# Patient Record
Sex: Female | Born: 1935 | Race: White | Hispanic: No | Marital: Married | Smoking: Never smoker
Health system: Southern US, Community
[De-identification: ages and names within clinical notes are randomized; demographics above are authoritative.]

## PROBLEM LIST (undated history)

## (undated) DIAGNOSIS — Z8719 Personal history of other diseases of the digestive system: Secondary | ICD-10-CM

## (undated) DIAGNOSIS — J45909 Unspecified asthma, uncomplicated: Secondary | ICD-10-CM

## (undated) DIAGNOSIS — J449 Chronic obstructive pulmonary disease, unspecified: Secondary | ICD-10-CM

## (undated) DIAGNOSIS — K219 Gastro-esophageal reflux disease without esophagitis: Secondary | ICD-10-CM

## (undated) DIAGNOSIS — C689 Malignant neoplasm of urinary organ, unspecified: Secondary | ICD-10-CM

## (undated) DIAGNOSIS — C801 Malignant (primary) neoplasm, unspecified: Secondary | ICD-10-CM

## (undated) HISTORY — PX: CATARACT EXTRACTION W/ INTRAOCULAR LENS  IMPLANT, BILATERAL: SHX1307

## (undated) HISTORY — PX: ABDOMINAL HYSTERECTOMY: SHX81

## (undated) HISTORY — PX: CHOLECYSTECTOMY: SHX55

## (undated) HISTORY — PX: THYROIDECTOMY: SHX17

## (undated) HISTORY — PX: OTHER SURGICAL HISTORY: SHX169

## (undated) HISTORY — PX: TONSILLECTOMY: SUR1361

## (undated) HISTORY — PX: APPENDECTOMY: SHX54

## (undated) HISTORY — PX: THYROID SURGERY: SHX805

## (undated) HISTORY — PX: COLON SURGERY: SHX602

---

## 1976-03-13 DIAGNOSIS — C801 Malignant (primary) neoplasm, unspecified: Secondary | ICD-10-CM

## 1976-03-13 HISTORY — DX: Malignant (primary) neoplasm, unspecified: C80.1

## 2012-06-13 ENCOUNTER — Ambulatory Visit: Payer: Self-pay | Admitting: Internal Medicine

## 2012-07-22 ENCOUNTER — Ambulatory Visit: Payer: Self-pay | Admitting: Unknown Physician Specialty

## 2013-04-30 ENCOUNTER — Emergency Department: Payer: Self-pay | Admitting: Emergency Medicine

## 2013-04-30 LAB — CBC
HCT: 39.1 % (ref 35.0–47.0)
HGB: 12.9 g/dL (ref 12.0–16.0)
MCH: 30.8 pg (ref 26.0–34.0)
MCHC: 33.1 g/dL (ref 32.0–36.0)
MCV: 93 fL (ref 80–100)
Platelet: 229 10*3/uL (ref 150–440)
RBC: 4.2 10*6/uL (ref 3.80–5.20)
RDW: 12.2 % (ref 11.5–14.5)
WBC: 7.6 10*3/uL (ref 3.6–11.0)

## 2013-04-30 LAB — BASIC METABOLIC PANEL
ANION GAP: 4 — AB (ref 7–16)
BUN: 25 mg/dL — AB (ref 7–18)
CREATININE: 1.01 mg/dL (ref 0.60–1.30)
Calcium, Total: 8.9 mg/dL (ref 8.5–10.1)
Chloride: 107 mmol/L (ref 98–107)
Co2: 29 mmol/L (ref 21–32)
EGFR (Non-African Amer.): 50 — ABNORMAL LOW
GFR CALC AF AMER: 58 — AB
Glucose: 126 mg/dL — ABNORMAL HIGH (ref 65–99)
OSMOLALITY: 285 (ref 275–301)
Potassium: 3.8 mmol/L (ref 3.5–5.1)
SODIUM: 140 mmol/L (ref 136–145)

## 2013-04-30 LAB — TROPONIN I: Troponin-I: <0.02 ng/mL

## 2014-03-18 ENCOUNTER — Ambulatory Visit: Payer: Self-pay | Admitting: Physician Assistant

## 2014-07-20 ENCOUNTER — Other Ambulatory Visit: Payer: Self-pay | Admitting: Internal Medicine

## 2014-07-20 DIAGNOSIS — R600 Localized edema: Secondary | ICD-10-CM

## 2014-07-24 ENCOUNTER — Ambulatory Visit
Admission: RE | Admit: 2014-07-24 | Discharge: 2014-07-24 | Disposition: A | Discharge status: Home / Self Care | Payer: Medicare Other | Source: Ambulatory Visit | Attending: Internal Medicine | Admitting: Internal Medicine

## 2014-07-24 DIAGNOSIS — R600 Localized edema: Secondary | ICD-10-CM | POA: Diagnosis present

## 2015-02-22 ENCOUNTER — Emergency Department: Payer: Medicare Other

## 2015-02-22 ENCOUNTER — Emergency Department
Admission: EM | Admit: 2015-02-22 | Discharge: 2015-02-22 | Disposition: A | Discharge status: Home / Self Care | Payer: Medicare Other | Attending: Emergency Medicine | Admitting: Emergency Medicine

## 2015-02-22 ENCOUNTER — Encounter: Payer: Self-pay | Admitting: Emergency Medicine

## 2015-02-22 DIAGNOSIS — J449 Chronic obstructive pulmonary disease, unspecified: Secondary | ICD-10-CM | POA: Insufficient documentation

## 2015-02-22 DIAGNOSIS — J329 Chronic sinusitis, unspecified: Secondary | ICD-10-CM | POA: Diagnosis not present

## 2015-02-22 DIAGNOSIS — R51 Headache: Secondary | ICD-10-CM | POA: Diagnosis present

## 2015-02-22 DIAGNOSIS — N3090 Cystitis, unspecified without hematuria: Secondary | ICD-10-CM

## 2015-02-22 DIAGNOSIS — J40 Bronchitis, not specified as acute or chronic: Secondary | ICD-10-CM

## 2015-02-22 HISTORY — DX: Unspecified asthma, uncomplicated: J45.909

## 2015-02-22 HISTORY — DX: Gastro-esophageal reflux disease without esophagitis: K21.9

## 2015-02-22 HISTORY — DX: Chronic obstructive pulmonary disease, unspecified: J44.9

## 2015-02-22 LAB — COMPREHENSIVE METABOLIC PANEL
ALT: 20 U/L (ref 14–54)
ANION GAP: 7 (ref 5–15)
AST: 24 U/L (ref 15–41)
Albumin: 3.5 g/dL (ref 3.5–5.0)
Alkaline Phosphatase: 102 U/L (ref 38–126)
BUN: 22 mg/dL — ABNORMAL HIGH (ref 6–20)
CHLORIDE: 102 mmol/L (ref 101–111)
CO2: 25 mmol/L (ref 22–32)
Calcium: 8.4 mg/dL — ABNORMAL LOW (ref 8.9–10.3)
Creatinine, Ser: 0.9 mg/dL (ref 0.44–1.00)
GFR, EST NON AFRICAN AMERICAN: 55 mL/min — AB (ref >60)
Glucose, Bld: 122 mg/dL — ABNORMAL HIGH (ref 65–99)
POTASSIUM: 3.8 mmol/L (ref 3.5–5.1)
SODIUM: 134 mmol/L — AB (ref 135–145)
Total Bilirubin: 0.6 mg/dL (ref 0.3–1.2)
Total Protein: 6.8 g/dL (ref 6.5–8.1)

## 2015-02-22 LAB — URINALYSIS COMPLETE WITH MICROSCOPIC (ARMC ONLY)
BILIRUBIN URINE: NEGATIVE
Glucose, UA: NEGATIVE mg/dL
Nitrite: NEGATIVE
PH: 5 (ref 5.0–8.0)
PROTEIN: 30 mg/dL — AB
Specific Gravity, Urine: 1.021 (ref 1.005–1.030)

## 2015-02-22 LAB — CBC
HEMATOCRIT: 37.9 % (ref 35.0–47.0)
HEMOGLOBIN: 12.7 g/dL (ref 12.0–16.0)
MCH: 30.5 pg (ref 26.0–34.0)
MCHC: 33.5 g/dL (ref 32.0–36.0)
MCV: 91.3 fL (ref 80.0–100.0)
PLATELETS: 231 10*3/uL (ref 150–440)
RBC: 4.15 MIL/uL (ref 3.80–5.20)
RDW: 12.3 % (ref 11.5–14.5)
WBC: 16.8 10*3/uL — AB (ref 3.6–11.0)

## 2015-02-22 MED ORDER — AMOXICILLIN-POT CLAVULANATE 875-125 MG PO TABS
1.0000 | ORAL_TABLET | Freq: Once | ORAL | Status: AC
  Administered 2015-02-22: 1 via ORAL
  Filled 2015-02-22: qty 1

## 2015-02-22 MED ORDER — AMOXICILLIN-POT CLAVULANATE 875-125 MG PO TABS: 1.0000 | ORAL_TABLET | Freq: Two times a day (BID) | ORAL | Status: AC

## 2015-02-22 NOTE — ED Notes (Signed)
Applied face mask.

## 2015-02-22 NOTE — ED Provider Notes (Addendum)
[facility] Emergency Department Provider Note     Time seen: ----------------------------------------- 6:57 PM on 02/22/2015 -----------------------------------------    I have reviewed the triage vital signs and the nursing notes.   HISTORY  Chief Complaint Headache; Nasal Congestion; Abdominal Pain; and Shortness of Breath    HPI Kristi Thompson is a 81 y.o. female who presents to ER with fever, chills, cough, mild congestion, epigastric pain and mainly headache. Her symptoms been going on for several days, nothing makes it better or worse. She's recently placed on Zyrtec, she denies a history of this in the past. She had an episode last night where she was a little disoriented she states. Currently the symptoms have resolved.   Past Medical History  Diagnosis Date  . Asthma   . GERD (gastroesophageal reflux disease)   . COPD (chronic obstructive pulmonary disease) (HCC)     There are no active problems to display for this patient.   Past Surgical History  Procedure Laterality Date  . Abdominal hysterectomy    . Bowel obstruction    . Colon surgery      colon cancer    Allergies Demerol and Sulfa antibiotics  Social History Social History  Substance Use Topics  . Smoking status: Never Smoker   . Smokeless tobacco: None  . Alcohol Use: Yes    Review of Systems Constitutional: Positive for fever and chills. Eyes: Negative for visual changes. ENT: Positive for congestion Cardiovascular: Negative for chest pain. Respiratory: Negative for shortness of breath. Positive for cough Gastrointestinal: Negative for abdominal pain, vomiting and diarrhea. Genitourinary: Negative for dysuria. Musculoskeletal: Negative for back pain. Skin: Negative for rash. Neurological: Positive for headache  10-point ROS otherwise negative.  ____________________________________________   PHYSICAL EXAM:  VITAL SIGNS: ED Triage Vitals  Enc Vitals  Group     BP 02/22/15 1717 135/57 mmHg     Pulse Rate 02/22/15 1717 85     Resp 02/22/15 1717 18     Temp 02/22/15 1717 98.8 F (37.1 C)     Temp Source 02/22/15 1717 Oral     SpO2 02/22/15 1717 94 %     Weight 02/22/15 1717 144 lb (65.318 kg)     Height 02/22/15 1717 5\' 1"  (1.549 m)     Head Cir --      Peak Flow --      Pain Score --      Pain Loc --      Pain Edu? --      Excl. in [place]? --     Constitutional: Alert and oriented. Well appearing and in no distress. Eyes: Conjunctivae are normal. PERRL. Normal extraocular movements. ENT   Head: Normocephalic and atraumatic.   Nose: No congestion/rhinnorhea. Mild tenderness over the maxillary sinuses   Mouth/Throat: Mucous membranes are moist.   Neck: No stridor. Cardiovascular: Normal rate, regular rhythm. Normal and symmetric distal pulses are present in all extremities. No murmurs, rubs, or gallops. Respiratory: Normal respiratory effort without tachypnea nor retractions. Breath sounds are clear and equal bilaterally. No wheezes/rales/rhonchi. Gastrointestinal: Soft and nontender. No distention. No abdominal bruits.  Musculoskeletal: Nontender with normal range of motion in all extremities. No joint effusions.  No lower extremity tenderness nor edema. Neurologic:  Normal speech and language. No gross focal neurologic deficits are appreciated. Speech is normal. No gait instability. Skin:  Skin is warm, dry and intact. No rash noted. Psychiatric: Mood and affect are normal. Speech and behavior are normal. Patient exhibits appropriate  insight and judgment. ____________________________________________  ED COURSE:  Pertinent labs & imaging results that were available during my care of the patient were reviewed by me and considered in my medical decision making (see chart for details). She is in no acute distress, likely URI or sinusitis. Patient may need CT imaging ____________________________________________    LABS  (pertinent positives/negatives)  Labs Reviewed  COMPREHENSIVE METABOLIC PANEL - Abnormal; Notable for the following:    Sodium 134 (*)    Glucose, Bld 122 (*)    BUN 22 (*)    Calcium 8.4 (*)    GFR calc non Af Amer 55 (*)    All other components within normal limits  CBC - Abnormal; Notable for the following:    WBC 16.8 (*)    All other components within normal limits  URINALYSIS COMPLETEWITH MICROSCOPIC ([facility] ONLY) - Abnormal; Notable for the following:    Color, Urine YELLOW (*)    APPearance CLOUDY (*)    Ketones, ur TRACE (*)    Hgb urine dipstick 2+ (*)    Protein, ur 30 (*)    Leukocytes, UA 1+ (*)    Bacteria, UA RARE (*)    Squamous Epithelial / LPF 6-30 (*)    All other components within normal limits   EKG: Interpreted by me, normal sinus rhythm, incomplete right bundle branch block, nonspecific ST and T wave changes, borderline long QT. Otherwise unremarkable.  RADIOLOGY Images were viewed by me  CT head IMPRESSION: No acute intracranial abnormality. IMPRESSION: Central interstitial changes and peribronchial changes suggesting bronchitis. Linear atelectasis in the left lung base with fluid or thickened pleura in the left costophrenic angle. No focal consolidation. ____________________________________________  FINAL ASSESSMENT AND PLAN  Sinusitis, bronchitis, cystitis  Plan: Patient with labs and imaging as dictated above. Patient be started on Augmentin which should cover the infections listed above. She is encouraged to have follow-up with Dr. Wilburt Finlay 2 days for recheck. She is remarkably healthy for an 81 year old and agrees to outpatient follow-up.   Earleen Newport, MD   Earleen Newport, MD 02/22/15 Lattie Corns  Earleen Newport, MD 02/22/15 [phone]

## 2015-02-22 NOTE — Discharge Instructions (Signed)
Sinusitis, Adult Sinusitis is redness, soreness, and puffiness (inflammation) of the air pockets in the bones of your face (sinuses). The redness, soreness, and puffiness can cause air and mucus to get trapped in your sinuses. This can allow germs to grow and cause an infection.  HOME CARE   Drink enough fluids to keep your pee (urine) clear or pale yellow.  Use a humidifier in your home.  Run a hot shower to create steam in the bathroom. Sit in the bathroom with the door closed. Breathe in the steam 3-4 times a day.  Put a warm, moist washcloth on your face 3-4 times a day, or as told by your doctor.  Use salt water sprays (saline sprays) to wet the thick fluid in your nose. This can help the sinuses drain.  Only take medicine as told by your doctor. GET HELP RIGHT AWAY IF:   Your pain gets worse.  You have very bad headaches.  You are sick to your stomach (nauseous).  You throw up (vomit).  You are very sleepy (drowsy) all the time.  Your face is puffy (swollen).  Your vision changes.  You have a stiff neck.  You have trouble breathing. MAKE SURE YOU:   Understand these instructions.  Will watch your condition.  Will get help right away if you are not doing well or get worse.   This information is not intended to replace advice given to you by your health care provider. Make sure you discuss any questions you have with your health care provider.   Document Released: 08/16/2007 Document Revised: 03/20/2014 Document Reviewed: 10/03/2011 Elsevier Interactive Patient Education 2016 Elsevier Inc. Urinary Tract Infection Urinary tract infections (UTIs) can develop anywhere along your urinary tract. Your urinary tract is your body's drainage system for removing wastes and extra water. Your urinary tract includes two kidneys, two ureters, a bladder, and a urethra. Your kidneys are a pair of bean-shaped organs. Each kidney is about the size of your fist. They are located  below your ribs, one on each side of your spine. CAUSES Infections are caused by microbes, which are microscopic organisms, including fungi, viruses, and bacteria. These organisms are so small that they can only be seen through a microscope. Bacteria are the microbes that most commonly cause UTIs. SYMPTOMS  Symptoms of UTIs may vary by age and gender of the patient and by the location of the infection. Symptoms in young women typically include a frequent and intense urge to urinate and a painful, burning feeling in the bladder or urethra during urination. Older women and men are more likely to be tired, shaky, and weak and have muscle aches and abdominal pain. A fever may mean the infection is in your kidneys. Other symptoms of a kidney infection include pain in your back or sides below the ribs, nausea, and vomiting. DIAGNOSIS To diagnose a UTI, your caregiver will ask you about your symptoms. Your caregiver will also ask you to provide a urine sample. The urine sample will be tested for bacteria and white blood cells. White blood cells are made by your body to help fight infection. TREATMENT  Typically, UTIs can be treated with medication. Because most UTIs are caused by a bacterial infection, they usually can be treated with the use of antibiotics. The choice of antibiotic and length of treatment depend on your symptoms and the type of bacteria causing your infection. HOME CARE INSTRUCTIONS  If you were prescribed antibiotics, take them exactly as your caregiver instructs  you. Elizebeth Koller the medication even if you feel better after you have only taken some of the medication.  Drink enough water and fluids to keep your urine clear or pale yellow.  Avoid caffeine, tea, and carbonated beverages. They tend to irritate your bladder.  Empty your bladder often. Avoid holding urine for long periods of time.  Empty your bladder before and after sexual intercourse.  After a bowel movement, women should  cleanse from front to back. Use each tissue only once. SEEK MEDICAL CARE IF:   You have back pain.  You develop a fever.  Your symptoms do not begin to resolve within 3 days. SEEK IMMEDIATE MEDICAL CARE IF:   You have severe back pain or lower abdominal pain.  You develop chills.  You have nausea or vomiting.  You have continued burning or discomfort with urination. MAKE SURE YOU:   Understand these instructions.  Will watch your condition.  Will get help right away if you are not doing well or get worse.   This information is not intended to replace advice given to you by your health care provider. Make sure you discuss any questions you have with your health care provider.   Document Released: 12/07/2004 Document Revised: 11/18/2014 Document Reviewed: 04/07/2011 Elsevier Interactive Patient Education 2016 Elsevier Inc. Acute Bronchitis Bronchitis is inflammation of the airways that extend from the windpipe into the lungs (bronchi). The inflammation often causes mucus to develop. This leads to a cough, which is the most common symptom of bronchitis.  In acute bronchitis, the condition usually develops suddenly and goes away over time, usually in a couple weeks. Smoking, allergies, and asthma can make bronchitis worse. Repeated episodes of bronchitis may cause further lung problems.  CAUSES Acute bronchitis is most often caused by the same virus that causes a cold. The virus can spread from person to person (contagious) through coughing, sneezing, and touching contaminated objects. SIGNS AND SYMPTOMS   Cough.   Fever.   Coughing up mucus.   Body aches.   Chest congestion.   Chills.   Shortness of breath.   Sore throat.  DIAGNOSIS  Acute bronchitis is usually diagnosed through a physical exam. Your health care provider will also ask you questions about your medical history. Tests, such as chest X-rays, are sometimes done to rule out other conditions.    TREATMENT  Acute bronchitis usually goes away in a couple weeks. Oftentimes, no medical treatment is necessary. Medicines are sometimes given for relief of fever or cough. Antibiotic medicines are usually not needed but may be prescribed in certain situations. In some cases, an inhaler may be recommended to help reduce shortness of breath and control the cough. A cool mist vaporizer may also be used to help thin bronchial secretions and make it easier to clear the chest.  HOME CARE INSTRUCTIONS  Get plenty of rest.   Drink enough fluids to keep your urine clear or pale yellow (unless you have a medical condition that requires fluid restriction). Increasing fluids may help thin your respiratory secretions (sputum) and reduce chest congestion, and it will prevent dehydration.   Take medicines only as directed by your health care provider.  If you were prescribed an antibiotic medicine, finish it all even if you start to feel better.  Avoid smoking and secondhand smoke. Exposure to cigarette smoke or irritating chemicals will make bronchitis worse. If you are a smoker, consider using nicotine gum or skin patches to help control withdrawal symptoms. Quitting smoking will help  your lungs heal faster.   Reduce the chances of another bout of acute bronchitis by washing your hands frequently, avoiding people with cold symptoms, and trying not to touch your hands to your mouth, nose, or eyes.   Keep all follow-up visits as directed by your health care provider.  SEEK MEDICAL CARE IF: Your symptoms do not improve after 1 week of treatment.  SEEK IMMEDIATE MEDICAL CARE IF:  You develop an increased fever or chills.   You have chest pain.   You have severe shortness of breath.  You have bloody sputum.   You develop dehydration.  You faint or repeatedly feel like you are going to pass out.  You develop repeated vomiting.  You develop a severe headache. MAKE SURE YOU:   Understand  these instructions.  Will watch your condition.  Will get help right away if you are not doing well or get worse.   This information is not intended to replace advice given to you by your health care provider. Make sure you discuss any questions you have with your health care provider.   Document Released: 04/06/2004 Document Revised: 03/20/2014 Document Reviewed: 08/20/2012 Elsevier Interactive Patient Education [employer].

## 2015-02-22 NOTE — ED Notes (Signed)
Pt presents to ER headache, pain near sternum where hernia is. Pt has chills, dry cough, intermittent fevers since yesterday afternoon. Facial tenderness.Pt alert and oriented.

## 2015-10-11 DIAGNOSIS — C44310 Basal cell carcinoma of skin of unspecified parts of face: Secondary | ICD-10-CM | POA: Insufficient documentation

## 2016-07-25 ENCOUNTER — Other Ambulatory Visit: Payer: Self-pay | Admitting: Physician Assistant

## 2016-07-25 DIAGNOSIS — J40 Bronchitis, not specified as acute or chronic: Secondary | ICD-10-CM

## 2016-07-25 DIAGNOSIS — J431 Panlobular emphysema: Secondary | ICD-10-CM

## 2017-03-13 DIAGNOSIS — C689 Malignant neoplasm of urinary organ, unspecified: Secondary | ICD-10-CM

## 2017-03-13 HISTORY — DX: Malignant neoplasm of urinary organ, unspecified: C68.9

## 2017-05-23 DIAGNOSIS — N183 Chronic kidney disease, stage 3 unspecified: Secondary | ICD-10-CM | POA: Insufficient documentation

## 2017-12-19 DIAGNOSIS — I10 Essential (primary) hypertension: Secondary | ICD-10-CM | POA: Insufficient documentation

## 2017-12-20 ENCOUNTER — Other Ambulatory Visit: Payer: Self-pay | Admitting: Internal Medicine

## 2017-12-20 DIAGNOSIS — R1032 Left lower quadrant pain: Secondary | ICD-10-CM

## 2017-12-27 ENCOUNTER — Other Ambulatory Visit: Payer: Self-pay | Admitting: Internal Medicine

## 2017-12-27 ENCOUNTER — Ambulatory Visit
Admission: RE | Admit: 2017-12-27 | Discharge: 2017-12-27 | Disposition: A | Discharge status: Home / Self Care | Payer: Medicare Other | Source: Ambulatory Visit | Attending: Internal Medicine | Admitting: Internal Medicine

## 2017-12-27 DIAGNOSIS — R1032 Left lower quadrant pain: Secondary | ICD-10-CM

## 2017-12-27 DIAGNOSIS — N131 Hydronephrosis with ureteral stricture, not elsewhere classified: Secondary | ICD-10-CM | POA: Diagnosis not present

## 2017-12-27 DIAGNOSIS — R590 Localized enlarged lymph nodes: Secondary | ICD-10-CM | POA: Diagnosis not present

## 2017-12-27 HISTORY — DX: Malignant (primary) neoplasm, unspecified: C80.1

## 2017-12-27 LAB — POCT I-STAT CREATININE: CREATININE: 1.7 mg/dL — AB (ref 0.44–1.00)

## 2017-12-31 ENCOUNTER — Other Ambulatory Visit: Payer: Self-pay | Admitting: Student

## 2017-12-31 DIAGNOSIS — R933 Abnormal findings on diagnostic imaging of other parts of digestive tract: Secondary | ICD-10-CM

## 2018-01-04 ENCOUNTER — Ambulatory Visit
Admission: RE | Admit: 2018-01-04 | Discharge: 2018-01-04 | Disposition: A | Discharge status: Home / Self Care | Payer: Medicare Other | Source: Ambulatory Visit | Attending: Student | Admitting: Student

## 2018-01-04 DIAGNOSIS — R933 Abnormal findings on diagnostic imaging of other parts of digestive tract: Secondary | ICD-10-CM | POA: Insufficient documentation

## 2018-01-04 DIAGNOSIS — J90 Pleural effusion, not elsewhere classified: Secondary | ICD-10-CM | POA: Diagnosis not present

## 2018-01-04 DIAGNOSIS — N131 Hydronephrosis with ureteral stricture, not elsewhere classified: Secondary | ICD-10-CM | POA: Diagnosis not present

## 2018-01-04 DIAGNOSIS — I2510 Atherosclerotic heart disease of native coronary artery without angina pectoris: Secondary | ICD-10-CM | POA: Diagnosis not present

## 2018-01-04 DIAGNOSIS — I517 Cardiomegaly: Secondary | ICD-10-CM | POA: Insufficient documentation

## 2018-01-04 DIAGNOSIS — D4959 Neoplasm of unspecified behavior of other genitourinary organ: Secondary | ICD-10-CM | POA: Insufficient documentation

## 2018-01-04 DIAGNOSIS — J320 Chronic maxillary sinusitis: Secondary | ICD-10-CM | POA: Diagnosis not present

## 2018-01-04 DIAGNOSIS — I700 Atherosclerosis of aorta: Secondary | ICD-10-CM | POA: Diagnosis not present

## 2018-01-04 DIAGNOSIS — R590 Localized enlarged lymph nodes: Secondary | ICD-10-CM | POA: Diagnosis not present

## 2018-01-04 DIAGNOSIS — E0789 Other specified disorders of thyroid: Secondary | ICD-10-CM | POA: Diagnosis not present

## 2018-01-04 DIAGNOSIS — R935 Abnormal findings on diagnostic imaging of other abdominal regions, including retroperitoneum: Secondary | ICD-10-CM | POA: Insufficient documentation

## 2018-01-04 DIAGNOSIS — K6389 Other specified diseases of intestine: Secondary | ICD-10-CM | POA: Diagnosis not present

## 2018-01-04 DIAGNOSIS — B9689 Other specified bacterial agents as the cause of diseases classified elsewhere: Secondary | ICD-10-CM | POA: Insufficient documentation

## 2018-01-04 MED ORDER — FLUDEOXYGLUCOSE F - 18 (FDG) INJECTION
7.5000 | Freq: Once | INTRAVENOUS | Status: AC | PRN
  Administered 2018-01-04: 7.4 via INTRAVENOUS

## 2018-01-07 LAB — GLUCOSE, CAPILLARY: Glucose-Capillary: 105 mg/dL — ABNORMAL HIGH (ref 70–99)

## 2018-01-10 ENCOUNTER — Inpatient Hospital Stay: Payer: Medicare Other

## 2018-01-10 ENCOUNTER — Telehealth: Payer: Self-pay

## 2018-01-10 ENCOUNTER — Inpatient Hospital Stay: Payer: Medicare Other | Attending: Oncology | Admitting: Oncology

## 2018-01-10 ENCOUNTER — Other Ambulatory Visit: Payer: Self-pay

## 2018-01-10 VITALS — BP 137/79 | HR 71 | Temp 98.1°F | Resp 20 | Wt 138.8 lb

## 2018-01-10 DIAGNOSIS — K219 Gastro-esophageal reflux disease without esophagitis: Secondary | ICD-10-CM | POA: Insufficient documentation

## 2018-01-10 DIAGNOSIS — R634 Abnormal weight loss: Secondary | ICD-10-CM | POA: Diagnosis not present

## 2018-01-10 DIAGNOSIS — R1900 Intra-abdominal and pelvic swelling, mass and lump, unspecified site: Secondary | ICD-10-CM | POA: Diagnosis not present

## 2018-01-10 DIAGNOSIS — R590 Localized enlarged lymph nodes: Secondary | ICD-10-CM | POA: Diagnosis not present

## 2018-01-10 DIAGNOSIS — Z800 Family history of malignant neoplasm of digestive organs: Secondary | ICD-10-CM

## 2018-01-10 DIAGNOSIS — J320 Chronic maxillary sinusitis: Secondary | ICD-10-CM

## 2018-01-10 DIAGNOSIS — K6389 Other specified diseases of intestine: Secondary | ICD-10-CM

## 2018-01-10 DIAGNOSIS — I2510 Atherosclerotic heart disease of native coronary artery without angina pectoris: Secondary | ICD-10-CM | POA: Diagnosis not present

## 2018-01-10 DIAGNOSIS — R109 Unspecified abdominal pain: Secondary | ICD-10-CM | POA: Diagnosis not present

## 2018-01-10 DIAGNOSIS — Z79899 Other long term (current) drug therapy: Secondary | ICD-10-CM | POA: Diagnosis not present

## 2018-01-10 DIAGNOSIS — Z7982 Long term (current) use of aspirin: Secondary | ICD-10-CM | POA: Insufficient documentation

## 2018-01-10 DIAGNOSIS — Z85038 Personal history of other malignant neoplasm of large intestine: Secondary | ICD-10-CM | POA: Diagnosis not present

## 2018-01-10 DIAGNOSIS — J449 Chronic obstructive pulmonary disease, unspecified: Secondary | ICD-10-CM | POA: Diagnosis not present

## 2018-01-10 DIAGNOSIS — N131 Hydronephrosis with ureteral stricture, not elsewhere classified: Secondary | ICD-10-CM | POA: Insufficient documentation

## 2018-01-10 NOTE — Telephone Encounter (Addendum)
Received call from [facility]. Imaging and labs reviewed/discussed. We can certainly see Kristi Thompson in consult. [name] does not have any availability for Colonoscopy until January. They have given permission to have colonoscopy performed at another facility quicker if this is what she needs for further workup to guide treatment. They will send referral and I will follow from navigation.  10/21 CEA 8.3 10/21 CA 125 24.6 Oncology Nurse Navigator Documentation  Navigator Location: [facility] (01/10/18 0800)   )Navigator Encounter Type: Telephone (01/10/18 0800) Telephone: Incoming Call (01/10/18 0800)                                                   Time Spent with Patient: 15 (01/10/18 0800)

## 2018-01-10 NOTE — Progress Notes (Signed)
Met with Ms. Vandiver and her daughter and grandaughter before and during consult with Dr. Grayland Ormond. Introduced Therapist, nutritional and provided contact information for future needs. Oncology Nurse Navigator Documentation  Navigator Location: [facility] (01/10/18 1400)   )Navigator Encounter Type: Initial [facility] (01/10/18 1400)                                                    Time Spent with Patient: 30 (01/10/18 1400)

## 2018-01-10 NOTE — Progress Notes (Signed)
Patient here today for new evaluation regarding colon cancer.

## 2018-01-11 ENCOUNTER — Telehealth: Payer: Self-pay

## 2018-01-11 LAB — CEA: CEA: 8.4 ng/mL — ABNORMAL HIGH (ref 0.0–4.7)

## 2018-01-11 NOTE — Telephone Encounter (Signed)
Called and spoke with [facility] to see if they needed to see her in the office first or if they could proceed with colonoscopy. They stated they needed her to have an office visit. Called and updated daughter, Shayana, that they do need to see her first. We will be back in touch when biopsy is arranged. Oncology Nurse Navigator Documentation  Navigator Location: [facility] (01/11/18 1400)   )Navigator Encounter Type: Telephone (01/11/18 1400) Telephone: [place] Call (01/11/18 1400)                                                  Time Spent with Patient: 15 (01/11/18 1400)

## 2018-01-11 NOTE — Progress Notes (Signed)
[facility]  [phone] [phone]) [phone]  ID: Kristi Thompson OB: [birth year]  MR#: [id number]  DJM#:[id number]  Patient Care Team: [name], MD as PCP - General (Internal Medicine) Clent Jacks, RN as Registered Nurse  CHIEF COMPLAINT: Abdominal mass  INTERVAL HISTORY: Patient is a 84 year old female who was in her normal state of health 3 weeks ago until she started developing abdominal pain.  Subsequent work-up included CT scan and PET scan revealing transverse colon abdominal mass as well as lymphadenopathy.  She has had some mild weight loss over the same timeframe, but attributes this to a decrease in appetite.  She otherwise feels well.  She has no neurologic complaints.  She denies any fevers or night sweats.  She has no chest pain, shortness of breath, cough, or hemoptysis.  She denies any nausea, vomiting, constipation, or diarrhea.  She has noted no changes in her bowel movements.  She denies any melena or hematochezia.  She has no urinary complaints.  Patient otherwise feels well and offers no further specific complaints.  REVIEW OF SYSTEMS:   Review of Systems  Constitutional: Negative.  Negative for fever, malaise/fatigue and weight loss.  Respiratory: Negative.  Negative for cough, hemoptysis and shortness of breath.   Cardiovascular: Negative.  Negative for chest pain and leg swelling.  Gastrointestinal: Positive for abdominal pain. Negative for blood in stool, constipation, diarrhea, melena, nausea and vomiting.  Genitourinary: Negative.  Negative for dysuria and hematuria.  Musculoskeletal: Negative.  Negative for back pain.  Skin: Negative.  Negative for rash.  Neurological: Negative.  Negative for dizziness, focal weakness, weakness and headaches.  Psychiatric/Behavioral: Negative.  The patient is not nervous/anxious.     As per HPI. Otherwise, a complete review of systems is negative.  PAST MEDICAL HISTORY: Past Medical  History:  Diagnosis Date  . Asthma   . Cancer ([place])    colon ca  . COPD (chronic obstructive pulmonary disease) ([place])   . GERD (gastroesophageal reflux disease)     PAST SURGICAL HISTORY: Past Surgical History:  Procedure Laterality Date  . ABDOMINAL HYSTERECTOMY    . bowel obstruction    . COLON SURGERY     colon cancer    FAMILY HISTORY: Negative and noncontributory. No family history on file.  ADVANCED DIRECTIVES (Y/N):  N  HEALTH MAINTENANCE: Social History   Tobacco Use  . Smoking status: Never Smoker  Substance Use Topics  . Alcohol use: Yes  . Drug use: No     Colonoscopy:  PAP:  Bone density:  Lipid panel:  Allergies  Allergen Reactions  . Demerol [Meperidine] Swelling  . Sulfa Antibiotics Swelling    Current Outpatient Medications  Medication Sig Dispense Refill  . albuterol (PROVENTIL HFA;VENTOLIN HFA) 108 (90 Base) MCG/ACT inhaler Inhale into the lungs.    . budesonide (PULMICORT FLEXHALER) 180 MCG/ACT inhaler Inhale into the lungs.    . Cyanocobalamin 2500 MCG SUBL Place under the tongue.    . famotidine (PEPCID) 40 MG tablet Take by mouth.    . fluticasone (FLONASE) 50 MCG/ACT nasal spray USE 2 SPRAYS INTO BOTH     NOSTRILS NIGHTLY    . HYDROcodone-acetaminophen (NORCO/VICODIN) 5-325 MG tablet TK 1 T PO Q 6 H PRN  0  . montelukast (SINGULAIR) 10 MG tablet TAKE 1 TABLET DAILY    . Multiple Vitamin (MULTI-VITAMINS) TABS Take by mouth.    . tamsulosin (FLOMAX) 0.4 MG CAPS capsule Take by mouth.    Marland Kitchen aspirin  EC 81 MG tablet Take by mouth.     No current facility-administered medications for this visit.     OBJECTIVE: Vitals:   01/10/18 1353  BP: 137/79  Pulse: 71  Resp: 20  Temp: 98.1 F (36.7 C)     Body mass index is 26.23 kg/m.    ECOG FS:0 - Asymptomatic  General: Well-developed, well-nourished, no acute distress. Eyes: Pink conjunctiva, anicteric sclera. HEENT: Normocephalic, moist mucous membranes, clear oropharnyx. Lungs: Clear  to auscultation bilaterally. Heart: Regular rate and rhythm. No rubs, murmurs, or gallops. Abdomen: Soft, nontender, nondistended. No organomegaly noted, normoactive bowel sounds. Musculoskeletal: No edema, cyanosis, or clubbing. Neuro: Alert, answering all questions appropriately. Cranial nerves grossly intact. Skin: No rashes or petechiae noted. Psych: Normal affect. Lymphatics: No cervical, calvicular, axillary or inguinal LAD.   LAB RESULTS:  Lab Results  Component Value Date   NA 134 (L) 02/22/2015   K 3.8 02/22/2015   CL 102 02/22/2015   CO2 25 02/22/2015   GLUCOSE 122 (H) 02/22/2015   BUN 22 (H) 02/22/2015   CREATININE 1.70 (H) 12/27/2017   CALCIUM 8.4 (L) 02/22/2015   PROT 6.8 02/22/2015   ALBUMIN 3.5 02/22/2015   AST 24 02/22/2015   ALT 20 02/22/2015   ALKPHOS 102 02/22/2015   BILITOT 0.6 02/22/2015   GFRNONAA 55 (L) 02/22/2015   GFRAA >60 02/22/2015    Lab Results  Component Value Date   WBC 16.8 (H) 02/22/2015   HGB 12.7 02/22/2015   HCT 37.9 02/22/2015   MCV 91.3 02/22/2015   PLT 231 02/22/2015     STUDIES: Ct Abdomen Pelvis Wo Contrast  Result Date: 12/27/2017 CLINICAL DATA:  Lower abdominal and BILATERAL flank pain for 3 weeks, history of hysterectomy, cholecystectomy, colon resection, appendectomy, colon cancer, COPD, GERD EXAM: CT ABDOMEN AND PELVIS WITHOUT CONTRAST TECHNIQUE: Multidetector CT imaging of the abdomen and pelvis was performed following the standard protocol without IV contrast. Sagittal and coronal MPR images reconstructed from axial data set. Patient drank dilute oral contrast for exam COMPARISON:  None FINDINGS: Lower chest: Minimal atelectasis at LEFT base. Lung bases appear hyperinflated. Hepatobiliary: Liver and gallbladder normal appearance Pancreas: Atrophic pancreas without mass Spleen: Normal appearance Adrenals/Urinary Tract: Adrenal glands and LEFT kidney normal appearance. RIGHT hydronephrosis and proximal to mid ureteral  dilatation appendix surgically absent by history. RIGHT ureter is dilated into the RIGHT pelvis where it terminates at a soft tissue mass, see below. Unremarkable LEFT ureter and bladder. Stomach/Bowel: Stomach unremarkable. Area of soft tissue attenuation at the mid transverse colon suspicious for colonic neoplasm, 3.0 x 4.5 x 5.1 cm. Remaining bowel loops unremarkable Vascular/Lymphatic: Atherosclerotic calcifications aorta and iliac arteries, minimally in coronary arteries. Aorta normal caliber. 18 mm aortocaval lymph node image 40. Additional abnormal soft tissue density in the RIGHT retroperitoneum adjacent to and along the course of the RIGHT ureter suspicious for additional retroperitoneal adenopathy, up to 16 mm short axis image 47. Numerous pelvic phleboliths. Reproductive: Uterus surgically absent. Atrophic LEFT ovary. Soft tissue mass with central calcifications in the RIGHT pelvis could represent an abnormal enlarged RIGHT ovary 3.9 x 3.6 x 2.9 cm versus metastatic disease to the RIGHT ovary versus non lymph node tumor. This is likely causing the observed distal RIGHT ureteral obstruction. Other: No free air or free fluid.  No definite hernia. Musculoskeletal: Bones demineralized with bulging L5-S1 disc. IMPRESSION: Abnormal soft tissue density at the mid transverse colon suspicious for colon cancer, 3.0 x 4.5 x 5.1 cm, consider colonoscopy. Retroperitoneal  adenopathy concerning for metastatic disease/tumor spread, including soft tissue mass in the RIGHT pelvis 3.9 cm greatest size which could represent tumor or metastatic disease to the RIGHT ovary. RIGHT hydronephrosis and RIGHT hydroureter secondary to distal RIGHT ureteral obstruction at the level of the above soft tissue mass in the RIGHT pelvis. Extent of disease could be assessed by PET-CT imaging. Electronically Signed   By: Lavonia Dana M.D.   On: 12/27/2017 14:29   Nm Pet Image Initial (pi) Skull Base To Thigh  Result Date:  01/04/2018 CLINICAL DATA:  Initial treatment strategy for colon cancer. EXAM: NUCLEAR MEDICINE PET SKULL BASE TO THIGH TECHNIQUE: 7.4 mCi F-18 FDG was injected intravenously. Full-ring PET imaging was performed from the skull base to thigh after the radiotracer. CT data was obtained and used for attenuation correction and anatomic localization. Fasting blood glucose: 105 mg/dl COMPARISON:  CT abdomen 12/27/2017 FINDINGS: Mediastinal blood pool activity: SUV max 2.8 NECK: Left facial subcutaneous nodule measuring 1.7 by 1.3 cm sebaceous cyst or similar benign lesion. There is generalized accentuated metabolic activity in the thyroid gland although greater on the right side; the right thyroid lobe is also larger. Right thyroid activity 9.2 in a rather generalized fashion, left thyroid activity 4.3. The thyroid gland had a roughly similar size and contour back earliest available comparison of on 06/13/2012. Incidental CT findings: Mild chronic right maxillary sinusitis. CHEST: Right paraspinal 2.9 by 0.5 cm soft tissue density at the T11 level on the right noted with maximum SUV 5.4. This has a somewhat tubular configuration. Incidental CT findings: Mild biapical pleuroparenchymal scarring. Coronary, aortic arch, and branch vessel atherosclerotic vascular disease. Mild cardiomegaly. Trace right pleural effusion, new compared to 12/27/2017. ABDOMEN/PELVIS: The area of wall thickening in the transverse colon has notably accentuated metabolic activity with maximum SUV 24.8. Retrocaval lymph node 2.1 cm in short axis on image 149/3, maximum SUV 6.4. Aortocaval node measuring 2.0 cm in short axis on image 170/3 has maximum standard uptake value of 5.3. There is soft tissue density in the distended right proximal and mid ureter with accentuated metabolic activity, maximum SUV approximately 9.5, favoring intraureteral tumor. There is right hydronephrosis and poor excretion of radiopharmaceutical into the right collecting  system. A right common iliac node measuring about 1.4 cm in diameter on image 187/3 has a maximum SUV of 10.1. External iliac adenopathy there is accentuated activity within or along the right external iliac vein for example in the vicinity of image 202/3, with maximum SUV 7.1. I cannot exclude tumor thrombus in the iliac vein although this could represent immediately adjacent adenopathy. There is speckled calcifications along a mass in the vicinity of the right distal ureter/vaginal cuff measuring about 3.5 by 3.8 cm on image 210/3. This mass has a maximum SUV of 11.7. Faintly hypermetabolic nodules along the right paracolic gutter on image 329/5, measuring up to 7 mm in individual diameter with maximum SUV 3.2, favoring early peritoneal spread of malignancy. Incidental CT findings: Aortoiliac atherosclerotic vascular disease. Right hydronephrosis due to obstructive tumor. SKELETON: No significant abnormal hypermetabolic activity in this region. Incidental CT findings: none IMPRESSION: 1. Hypermetabolic mass of the transverse colon, with hypermetabolic and pathologic right eccentric retroperitoneal adenopathy, hypermetabolic tumor distending the right proximal and mid ureter, a hypermetabolic mass in the vicinity of the distal ureter and vaginal cuff, and hypermetabolic suspected tumor thrombus in the right external iliac vein in the pelvis. There are also mildly hypermetabolic nodules along the right paracolic gutter compatible with early peritoneal  spread of malignancy, and a small hypermetabolic right anterior paraspinal mass at the T11 level. Appearance compatible with active malignancy. 2. Trace right pleural effusion, new compared to 12/27/2017. 3. Generalized accentuated metabolic activity in the thyroid gland, with the right thyroid lobe larger than the left and significantly more hypermetabolic. Given the similarity in size back through 2014 I suspect that this is probably from thyroiditis rather than  thyroid malignancy, but thyroid ultrasound may be warranted. 4. 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 5. Other imaging findings of potential clinical significance: Chronic right maxillary sinusitis. Aortic Atherosclerosis (ICD10-I70.0). Coronary atherosclerosis with mild cardiomegaly. Electronically Signed   By: Van Clines M.D.   On: 01/04/2018 16:06    ASSESSMENT: Abdominal mass.  PLAN:    1.  Abdominal mass: Unclear etiology, but highly suspicious for malignancy.  Patient has a history of colon cancer greater than 40 years ago, but her CEA is only 8.4 she has no symptoms that suggest colon cancer, but will send a referral to GI for colonoscopy.  CA-125 is within normal limits.  Case also discussed with interventional radiology who feels a CT-guided biopsy should be pursued simultaneously with colonoscopy to obtain diagnosis.  Peripheral blood flow cytometry is pending at time of dictation.  No follow-up is scheduled at this time.  Patient will return to clinic after her colonoscopy and CT-guided biopsy to discuss the results and treatment planning if necessary.  I spent a total of 60 minutes face-to-face with the patient of which greater than 50% of the visit was spent in counseling and coordination of care as detailed above.  Patient expressed understanding and was in agreement with this plan. She also understands that She can call clinic at any time with any questions, concerns, or complaints.   Cancer Staging No matching staging information was found for the patient.  Lloyd Huger, MD   01/11/2018 10:49 AM

## 2018-01-14 ENCOUNTER — Ambulatory Visit: Payer: Medicare Other

## 2018-01-14 ENCOUNTER — Telehealth: Payer: Self-pay | Admitting: *Deleted

## 2018-01-14 ENCOUNTER — Other Ambulatory Visit: Payer: Self-pay | Admitting: *Deleted

## 2018-01-14 DIAGNOSIS — R1900 Intra-abdominal and pelvic swelling, mass and lump, unspecified site: Secondary | ICD-10-CM

## 2018-01-14 NOTE — Telephone Encounter (Signed)
Pts daughter notified of pts appt for ct guided biopsy. Pts daughter verbalized understanding of appointment. Scheduling message sent to have pt scheduled for f/u with Dr. [place] Ormond week of 11/18 for results.

## 2018-01-16 ENCOUNTER — Other Ambulatory Visit: Payer: Self-pay

## 2018-01-16 ENCOUNTER — Encounter: Payer: Self-pay | Admitting: Gastroenterology

## 2018-01-16 ENCOUNTER — Ambulatory Visit (INDEPENDENT_AMBULATORY_CARE_PROVIDER_SITE_OTHER): Payer: Medicare Other | Admitting: Gastroenterology

## 2018-01-16 VITALS — BP 136/78 | HR 76 | Ht 61.0 in | Wt 140.8 lb

## 2018-01-16 DIAGNOSIS — R933 Abnormal findings on diagnostic imaging of other parts of digestive tract: Secondary | ICD-10-CM

## 2018-01-16 NOTE — Progress Notes (Signed)
Jonathon Bellows MD, MRCP(U.K) [address]  [place]  [place],  [address]  Main: [phone]  Fax: [phone]   Gastroenterology Consultation  Referring Provider:     [name], MD Primary Care Physician:  [name], MD Primary Gastroenterologist:  Dr. Jonathon Bellows  Reason for Consultation:     Colon mass         HPI:   Kristi Thompson is a 84 y.o. y/o female referred for consultation & management  by Dr. Doy Hutching, Leonie Douglas, MD.    The patient has been referred to today to be seen for evaluation of a colonoscopy as [facility] do not have any openings until January 2020.Marland Kitchen  She was initially seen by [facility] GI on 12/31/2017 when it was noted on a CT scan of an abdomen on 12/27/2017 which was performed for evaluation of abdominal pain showed a colonic mass in the transverse colon, retroperitoneal adenopathy concerning for metastatic disease and tumor spread.  Soft tissue mass in the right pelvis 3.9 cm which could represent tumor of metastatic disease to the right ovary, right hydronephrosis and right hydroureter secondary to distal right ureteral obstruction at the level of the above soft tissue mass in the right pelvis..  This was followed by PET scan on 01/04/2018 which demonstrated a hypermetabolic mass of the transverse colon with hypermetabolic and pathologic right eccentric retroperitoneal adenopathy, hypermetabolic tumor extending to the right proximal and mid ureter, hypermetabolic mass in the vicinity of the distal ureter and vagina cuff and suspected tumor thrombus in the right external iliac vein in the pelvis.  There are also mildly hypermetabolic nodules along the right paracolic gutters compatible with early peritoneal spread of malignancy.  She has a history of colon cancer status post partial colectomy 1978, history of multiple small bowel obstructions secondary to adhesions.  No weight loss, no rectal bleeding, no change in bowel habits. Hb  12.1 .       Past Medical History:  Diagnosis Date  . Asthma   . Cancer ([place])    colon ca  . COPD (chronic obstructive pulmonary disease) ([place])   . GERD (gastroesophageal reflux disease)     Past Surgical History:  Procedure Laterality Date  . ABDOMINAL HYSTERECTOMY    . bowel obstruction    . COLON SURGERY     colon cancer    Prior to Admission medications   Medication Sig Start Date End Date Taking? Authorizing Provider  albuterol (PROVENTIL HFA;VENTOLIN HFA) 108 (90 Base) MCG/ACT inhaler Inhale into the lungs.   Yes [provider]  aspirin EC 81 MG tablet Take by mouth.   Yes [provider]  budesonide (PULMICORT FLEXHALER) 180 MCG/ACT inhaler Inhale into the lungs. 05/23/17 05/23/18 Yes [provider]  famotidine (PEPCID) 40 MG tablet Take by mouth. 12/19/17 12/19/18 Yes [provider]  fluticasone (FLONASE) 50 MCG/ACT nasal spray USE 2 SPRAYS INTO BOTH     NOSTRILS NIGHTLY 06/13/17  Yes [provider]  montelukast (SINGULAIR) 10 MG tablet TAKE 1 TABLET DAILY 07/16/16  Yes [provider]  Multiple Vitamin (MULTI-VITAMINS) TABS Take by mouth.   Yes [provider]  tamsulosin (FLOMAX) 0.4 MG CAPS capsule Take by mouth.   Yes [provider]  Cyanocobalamin 2500 MCG SUBL Place under the tongue.    [provider]  HYDROcodone-acetaminophen (NORCO/VICODIN) 5-325 MG tablet TK 1 T PO Q 6 H PRN 12/27/17   [provider]  No family history on file.   Social History   Tobacco Use  . Smoking status: Never Smoker  . Smokeless tobacco: Never Used  Substance Use Topics  . Alcohol use: Yes  . Drug use: No    Allergies as of 01/16/2018 - Review Complete 01/16/2018  Allergen Reaction Noted  . Demerol [meperidine] Swelling 02/22/2015  . Sulfa antibiotics Swelling 02/22/2015    Review of Systems:    All systems reviewed and negative except where noted in HPI.   Physical Exam:  BP  136/78   Pulse 76   Ht 5\' 1"  (1.549 m)   Wt 140 lb 12.8 oz (63.9 kg)   BMI 26.60 kg/m  No LMP recorded. Psych:  Alert and cooperative. Normal mood and affect. General:   Alert,  Well-developed, well-nourished, pleasant and cooperative in NAD Head:  Normocephalic and atraumatic. Eyes:  Sclera clear, no icterus.   Conjunctiva pink. Ears:  Normal auditory acuity. Nose:  No deformity, discharge, or lesions. Mouth:  No deformity or lesions,oropharynx pink & moist. Neck:  Supple; no masses or thyromegaly. Lungs:  Respirations even and unlabored.  Clear throughout to auscultation.   No wheezes, crackles, or rhonchi. No acute distress. Heart:  Regular rate and rhythm; no murmurs, clicks, rubs, or gallops. Abdomen:  Normal bowel sounds.  No bruits.  Soft, non-tender and non-distended without masses, hepatosplenomegaly or hernias noted.  No guarding or rebound tenderness.    Msk:  Symmetrical without gross deformities. Good, equal movement & strength bilaterally. Pulses:  Normal pulses noted. Extremities:  No clubbing or edema.  No cyanosis. Neurologic:  Alert and oriented x3;  grossly normal neurologically. Skin:  Intact without significant lesions or rashes. No jaundice. Lymph Nodes:  No significant cervical adenopathy. Psych:  Alert and cooperative. Normal mood and affect.  Imaging Studies: Ct Abdomen Pelvis Wo Contrast  Result Date: 12/27/2017 CLINICAL DATA:  Lower abdominal and BILATERAL flank pain for 3 weeks, history of hysterectomy, cholecystectomy, colon resection, appendectomy, colon cancer, COPD, GERD EXAM: CT ABDOMEN AND PELVIS WITHOUT CONTRAST TECHNIQUE: Multidetector CT imaging of the abdomen and pelvis was performed following the standard protocol without IV contrast. Sagittal and coronal MPR images reconstructed from axial data set. Patient drank dilute oral contrast for exam COMPARISON:  None FINDINGS: Lower chest: Minimal atelectasis at LEFT base. Lung bases appear  hyperinflated. Hepatobiliary: Liver and gallbladder normal appearance Pancreas: Atrophic pancreas without mass Spleen: Normal appearance Adrenals/Urinary Tract: Adrenal glands and LEFT kidney normal appearance. RIGHT hydronephrosis and proximal to mid ureteral dilatation appendix surgically absent by history. RIGHT ureter is dilated into the RIGHT pelvis where it terminates at a soft tissue mass, see below. Unremarkable LEFT ureter and bladder. Stomach/Bowel: Stomach unremarkable. Area of soft tissue attenuation at the mid transverse colon suspicious for colonic neoplasm, 3.0 x 4.5 x 5.1 cm. Remaining bowel loops unremarkable Vascular/Lymphatic: Atherosclerotic calcifications aorta and iliac arteries, minimally in coronary arteries. Aorta normal caliber. 18 mm aortocaval lymph node image 40. Additional abnormal soft tissue density in the RIGHT retroperitoneum adjacent to and along the course of the RIGHT ureter suspicious for additional retroperitoneal adenopathy, up to 16 mm short axis image 47. Numerous pelvic phleboliths. Reproductive: Uterus surgically absent. Atrophic LEFT ovary. Soft tissue mass with central calcifications in the RIGHT pelvis could represent an abnormal enlarged RIGHT ovary 3.9 x 3.6 x 2.9 cm versus metastatic disease to the RIGHT ovary versus non lymph node tumor. This is likely causing the observed distal RIGHT ureteral obstruction. Other: No free air  or free fluid.  No definite hernia. Musculoskeletal: Bones demineralized with bulging L5-S1 disc. IMPRESSION: Abnormal soft tissue density at the mid transverse colon suspicious for colon cancer, 3.0 x 4.5 x 5.1 cm, consider colonoscopy. Retroperitoneal adenopathy concerning for metastatic disease/tumor spread, including soft tissue mass in the RIGHT pelvis 3.9 cm greatest size which could represent tumor or metastatic disease to the RIGHT ovary. RIGHT hydronephrosis and RIGHT hydroureter secondary to distal RIGHT ureteral obstruction at the  level of the above soft tissue mass in the RIGHT pelvis. Extent of disease could be assessed by PET-CT imaging. Electronically Signed   By: Lavonia Dana M.D.   On: 12/27/2017 14:29   Nm Pet Image Initial (pi) Skull Base To Thigh  Result Date: 01/04/2018 CLINICAL DATA:  Initial treatment strategy for colon cancer. EXAM: NUCLEAR MEDICINE PET SKULL BASE TO THIGH TECHNIQUE: 7.4 mCi F-18 FDG was injected intravenously. Full-ring PET imaging was performed from the skull base to thigh after the radiotracer. CT data was obtained and used for attenuation correction and anatomic localization. Fasting blood glucose: 105 mg/dl COMPARISON:  CT abdomen 12/27/2017 FINDINGS: Mediastinal blood pool activity: SUV max 2.8 NECK: Left facial subcutaneous nodule measuring 1.7 by 1.3 cm sebaceous cyst or similar benign lesion. There is generalized accentuated metabolic activity in the thyroid gland although greater on the right side; the right thyroid lobe is also larger. Right thyroid activity 9.2 in a rather generalized fashion, left thyroid activity 4.3. The thyroid gland had a roughly similar size and contour back earliest available comparison of on 06/13/2012. Incidental CT findings: Mild chronic right maxillary sinusitis. CHEST: Right paraspinal 2.9 by 0.5 cm soft tissue density at the T11 level on the right noted with maximum SUV 5.4. This has a somewhat tubular configuration. Incidental CT findings: Mild biapical pleuroparenchymal scarring. Coronary, aortic arch, and branch vessel atherosclerotic vascular disease. Mild cardiomegaly. Trace right pleural effusion, new compared to 12/27/2017. ABDOMEN/PELVIS: The area of wall thickening in the transverse colon has notably accentuated metabolic activity with maximum SUV 24.8. Retrocaval lymph node 2.1 cm in short axis on image 149/3, maximum SUV 6.4. Aortocaval node measuring 2.0 cm in short axis on image 170/3 has maximum standard uptake value of 5.3. There is soft tissue density  in the distended right proximal and mid ureter with accentuated metabolic activity, maximum SUV approximately 9.5, favoring intraureteral tumor. There is right hydronephrosis and poor excretion of radiopharmaceutical into the right collecting system. A right common iliac node measuring about 1.4 cm in diameter on image 187/3 has a maximum SUV of 10.1. External iliac adenopathy there is accentuated activity within or along the right external iliac vein for example in the vicinity of image 202/3, with maximum SUV 7.1. I cannot exclude tumor thrombus in the iliac vein although this could represent immediately adjacent adenopathy. There is speckled calcifications along a mass in the vicinity of the right distal ureter/vaginal cuff measuring about 3.5 by 3.8 cm on image 210/3. This mass has a maximum SUV of 11.7. Faintly hypermetabolic nodules along the right paracolic gutter on image 443/1, measuring up to 7 mm in individual diameter with maximum SUV 3.2, favoring early peritoneal spread of malignancy. Incidental CT findings: Aortoiliac atherosclerotic vascular disease. Right hydronephrosis due to obstructive tumor. SKELETON: No significant abnormal hypermetabolic activity in this region. Incidental CT findings: none IMPRESSION: 1. Hypermetabolic mass of the transverse colon, with hypermetabolic and pathologic right eccentric retroperitoneal adenopathy, hypermetabolic tumor distending the right proximal and mid ureter, a hypermetabolic mass in the  vicinity of the distal ureter and vaginal cuff, and hypermetabolic suspected tumor thrombus in the right external iliac vein in the pelvis. There are also mildly hypermetabolic nodules along the right paracolic gutter compatible with early peritoneal spread of malignancy, and a small hypermetabolic right anterior paraspinal mass at the T11 level. Appearance compatible with active malignancy. 2. Trace right pleural effusion, new compared to 12/27/2017. 3. Generalized  accentuated metabolic activity in the thyroid gland, with the right thyroid lobe larger than the left and significantly more hypermetabolic. Given the similarity in size back through 2014 I suspect that this is probably from thyroiditis rather than thyroid malignancy, but thyroid ultrasound may be warranted. 4. 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 5. Other imaging findings of potential clinical significance: Chronic right maxillary sinusitis. Aortic Atherosclerosis (ICD10-I70.0). Coronary atherosclerosis with mild cardiomegaly. Electronically Signed   By: Van Clines M.D.   On: 01/04/2018 16:06    Assessment and Plan:   EUNIQUE BALIK is a 84 y.o. y/o female has been referred  to [facility] gastroenterology from the cancer center.  She is a patient of [facility] and was initially evaluated for a colonic mass that requires a colonoscopy to obtain tissue diagnosis.  It appears that [facility] do not have any openings in January 2020 and hence has been referred to see [place].  Plan 1.  Urgent colonoscopy to obtain tissue diagnosis. Will add to my schedule day after tomorrow   I have discussed alternative options, risks & benefits,  which include, but are not limited to, bleeding, infection, perforation,respiratory complication & drug reaction.  The patient agrees with this plan & written consent will be obtained.    Follow up PRN  Dr Jonathon Bellows MD,MRCP(U.K)

## 2018-01-17 ENCOUNTER — Telehealth: Payer: Self-pay | Admitting: Gastroenterology

## 2018-01-17 ENCOUNTER — Other Ambulatory Visit: Payer: Self-pay | Admitting: *Deleted

## 2018-01-17 NOTE — Telephone Encounter (Signed)
Pt left vm she is having a procedure tomorrow and is having discomfort she would like to know if she can take tylenol please call pt

## 2018-01-17 NOTE — Telephone Encounter (Signed)
Spoke with pt regarding her question about taking Tylenol. I explained to pt that she is okay to take Tylenol today but not the morning of her procedure.

## 2018-01-18 ENCOUNTER — Other Ambulatory Visit: Payer: Self-pay

## 2018-01-18 ENCOUNTER — Emergency Department
Admission: EM | Admit: 2018-01-18 | Discharge: 2018-01-18 | Disposition: A | Discharge status: Home / Self Care | Payer: Medicare Other | Source: Home / Self Care | Attending: Emergency Medicine | Admitting: Emergency Medicine

## 2018-01-18 ENCOUNTER — Ambulatory Visit: Payer: Medicare Other | Admitting: Anesthesiology

## 2018-01-18 ENCOUNTER — Emergency Department: Payer: Medicare Other

## 2018-01-18 ENCOUNTER — Telehealth: Payer: Self-pay | Admitting: Gastroenterology

## 2018-01-18 ENCOUNTER — Encounter: Payer: Self-pay | Admitting: Emergency Medicine

## 2018-01-18 ENCOUNTER — Encounter: Payer: Self-pay | Admitting: Anesthesiology

## 2018-01-18 ENCOUNTER — Ambulatory Visit
Admission: RE | Admit: 2018-01-18 | Discharge: 2018-01-18 | Disposition: A | Discharge status: Home / Self Care | Payer: Medicare Other | Source: Ambulatory Visit | Attending: Gastroenterology | Admitting: Gastroenterology

## 2018-01-18 ENCOUNTER — Encounter
Admission: RE | Disposition: A | Discharge status: Home / Self Care | Payer: Self-pay | Source: Ambulatory Visit | Attending: Gastroenterology

## 2018-01-18 DIAGNOSIS — K621 Rectal polyp: Secondary | ICD-10-CM | POA: Diagnosis not present

## 2018-01-18 DIAGNOSIS — Z85038 Personal history of other malignant neoplasm of large intestine: Secondary | ICD-10-CM

## 2018-01-18 DIAGNOSIS — J449 Chronic obstructive pulmonary disease, unspecified: Secondary | ICD-10-CM | POA: Diagnosis not present

## 2018-01-18 DIAGNOSIS — D122 Benign neoplasm of ascending colon: Secondary | ICD-10-CM | POA: Diagnosis not present

## 2018-01-18 DIAGNOSIS — D128 Benign neoplasm of rectum: Secondary | ICD-10-CM | POA: Insufficient documentation

## 2018-01-18 DIAGNOSIS — R1084 Generalized abdominal pain: Secondary | ICD-10-CM | POA: Insufficient documentation

## 2018-01-18 DIAGNOSIS — J45909 Unspecified asthma, uncomplicated: Secondary | ICD-10-CM

## 2018-01-18 DIAGNOSIS — Z7951 Long term (current) use of inhaled steroids: Secondary | ICD-10-CM | POA: Diagnosis not present

## 2018-01-18 DIAGNOSIS — Z79891 Long term (current) use of opiate analgesic: Secondary | ICD-10-CM | POA: Insufficient documentation

## 2018-01-18 DIAGNOSIS — D123 Benign neoplasm of transverse colon: Secondary | ICD-10-CM | POA: Insufficient documentation

## 2018-01-18 DIAGNOSIS — Z79899 Other long term (current) drug therapy: Secondary | ICD-10-CM | POA: Insufficient documentation

## 2018-01-18 DIAGNOSIS — Z7982 Long term (current) use of aspirin: Secondary | ICD-10-CM | POA: Diagnosis not present

## 2018-01-18 DIAGNOSIS — K219 Gastro-esophageal reflux disease without esophagitis: Secondary | ICD-10-CM | POA: Insufficient documentation

## 2018-01-18 DIAGNOSIS — K6389 Other specified diseases of intestine: Secondary | ICD-10-CM | POA: Diagnosis not present

## 2018-01-18 DIAGNOSIS — R109 Unspecified abdominal pain: Secondary | ICD-10-CM

## 2018-01-18 DIAGNOSIS — R933 Abnormal findings on diagnostic imaging of other parts of digestive tract: Secondary | ICD-10-CM | POA: Diagnosis not present

## 2018-01-18 HISTORY — PX: COLONOSCOPY WITH PROPOFOL: SHX5780

## 2018-01-18 LAB — URINALYSIS, COMPLETE (UACMP) WITH MICROSCOPIC
BILIRUBIN URINE: NEGATIVE
Bacteria, UA: NONE SEEN
Glucose, UA: NEGATIVE mg/dL
Hgb urine dipstick: NEGATIVE
KETONES UR: NEGATIVE mg/dL
Nitrite: NEGATIVE
PH: 5 (ref 5.0–8.0)
PROTEIN: NEGATIVE mg/dL
Specific Gravity, Urine: 1.04 — ABNORMAL HIGH (ref 1.005–1.030)

## 2018-01-18 LAB — COMPREHENSIVE METABOLIC PANEL
ALBUMIN: 3.7 g/dL (ref 3.5–5.0)
ALT: 23 U/L (ref 0–44)
AST: 30 U/L (ref 15–41)
Alkaline Phosphatase: 132 U/L — ABNORMAL HIGH (ref 38–126)
Anion gap: 8 (ref 5–15)
BUN: 23 mg/dL (ref 8–23)
CALCIUM: 9.1 mg/dL (ref 8.9–10.3)
CHLORIDE: 108 mmol/L (ref 98–111)
CO2: 24 mmol/L (ref 22–32)
CREATININE: 1.35 mg/dL — AB (ref 0.44–1.00)
GFR calc non Af Amer: 33 mL/min — ABNORMAL LOW (ref >60)
GFR, EST AFRICAN AMERICAN: 38 mL/min — AB (ref >60)
GLUCOSE: 142 mg/dL — AB (ref 70–99)
Potassium: 3.9 mmol/L (ref 3.5–5.1)
SODIUM: 140 mmol/L (ref 135–145)
Total Bilirubin: 0.4 mg/dL (ref 0.3–1.2)
Total Protein: 7 g/dL (ref 6.5–8.1)

## 2018-01-18 LAB — COMP PANEL: LEUKEMIA/LYMPHOMA

## 2018-01-18 LAB — CBC WITH DIFFERENTIAL/PLATELET
ABS IMMATURE GRANULOCYTES: 0.08 10*3/uL — AB (ref 0.00–0.07)
BASOS ABS: 0.1 10*3/uL (ref 0.0–0.1)
Basophils Relative: 1 %
Eosinophils Absolute: 0.5 10*3/uL (ref 0.0–0.5)
Eosinophils Relative: 4 %
HCT: 35.2 % — ABNORMAL LOW (ref 36.0–46.0)
HEMOGLOBIN: 11.3 g/dL — AB (ref 12.0–15.0)
IMMATURE GRANULOCYTES: 1 %
LYMPHS PCT: 6 %
Lymphs Abs: 0.9 10*3/uL (ref 0.7–4.0)
MCH: 29.7 pg (ref 26.0–34.0)
MCHC: 32.1 g/dL (ref 30.0–36.0)
MCV: 92.4 fL (ref 80.0–100.0)
MONO ABS: 0.9 10*3/uL (ref 0.1–1.0)
Monocytes Relative: 6 %
NEUTROS ABS: 11.5 10*3/uL — AB (ref 1.7–7.7)
NEUTROS PCT: 82 %
Platelets: 301 10*3/uL (ref 150–400)
RBC: 3.81 MIL/uL — AB (ref 3.87–5.11)
RDW: 12.6 % (ref 11.5–15.5)
WBC: 13.8 10*3/uL — AB (ref 4.0–10.5)
nRBC: 0 % (ref 0.0–0.2)

## 2018-01-18 LAB — LIPASE, BLOOD: Lipase: 35 U/L (ref 11–51)

## 2018-01-18 LAB — TROPONIN I

## 2018-01-18 SURGERY — COLONOSCOPY WITH PROPOFOL
Anesthesia: General

## 2018-01-18 MED ORDER — SODIUM CHLORIDE 0.9 % IV SOLN
INTRAVENOUS | Status: DC
  Administered 2018-01-18: 1000 mL via INTRAVENOUS

## 2018-01-18 MED ORDER — SPOT INK MARKER SYRINGE KIT
PACK | SUBMUCOSAL | Status: DC | PRN
  Administered 2018-01-18: 10 mL via SUBMUCOSAL

## 2018-01-18 MED ORDER — IOHEXOL 300 MG/ML  SOLN
75.0000 mL | Freq: Once | INTRAMUSCULAR | Status: AC | PRN
  Administered 2018-01-18: 75 mL via INTRAVENOUS

## 2018-01-18 MED ORDER — SODIUM CHLORIDE 0.9 % IV BOLUS
500.0000 mL | Freq: Once | INTRAVENOUS | Status: AC
  Administered 2018-01-18: 500 mL via INTRAVENOUS

## 2018-01-18 MED ORDER — PROPOFOL 500 MG/50ML IV EMUL
INTRAVENOUS | Status: DC | PRN
  Administered 2018-01-18: 75 ug/kg/min via INTRAVENOUS

## 2018-01-18 MED ORDER — PROPOFOL 10 MG/ML IV BOLUS
INTRAVENOUS | Status: DC | PRN
  Administered 2018-01-18: 40 mg via INTRAVENOUS

## 2018-01-18 MED ORDER — FENTANYL CITRATE (PF) 100 MCG/2ML IJ SOLN
25.0000 ug | Freq: Once | INTRAMUSCULAR | Status: AC
  Administered 2018-01-18: 25 ug via INTRAVENOUS
  Filled 2018-01-18: qty 2

## 2018-01-18 MED ORDER — OXYCODONE-ACETAMINOPHEN 5-325 MG PO TABS
1.0000 | ORAL_TABLET | Freq: Once | ORAL | Status: AC
  Administered 2018-01-18: 1 via ORAL
  Filled 2018-01-18: qty 1

## 2018-01-18 NOTE — Anesthesia Postprocedure Evaluation (Signed)
Anesthesia Post Note  Patient: Kristi Thompson  Procedure(s) Performed: COLONOSCOPY WITH PROPOFOL (N/A )  Patient location during evaluation: Endoscopy Anesthesia Type: General Level of consciousness: awake and alert Pain management: pain level controlled Vital Signs Assessment: post-procedure vital signs reviewed and stable Respiratory status: spontaneous breathing, nonlabored ventilation, respiratory function stable and patient connected to nasal cannula oxygen Cardiovascular status: blood pressure returned to baseline and stable Postop Assessment: no apparent nausea or vomiting Anesthetic complications: no     Last Vitals:  Vitals:   01/18/18 1007 01/18/18 1017  BP: 139/70 140/66  Pulse: 64 (!) 56  Resp: 11 20  Temp:    SpO2: 99% 97%    Last Pain:  Vitals:   01/18/18 1017  TempSrc:   PainSc: 0-No pain                 Rosmery Duggin S

## 2018-01-18 NOTE — ED Notes (Signed)
First Nurse Note: pt had colonoscopy today and is now having abd pain

## 2018-01-18 NOTE — H&P (Signed)
Jonathon Bellows, MD [address] [place], [place], [place], [address] [address], [place], [place], [place], [address] Phone: [phone]  Fax: [phone]  Primary Care Physician:  [name], MD   Pre-Procedure History & Physical: HPI:  Kristi Thompson is a 84 y.o. female is here for an colonoscopy.   Past Medical History:  Diagnosis Date  . Asthma   . Cancer ([place])    colon ca  . COPD (chronic obstructive pulmonary disease) ([place])   . GERD (gastroesophageal reflux disease)     Past Surgical History:  Procedure Laterality Date  . ABDOMINAL HYSTERECTOMY    . bowel obstruction    . CHOLECYSTECTOMY    . COLON SURGERY     colon cancer  . THYROIDECTOMY      Prior to Admission medications   Medication Sig Start Date End Date Taking? Authorizing Provider  aspirin EC 81 MG tablet Take by mouth.   Yes [provider]  budesonide (PULMICORT FLEXHALER) 180 MCG/ACT inhaler Inhale into the lungs. 05/23/17 05/23/18 Yes [provider]  Cyanocobalamin 2500 MCG SUBL Place under the tongue.   Yes [provider]  famotidine (PEPCID) 40 MG tablet Take by mouth. 12/19/17 12/19/18 Yes [provider]  fluticasone (FLONASE) 50 MCG/ACT nasal spray USE 2 SPRAYS INTO BOTH     NOSTRILS NIGHTLY 06/13/17  Yes [provider]  HYDROcodone-acetaminophen (NORCO/VICODIN) 5-325 MG tablet TK 1 T PO Q 6 H PRN 12/27/17  Yes [provider]  montelukast (SINGULAIR) 10 MG tablet TAKE 1 TABLET DAILY 07/16/16  Yes [provider]  Multiple Vitamin (MULTI-VITAMINS) TABS Take by mouth.   Yes [provider]  tamsulosin (FLOMAX) 0.4 MG CAPS capsule Take by mouth.   Yes [provider]  albuterol (PROVENTIL HFA;VENTOLIN HFA) 108 (90 Base) MCG/ACT inhaler Inhale into the lungs.    [provider]    Allergies as of 01/16/2018 - Review Complete 01/16/2018  Allergen Reaction Noted  . Demerol [meperidine] Swelling  02/22/2015  . Sulfa antibiotics Swelling 02/22/2015    History reviewed. No pertinent family history.  Social History   Socioeconomic History  . Marital status: Married    Spouse name: Not on file  . Number of children: Not on file  . Years of education: Not on file  . Highest education level: Not on file  Occupational History  . Not on file  Social Needs  . Financial resource strain: Not on file  . Food insecurity:    Worry: Not on file    Inability: Not on file  . Transportation needs:    Medical: Not on file    Non-medical: Not on file  Tobacco Use  . Smoking status: Never Smoker  . Smokeless tobacco: Never Used  Substance and Sexual Activity  . Alcohol use: Yes  . Drug use: No  . Sexual activity: Never  Lifestyle  . Physical activity:    Days per week: Not on file    Minutes per session: Not on file  . Stress: Not on file  Relationships  . Social connections:    Talks on phone: Not on file    Gets together: Not on file    Attends religious service: Not on file    Active member of club or organization: Not on file    Attends meetings of clubs or organizations: Not on file    Relationship status: Not on file  . Intimate partner violence:  Fear of current or ex partner: Not on file    Emotionally abused: Not on file    Physically abused: Not on file    Forced sexual activity: Not on file  Other Topics Concern  . Not on file  Social History Narrative  . Not on file    Review of Systems: See HPI, otherwise negative ROS  Physical Exam: BP (!) 146/77   Pulse 82   Temp (!) 96.9 F (36.1 C) (Tympanic)   Resp 18   LMP  (LMP Unknown)   SpO2 98%  General:   Alert,  pleasant and cooperative in NAD Head:  Normocephalic and atraumatic. Neck:  Supple; no masses or thyromegaly. Lungs:  Clear throughout to auscultation, normal respiratory effort.    Heart:  +S1, +S2, Regular rate and rhythm, No edema. Abdomen:  Soft, nontender and nondistended. Normal bowel  sounds, without guarding, and without rebound.   Neurologic:  Alert and  oriented x4;  grossly normal neurologically.  Impression/Plan: Kristi Thompson is here for an colonoscopy to be performed for abnormal CT scan  Risks, benefits, limitations, and alternatives regarding  colonoscopy have been reviewed with the patient.  Questions have been answered.  All parties agreeable.   Jonathon Bellows, MD  01/18/2018, 9:27 AM

## 2018-01-18 NOTE — Telephone Encounter (Signed)
Pt daughter left vm pt has procedure   Today and is now having abdominal pain please call pt

## 2018-01-18 NOTE — ED Provider Notes (Signed)
Signout from Dr. Burlene Arnt in this 84 year old female with a history of metastatic cancer was presented to the emergency department for pain after colonoscopy.  Plan is to reassess after Percocet.  Physical Exam  BP (!) 156/64 (BP Location: Right Arm)   Pulse 84   Temp 99.8 F (37.7 C) (Oral) Comment: pt in the middleof eating  Resp 16   Ht 5\' 1"  (1.549 m)   Wt 61.7 kg   LMP  (LMP Unknown)   SpO2 96%   BMI 25.70 kg/m   Physical Exam Patient sitting in wheelchair at this time.  Calm and without any outward signs of distress. ED Course/Procedures     Procedures  MDM  Patient at this time says that her pain is improved after Percocet.  Says that her pain is tolerable and she feels comfortable being discharged home.  Patient to be discharged at this time.       Orbie Pyo, MD 01/18/18 2141

## 2018-01-18 NOTE — Op Note (Signed)
[facility] Gastroenterology Patient Name: Kristi Thompson Procedure Date: 01/18/2018 9:03 AM MRN: [id number] Account #: [id number] Date of Birth: October 28, 1923 Admit Type: Outpatient Age: 84 Room: [facility] ENDO ROOM 4 Gender: Female Note Status: Finalized Procedure:            Colonoscopy Indications:          Abnormal CT of the GI tract Providers:            Jonathon Bellows MD, MD Referring MD:         Leonie Douglas. Doy Hutching, MD (Referring MD) Medicines:            Monitored Anesthesia Care Complications:        No immediate complications. Procedure:            Pre-Anesthesia Assessment:                       - Prior to the procedure, a History and Physical was                        performed, and patient medications, allergies and                        sensitivities were reviewed. The patient's tolerance of                        previous anesthesia was reviewed.                       - The risks and benefits of the procedure and the                        sedation options and risks were discussed with the                        patient. All questions were answered and informed                        consent was obtained.                       - ASA Grade Assessment: III - A patient with severe                        systemic disease.                       After obtaining informed consent, the colonoscope was                        passed under direct vision. Throughout the procedure,                        the patient's blood pressure, pulse, and oxygen                        saturations were monitored continuously. The                        Colonoscope was introduced through the anus and  advanced to the the cecum, identified by the                        appendiceal orifice, IC valve and transillumination.                        The colonoscopy was performed with ease. The patient                        tolerated the procedure well. The quality of the  bowel                        preparation was good. Findings:      The perianal and digital rectal examinations were normal.      Six sessile polyps were found in the rectum and ascending colon. The       polyps were 4 to 5 mm in size. These polyps were removed with a cold       snare. Resection and retrieval were complete.      A fungating and sessile non-obstructing large mass was found in the mid       transverse colon. The mass was circumferential. The mass measured two cm       in length. No bleeding was present. This was biopsied with a cold       forceps for histology. Area was tattooed with an injection of 5 mL of       Spot (carbon black). Tatoo placed at the proximal and distal aspects of       the mass Impression:           - Six 4 to 5 mm polyps in the rectum and in the                        ascending colon, removed with a cold snare. Resected                        and retrieved.                       - Rule out malignancy, tumor in the mid transverse                        colon. Biopsied. Tattooed. Recommendation:       - Discharge patient to home (with escort).                       - Resume previous diet.                       - Continue present medications.                       - Await pathology results. Procedure Code(s):    --- Professional ---                       [phone], Colonoscopy, flexible; with removal of tumor(s),                        polyp(s), or other lesion(s) by snare technique  45381, Colonoscopy, flexible; with directed submucosal                        injection(s), any substance                       22297, 51, Colonoscopy, flexible; with biopsy, single                        or multiple Diagnosis Code(s):    --- Professional ---                       K62.1, Rectal polyp                       D12.2, Benign neoplasm of ascending colon                       D49.0, Neoplasm of unspecified behavior of digestive                         system                       R93.3, Abnormal findings on diagnostic imaging of other                        parts of digestive tract CPT copyright 2018 American [employer] Association. All rights reserved. The codes documented in this report are preliminary and upon coder review may  be revised to meet current compliance requirements. Jonathon Bellows, MD Jonathon Bellows MD, MD 01/18/2018 9:54:20 AM This report has been signed electronically. Number of Addenda: 0 Note Initiated On: 01/18/2018 9:03 AM Scope Withdrawal Time: 0 hours 15 minutes 18 seconds  Total Procedure Duration: 0 hours 18 minutes 10 seconds       [facility]

## 2018-01-18 NOTE — Anesthesia Preprocedure Evaluation (Signed)
Anesthesia Evaluation  Patient identified by MRN, date of birth, ID band Patient awake    Reviewed: Allergy & Precautions, NPO status , Patient's Chart, lab work & pertinent test results, reviewed documented beta blocker date and time   Airway Mallampati: II  TM Distance: >3 FB     Dental  (+) Chipped   Pulmonary asthma , COPD,           Cardiovascular      Neuro/Psych    GI/Hepatic GERD  ,  Endo/Other    Renal/GU      Musculoskeletal   Abdominal   Peds  Hematology   Anesthesia Other Findings iRbbb.  Reproductive/Obstetrics                             Anesthesia Physical Anesthesia Plan  ASA: III  Anesthesia Plan: General   Post-op Pain Management:    Induction: Intravenous  PONV Risk Score and Plan:   Airway Management Planned:   Additional Equipment:   Intra-op Plan:   Post-operative Plan:   Informed Consent: I have reviewed the patients History and Physical, chart, labs and discussed the procedure including the risks, benefits and alternatives for the proposed anesthesia with the patient or authorized representative who has indicated his/her understanding and acceptance.     Plan Discussed with: CRNA  Anesthesia Plan Comments:         Anesthesia Quick Evaluation

## 2018-01-18 NOTE — Anesthesia Post-op Follow-up Note (Signed)
Anesthesia QCDR form completed.        

## 2018-01-18 NOTE — ED Notes (Signed)
Spoke with MD.Schaevitz about pt presentation, see orders

## 2018-01-18 NOTE — Transfer of Care (Signed)
Immediate Anesthesia Transfer of Care Note  Patient: Kristi Thompson  Procedure(s) Performed: COLONOSCOPY WITH PROPOFOL (N/A )  Patient Location: PACU  Anesthesia Type:General  Level of Consciousness: awake and sedated  Airway & Oxygen Therapy: Patient Spontanous Breathing and Patient connected to nasal cannula oxygen  Post-op Assessment: Report given to RN and Post -op Vital signs reviewed and stable  Post vital signs: Reviewed and stable  Last Vitals:  Vitals Value Taken Time  BP    Temp    Pulse    Resp    SpO2      Last Pain:  Vitals:   01/18/18 0848  TempSrc: Tympanic  PainSc: 0-No pain         Complications: No apparent anesthesia complications

## 2018-01-18 NOTE — Discharge Instructions (Addendum)
Obviously, there are many abnormalities on your CT scan which have been there for some time.  We are very sorry to see your cancer diagnosis and we wish you the best of luck with it.  At this time there does not appear to be any acute problems caused by your colonoscopy today, your blood work and your vital signs and your CT scan do not show anything significant that needs attention at this moment.  We do feel the most likely your cramping was because you were somewhat dehydrated after your procedure.  We do ask you to drink plenty of fluid when you get home, you do have hydrocodone at home, he may take a pill if you have pain but if you have severe pain vomiting or you feel worse in any significant way including fever would like you to return to the emergency department.  Please continue to follow closely with your primary care doctor and your cancer doctor and your GI doctor for further attention to the already existing problems that we know about in your colon and the other parts of your abdomen.  The ER is always open to you if you feel worse.

## 2018-01-18 NOTE — ED Provider Notes (Addendum)
[facility] Emergency Department Provider Note  ____________________________________________   I have reviewed the triage vital signs and the nursing notes. Where available I have reviewed prior notes and, if possible and indicated, outside hospital notes.    HISTORY  Chief Complaint Abdominal Pain    HPI Kristi Thompson is a 84 y.o. female diagnosed with colon cancer, with hydronephrosis and what appears to be intra-abdominal mets, had a colonoscopy today, after the colonoscopy she began to have a cramping discomfort in her abdomen which comes and goes.  Is diffuse.  She did have some lunch.  She has not yet had a bowel movement after the procedure.  She has had no fever or vomiting.  She just has crampy pain after her colonoscopy.  No other complaints.   Past Medical History:  Diagnosis Date  . Asthma   . Cancer ([place])    colon ca  . COPD (chronic obstructive pulmonary disease) ([place])   . GERD (gastroesophageal reflux disease)     There are no active problems to display for this patient.   Past Surgical History:  Procedure Laterality Date  . ABDOMINAL HYSTERECTOMY    . bowel obstruction    . CHOLECYSTECTOMY    . COLON SURGERY     colon cancer  . THYROIDECTOMY      Prior to Admission medications   Medication Sig Start Date End Date Taking? Authorizing Provider  albuterol (PROVENTIL HFA;VENTOLIN HFA) 108 (90 Base) MCG/ACT inhaler Inhale into the lungs.    [provider]  aspirin EC 81 MG tablet Take by mouth.    [provider]  budesonide (PULMICORT FLEXHALER) 180 MCG/ACT inhaler Inhale into the lungs. 05/23/17 05/23/18  [provider]  Cyanocobalamin 2500 MCG SUBL Place under the tongue.    [provider]  famotidine (PEPCID) 40 MG tablet Take by mouth. 12/19/17 12/19/18  [provider]  fluticasone (FLONASE) 50 MCG/ACT nasal spray USE 2 SPRAYS INTO BOTH     NOSTRILS NIGHTLY 06/13/17   [provider]  HYDROcodone-acetaminophen (NORCO/VICODIN) 5-325 MG tablet TK 1 T PO Q 6 H PRN 12/27/17   [provider]  montelukast (SINGULAIR) 10 MG tablet TAKE 1 TABLET DAILY 07/16/16   [provider]  Multiple Vitamin (MULTI-VITAMINS) TABS Take by mouth.    [provider]  tamsulosin (FLOMAX) 0.4 MG CAPS capsule Take by mouth.    [provider]    Allergies Demerol [meperidine] and Sulfa antibiotics  No family history on file.  Social History Social History   Tobacco Use  . Smoking status: Never Smoker  . Smokeless tobacco: Never Used  Substance Use Topics  . Alcohol use: Yes  . Drug use: No    Review of Systems Constitutional: No fever/chills Eyes: No visual changes. ENT: No sore throat. No stiff neck no neck pain Cardiovascular: Denies chest pain. Respiratory: Denies shortness of breath. Gastrointestinal:   no vomiting.  No diarrhea.  No constipation. Genitourinary: Negative for dysuria. Musculoskeletal: Negative lower extremity swelling Skin: Negative for rash. Neurological: Negative for severe headaches, focal weakness or numbness.   ____________________________________________   PHYSICAL EXAM:  VITAL SIGNS: ED Triage Vitals  Enc Vitals Group     BP 01/18/18 1711 126/79     Pulse Rate 01/18/18 1711 80     Resp 01/18/18 1711 18     Temp 01/18/18 1711 98.4 F (36.9 C)     Temp Source 01/18/18 1711 Oral  SpO2 01/18/18 1711 98 %     Weight 01/18/18 1714 136 lb (61.7 kg)     Height 01/18/18 1714 5\' 1"  (1.549 m)     Head Circumference --      Peak Flow --      Pain Score 01/18/18 1714 10     Pain Loc --      Pain Edu? --      Excl. in [place]? --     Constitutional: Alert and oriented. Well appearing and in no acute distress. Eyes: Conjunctivae are normal Head: Atraumatic HEENT: No congestion/rhinnorhea. Mucous membranes are moist.  Oropharynx non-erythematous Neck:   Nontender with no meningismus, no masses, no  stridor Cardiovascular: Normal rate, regular rhythm. Grossly normal heart sounds.  Good peripheral circulation. Respiratory: Normal respiratory effort.  No retractions. Lungs CTAB. Abdominal: Soft and mild diffuse tenderness. No distention. No guarding no rebound Back:  There is no focal tenderness or step off.  there is no midline tenderness there are no lesions noted. there is no CVA tenderness Musculoskeletal: No lower extremity tenderness, no upper extremity tenderness. No joint effusions, no DVT signs strong distal pulses no edema Neurologic:  Normal speech and language. No gross focal neurologic deficits are appreciated.  Skin:  Skin is warm, dry and intact. No rash noted. Psychiatric: Mood and affect are normal. Speech and behavior are normal.  ____________________________________________   LABS (all labs ordered are listed, but only abnormal results are displayed)  Labs Reviewed  CBC WITH DIFFERENTIAL/PLATELET - Abnormal; Notable for the following components:      Result Value   WBC 13.8 (*)    RBC 3.81 (*)    Hemoglobin 11.3 (*)    HCT 35.2 (*)    Neutro Abs 11.5 (*)    Abs Immature Granulocytes 0.08 (*)    All other components within normal limits  COMPREHENSIVE METABOLIC PANEL - Abnormal; Notable for the following components:   Glucose, Bld 142 (*)    Creatinine, Ser 1.35 (*)    Alkaline Phosphatase 132 (*)    GFR calc non Af Amer 33 (*)    GFR calc Af Amer 38 (*)    All other components within normal limits  URINALYSIS, COMPLETE (UACMP) WITH MICROSCOPIC - Abnormal; Notable for the following components:   Color, Urine YELLOW (*)    APPearance CLEAR (*)    Specific Gravity, Urine 1.040 (*)    Leukocytes, UA TRACE (*)    All other components within normal limits  LIPASE, BLOOD  TROPONIN I    Pertinent labs  results that were available during my care of the patient were reviewed by me and considered in my medical decision making (see chart for  details). ____________________________________________  EKG  I personally interpreted any EKGs ordered by me or triage  ____________________________________________  RADIOLOGY  Pertinent labs & imaging results that were available during my care of the patient were reviewed by me and considered in my medical decision making (see chart for details). If possible, patient and/or family made aware of any abnormal findings.  Ct Abdomen Pelvis W Contrast  Result Date: 01/18/2018 CLINICAL DATA:  Generalized abdominal pain status post colonoscopy. EXAM: CT ABDOMEN AND PELVIS WITH CONTRAST TECHNIQUE: Multidetector CT imaging of the abdomen and pelvis was performed using the standard protocol following bolus administration of intravenous contrast. CONTRAST:  77mL OMNIPAQUE IOHEXOL 300 MG/ML  SOLN COMPARISON:  CT scan of December 27, 2017. FINDINGS: Lower chest: No acute abnormality. Hepatobiliary: No focal liver abnormality is  seen. No gallstones, gallbladder wall thickening, or biliary dilatation. Pancreas: Unremarkable. No pancreatic ductal dilatation or surrounding inflammatory changes. Spleen: Normal in size without focal abnormality. Adrenals/Urinary Tract: 2.1 cm right adrenal mass is noted concerning for metastatic disease. Left adrenal gland appears normal. Small left renal cysts are noted. Severe right hydronephrosis is noted, with ureteral obstruction secondary to malignant neoplasm or metastatic disease in the right retroperitoneal region. Urinary bladder is unremarkable. Stomach/Bowel: The stomach appears normal. There is no evidence of bowel obstruction or inflammation. Irregular soft tissue mass is seen involving the transverse colon consistent with malignancy. It measures 6.9 x 3.9 cm. The appendix is not visualized. Vascular/Lymphatic: Atherosclerosis of abdominal aorta is noted. 2.3 cm right periaortic lymph node is noted. 12 mm right periaortic lymph node is noted more inferiorly. Reproductive:  Status post hysterectomy. No adnexal masses. Other: No hernia or fluid collection is noted. 4.3 x 6.4 cm irregularly enhancing mass is noted in the right retroperitoneal region along the course of the right ureter consistent with malignancy or metastatic disease. No pneumoperitoneum is noted. Musculoskeletal: No acute or significant osseous findings. IMPRESSION: 6.9 x 3.9 cm irregular mass seen in transverse colon consistent with colonic malignancy. Right adrenal mass is noted as well as right periaortic adenopathy consistent with metastatic disease. Severe right hydronephrosis is noted secondary to obstruction from large irregular mass seen in the right retroperitoneal region in the expected location of the right ureter. This is concerning for metastatic disease or malignancy. Aortic Atherosclerosis (ICD10-I70.0). Electronically Signed   By: Marijo Conception, M.D.   On: 01/18/2018 18:28   ____________________________________________    PROCEDURES  Procedure(s) performed: None  Procedures  Critical Care performed: None  ____________________________________________   INITIAL IMPRESSION / ASSESSMENT AND PLAN / ED COURSE  Pertinent labs & imaging results that were available during my care of the patient were reviewed by me and considered in my medical decision making (see chart for details).  She with crampy post procedure discomfort from a colonoscopy.  Adamant is nonsurgical, we did do blood work and a CT scan to rule out perforation out of the waiting room, blood work is reassuring the skin does not appear to show any acute change from prior certainly no perforation, we will give her some pain medication to see if we can use the cramping.  It is a pretty significant event to have a full bowel prep and a colonoscopy at age 19, we I hope can get her feeling better.  ----------------------------------------- 8:18 PM on 01/18/2018 -----------------------------------------  Her IV fluids and a  essentially minimal amount of pain medications, patient's does have completely resolved she has had no more cramping abdominal discomfort I suspect this is more secondary to dehydration than anything else.  Clearly she has an abnormal CT scan but looking back at prior notes this does not appear to be acute, and there does not appear to be any decompensation from her procedure today.  It is certainly a big insult to the body at age 48 to have a full colonic washout and a scope but there is no evidence of perforation, considering the patient's symptoms, medical history, and physical examination today, I have low suspicion for cholecystitis or biliary pathology, pancreatitis, perforation or bowel obstruction, hernia, intra-abdominal abscess, AAA or dissection, volvulus or intussusception, mesenteric ischemia, ischemic gut, pyelonephritis or appendicitis.  This is appear to be a referred intrathoracic pathology.  Patient feels 100% better if she can tolerate p.o. we will discharge her.  -----------------------------------------  8:54 PM on 01/18/2018 -----------------------------------------  States she has a slight cramping she is worried is coming back she would like to take a Percocet prior to discharge, we will give her one, we will continue to observe her signed out to Dr. Clearnce Hasten at the end of my shift    ____________________________________________   FINAL CLINICAL IMPRESSION(S) / ED DIAGNOSES  Final diagnoses:  None      This chart was dictated using voice recognition software.  Despite best efforts to proofread,  errors can occur which can change meaning.      Schuyler Amor, MD 01/18/18 1943    Schuyler Amor, MD 01/18/18 2019    Schuyler Amor, MD 01/18/18 [phone]

## 2018-01-18 NOTE — ED Triage Notes (Signed)
Pt had colonoscopy this am and states has had sudden onset of severe LLQ abd pain x1hr after procedure. PT appears uncomfortable. VSS

## 2018-01-18 NOTE — Anesthesia Procedure Notes (Signed)
Date/Time: 01/18/2018 9:37 AM Performed by: Nelda Marseille, CRNA Pre-anesthesia Checklist: Patient identified, Emergency Drugs available, Suction available, Patient being monitored and Timeout performed Oxygen Delivery Method: Nasal cannula

## 2018-01-21 ENCOUNTER — Other Ambulatory Visit: Payer: Self-pay | Admitting: Radiology

## 2018-01-21 ENCOUNTER — Encounter: Payer: Self-pay | Admitting: Gastroenterology

## 2018-01-21 LAB — SURGICAL PATHOLOGY

## 2018-01-21 NOTE — Telephone Encounter (Signed)
Pt left vm for Dejah

## 2018-01-21 NOTE — Telephone Encounter (Signed)
Called pt to follow up on her condition since the colonoscopy and ED visit 01-18-18.  LVM to return call

## 2018-01-23 ENCOUNTER — Ambulatory Visit
Admission: RE | Admit: 2018-01-23 | Discharge: 2018-01-23 | Disposition: A | Discharge status: Home / Self Care | Payer: Medicare Other | Source: Ambulatory Visit | Attending: Oncology | Admitting: Oncology

## 2018-01-23 ENCOUNTER — Other Ambulatory Visit: Payer: Self-pay

## 2018-01-23 DIAGNOSIS — I2510 Atherosclerotic heart disease of native coronary artery without angina pectoris: Secondary | ICD-10-CM | POA: Insufficient documentation

## 2018-01-23 DIAGNOSIS — J449 Chronic obstructive pulmonary disease, unspecified: Secondary | ICD-10-CM | POA: Diagnosis not present

## 2018-01-23 DIAGNOSIS — Z9049 Acquired absence of other specified parts of digestive tract: Secondary | ICD-10-CM | POA: Insufficient documentation

## 2018-01-23 DIAGNOSIS — Z882 Allergy status to sulfonamides status: Secondary | ICD-10-CM | POA: Diagnosis not present

## 2018-01-23 DIAGNOSIS — J320 Chronic maxillary sinusitis: Secondary | ICD-10-CM | POA: Diagnosis not present

## 2018-01-23 DIAGNOSIS — R590 Localized enlarged lymph nodes: Secondary | ICD-10-CM | POA: Insufficient documentation

## 2018-01-23 DIAGNOSIS — E279 Disorder of adrenal gland, unspecified: Secondary | ICD-10-CM | POA: Insufficient documentation

## 2018-01-23 DIAGNOSIS — Z90710 Acquired absence of both cervix and uterus: Secondary | ICD-10-CM | POA: Diagnosis not present

## 2018-01-23 DIAGNOSIS — Z85038 Personal history of other malignant neoplasm of large intestine: Secondary | ICD-10-CM | POA: Insufficient documentation

## 2018-01-23 DIAGNOSIS — R1900 Intra-abdominal and pelvic swelling, mass and lump, unspecified site: Secondary | ICD-10-CM

## 2018-01-23 DIAGNOSIS — Z7982 Long term (current) use of aspirin: Secondary | ICD-10-CM | POA: Insufficient documentation

## 2018-01-23 DIAGNOSIS — Z9889 Other specified postprocedural states: Secondary | ICD-10-CM | POA: Diagnosis not present

## 2018-01-23 DIAGNOSIS — Z79899 Other long term (current) drug therapy: Secondary | ICD-10-CM | POA: Diagnosis not present

## 2018-01-23 DIAGNOSIS — I700 Atherosclerosis of aorta: Secondary | ICD-10-CM | POA: Diagnosis not present

## 2018-01-23 DIAGNOSIS — Z885 Allergy status to narcotic agent status: Secondary | ICD-10-CM | POA: Insufficient documentation

## 2018-01-23 DIAGNOSIS — Z79891 Long term (current) use of opiate analgesic: Secondary | ICD-10-CM | POA: Insufficient documentation

## 2018-01-23 LAB — CBC
HEMATOCRIT: 33.3 % — AB (ref 36.0–46.0)
Hemoglobin: 10.7 g/dL — ABNORMAL LOW (ref 12.0–15.0)
MCH: 29 pg (ref 26.0–34.0)
MCHC: 32.1 g/dL (ref 30.0–36.0)
MCV: 90.2 fL (ref 80.0–100.0)
Platelets: 301 10*3/uL (ref 150–400)
RBC: 3.69 MIL/uL — AB (ref 3.87–5.11)
RDW: 12.6 % (ref 11.5–15.5)
WBC: 8.9 10*3/uL (ref 4.0–10.5)
nRBC: 0 % (ref 0.0–0.2)

## 2018-01-23 LAB — PROTIME-INR
INR: 1
Prothrombin Time: 13.1 seconds (ref 11.4–15.2)

## 2018-01-23 MED ORDER — MIDAZOLAM HCL 2 MG/2ML IJ SOLN
INTRAMUSCULAR | Status: AC | PRN
  Administered 2018-01-23: 1 mg via INTRAVENOUS

## 2018-01-23 MED ORDER — FENTANYL CITRATE (PF) 100 MCG/2ML IJ SOLN
INTRAMUSCULAR | Status: AC | PRN
  Administered 2018-01-23: 50 ug via INTRAVENOUS

## 2018-01-23 MED ORDER — MIDAZOLAM HCL 5 MG/5ML IJ SOLN
INTRAMUSCULAR | Status: AC
  Filled 2018-01-23: qty 5

## 2018-01-23 MED ORDER — FENTANYL CITRATE (PF) 100 MCG/2ML IJ SOLN
INTRAMUSCULAR | Status: AC
  Filled 2018-01-23: qty 2

## 2018-01-23 MED ORDER — SODIUM CHLORIDE 0.9 % IV SOLN
INTRAVENOUS | Status: DC
  Administered 2018-01-23: 1000 mL via INTRAVENOUS

## 2018-01-23 NOTE — H&P (Signed)
Chief Complaint: Patient was seen in consultation today for No chief complaint on file.  at the request of Finnegan,Timothy J  Referring Physician(s): Finnegan,Timothy J  Supervising Physician: Marybelle Killings  Patient Status: [facility] - Out-pt  History of Present Illness: Kristi Thompson is a 84 y.o. female who presented with RLQ pain. She was found to have a transverse colon mass, a right ureteral location mass and a right para-adrenal mass. She has a history of colon cancer. She is referred for biopsy of the peri-adrenal mass.  Past Medical History:  Diagnosis Date  . Asthma   . Cancer ([place])    colon ca  . COPD (chronic obstructive pulmonary disease) ([place])   . GERD (gastroesophageal reflux disease)     Past Surgical History:  Procedure Laterality Date  . ABDOMINAL HYSTERECTOMY    . bowel obstruction    . CHOLECYSTECTOMY    . COLON SURGERY     colon cancer  . COLONOSCOPY WITH PROPOFOL N/A 01/18/2018   Procedure: COLONOSCOPY WITH PROPOFOL;  Surgeon: Jonathon Bellows, MD;  Location: [facility] ENDOSCOPY;  Service: Gastroenterology;  Laterality: N/A;  . THYROIDECTOMY      Allergies: Demerol [meperidine] and Sulfa antibiotics  Medications: Prior to Admission medications   Medication Sig Start Date End Date Taking? Authorizing Provider  aspirin EC 81 MG tablet Take by mouth.   Yes [provider]  budesonide (PULMICORT FLEXHALER) 180 MCG/ACT inhaler Inhale into the lungs. 05/23/17 05/23/18 Yes [provider]  Cyanocobalamin 2500 MCG SUBL Place under the tongue.   Yes [provider]  famotidine (PEPCID) 40 MG tablet Take by mouth. 12/19/17 12/19/18 Yes [provider]  fluticasone (FLONASE) 50 MCG/ACT nasal spray USE 2 SPRAYS INTO BOTH     NOSTRILS NIGHTLY 06/13/17  Yes [provider]  HYDROcodone-acetaminophen (NORCO/VICODIN) 5-325 MG tablet TK 1 T PO Q 6 H PRN 12/27/17  Yes [provider]  montelukast (SINGULAIR) 10 MG tablet TAKE 1  TABLET DAILY 07/16/16  Yes [provider]  Multiple Vitamin (MULTI-VITAMINS) TABS Take by mouth.   Yes [provider]  tamsulosin (FLOMAX) 0.4 MG CAPS capsule Take by mouth.   Yes [provider]  albuterol (PROVENTIL HFA;VENTOLIN HFA) 108 (90 Base) MCG/ACT inhaler Inhale into the lungs.    [provider]     History reviewed. No pertinent family history.  Social History   Socioeconomic History  . Marital status: Married    Spouse name: Not on file  . Number of children: Not on file  . Years of education: Not on file  . Highest education level: Not on file  Occupational History  . Not on file  Social Needs  . Financial resource strain: Not on file  . Food insecurity:    Worry: Not on file    Inability: Not on file  . Transportation needs:    Medical: Not on file    Non-medical: Not on file  Tobacco Use  . Smoking status: Never Smoker  . Smokeless tobacco: Never Used  Substance and Sexual Activity  . Alcohol use: Yes  . Drug use: No  . Sexual activity: Never  Lifestyle  . Physical activity:    Days per week: Not on file    Minutes per session: Not on file  . Stress: Not on file  Relationships  . Social connections:    Talks on phone: Not on file    Gets together: Not on file    Attends religious service: Not  on file    Active member of club or organization: Not on file    Attends meetings of clubs or organizations: Not on file    Relationship status: Not on file  Other Topics Concern  . Not on file  Social History Narrative  . Not on file     Review of Systems: A 12 point ROS discussed and pertinent positives are indicated in the HPI above.  All other systems are negative.  Review of Systems  Vital Signs: LMP  (LMP Unknown)   Physical Exam  Constitutional: She is oriented to person, place, and time. She appears well-developed and well-nourished.  HENT:  Head: Normocephalic and atraumatic.  Cardiovascular: Normal rate  and regular rhythm.  Pulmonary/Chest: Effort normal and breath sounds normal.  Neurological: She is alert and oriented to person, place, and time.    Imaging: Ct Abdomen Pelvis Wo Contrast  Result Date: 12/27/2017 CLINICAL DATA:  Lower abdominal and BILATERAL flank pain for 3 weeks, history of hysterectomy, cholecystectomy, colon resection, appendectomy, colon cancer, COPD, GERD EXAM: CT ABDOMEN AND PELVIS WITHOUT CONTRAST TECHNIQUE: Multidetector CT imaging of the abdomen and pelvis was performed following the standard protocol without IV contrast. Sagittal and coronal MPR images reconstructed from axial data set. Patient drank dilute oral contrast for exam COMPARISON:  None FINDINGS: Lower chest: Minimal atelectasis at LEFT base. Lung bases appear hyperinflated. Hepatobiliary: Liver and gallbladder normal appearance Pancreas: Atrophic pancreas without mass Spleen: Normal appearance Adrenals/Urinary Tract: Adrenal glands and LEFT kidney normal appearance. RIGHT hydronephrosis and proximal to mid ureteral dilatation appendix surgically absent by history. RIGHT ureter is dilated into the RIGHT pelvis where it terminates at a soft tissue mass, see below. Unremarkable LEFT ureter and bladder. Stomach/Bowel: Stomach unremarkable. Area of soft tissue attenuation at the mid transverse colon suspicious for colonic neoplasm, 3.0 x 4.5 x 5.1 cm. Remaining bowel loops unremarkable Vascular/Lymphatic: Atherosclerotic calcifications aorta and iliac arteries, minimally in coronary arteries. Aorta normal caliber. 18 mm aortocaval lymph node image 40. Additional abnormal soft tissue density in the RIGHT retroperitoneum adjacent to and along the course of the RIGHT ureter suspicious for additional retroperitoneal adenopathy, up to 16 mm short axis image 47. Numerous pelvic phleboliths. Reproductive: Uterus surgically absent. Atrophic LEFT ovary. Soft tissue mass with central calcifications in the RIGHT pelvis could  represent an abnormal enlarged RIGHT ovary 3.9 x 3.6 x 2.9 cm versus metastatic disease to the RIGHT ovary versus non lymph node tumor. This is likely causing the observed distal RIGHT ureteral obstruction. Other: No free air or free fluid.  No definite hernia. Musculoskeletal: Bones demineralized with bulging L5-S1 disc. IMPRESSION: Abnormal soft tissue density at the mid transverse colon suspicious for colon cancer, 3.0 x 4.5 x 5.1 cm, consider colonoscopy. Retroperitoneal adenopathy concerning for metastatic disease/tumor spread, including soft tissue mass in the RIGHT pelvis 3.9 cm greatest size which could represent tumor or metastatic disease to the RIGHT ovary. RIGHT hydronephrosis and RIGHT hydroureter secondary to distal RIGHT ureteral obstruction at the level of the above soft tissue mass in the RIGHT pelvis. Extent of disease could be assessed by PET-CT imaging. Electronically Signed   By: Lavonia Dana M.D.   On: 12/27/2017 14:29   Ct Abdomen Pelvis W Contrast  Result Date: 01/18/2018 CLINICAL DATA:  Generalized abdominal pain status post colonoscopy. EXAM: CT ABDOMEN AND PELVIS WITH CONTRAST TECHNIQUE: Multidetector CT imaging of the abdomen and pelvis was performed using the standard protocol following bolus administration of intravenous contrast. CONTRAST:  42mL  OMNIPAQUE IOHEXOL 300 MG/ML  SOLN COMPARISON:  CT scan of December 27, 2017. FINDINGS: Lower chest: No acute abnormality. Hepatobiliary: No focal liver abnormality is seen. No gallstones, gallbladder wall thickening, or biliary dilatation. Pancreas: Unremarkable. No pancreatic ductal dilatation or surrounding inflammatory changes. Spleen: Normal in size without focal abnormality. Adrenals/Urinary Tract: 2.1 cm right adrenal mass is noted concerning for metastatic disease. Left adrenal gland appears normal. Small left renal cysts are noted. Severe right hydronephrosis is noted, with ureteral obstruction secondary to malignant neoplasm or  metastatic disease in the right retroperitoneal region. Urinary bladder is unremarkable. Stomach/Bowel: The stomach appears normal. There is no evidence of bowel obstruction or inflammation. Irregular soft tissue mass is seen involving the transverse colon consistent with malignancy. It measures 6.9 x 3.9 cm. The appendix is not visualized. Vascular/Lymphatic: Atherosclerosis of abdominal aorta is noted. 2.3 cm right periaortic lymph node is noted. 12 mm right periaortic lymph node is noted more inferiorly. Reproductive: Status post hysterectomy. No adnexal masses. Other: No hernia or fluid collection is noted. 4.3 x 6.4 cm irregularly enhancing mass is noted in the right retroperitoneal region along the course of the right ureter consistent with malignancy or metastatic disease. No pneumoperitoneum is noted. Musculoskeletal: No acute or significant osseous findings. IMPRESSION: 6.9 x 3.9 cm irregular mass seen in transverse colon consistent with colonic malignancy. Right adrenal mass is noted as well as right periaortic adenopathy consistent with metastatic disease. Severe right hydronephrosis is noted secondary to obstruction from large irregular mass seen in the right retroperitoneal region in the expected location of the right ureter. This is concerning for metastatic disease or malignancy. Aortic Atherosclerosis (ICD10-I70.0). Electronically Signed   By: Marijo Conception, M.D.   On: 01/18/2018 18:28   Nm Pet Image Initial (pi) Skull Base To Thigh  Result Date: 01/04/2018 CLINICAL DATA:  Initial treatment strategy for colon cancer. EXAM: NUCLEAR MEDICINE PET SKULL BASE TO THIGH TECHNIQUE: 7.4 mCi F-18 FDG was injected intravenously. Full-ring PET imaging was performed from the skull base to thigh after the radiotracer. CT data was obtained and used for attenuation correction and anatomic localization. Fasting blood glucose: 105 mg/dl COMPARISON:  CT abdomen 12/27/2017 FINDINGS: Mediastinal blood pool  activity: SUV max 2.8 NECK: Left facial subcutaneous nodule measuring 1.7 by 1.3 cm sebaceous cyst or similar benign lesion. There is generalized accentuated metabolic activity in the thyroid gland although greater on the right side; the right thyroid lobe is also larger. Right thyroid activity 9.2 in a rather generalized fashion, left thyroid activity 4.3. The thyroid gland had a roughly similar size and contour back earliest available comparison of on 06/13/2012. Incidental CT findings: Mild chronic right maxillary sinusitis. CHEST: Right paraspinal 2.9 by 0.5 cm soft tissue density at the T11 level on the right noted with maximum SUV 5.4. This has a somewhat tubular configuration. Incidental CT findings: Mild biapical pleuroparenchymal scarring. Coronary, aortic arch, and branch vessel atherosclerotic vascular disease. Mild cardiomegaly. Trace right pleural effusion, new compared to 12/27/2017. ABDOMEN/PELVIS: The area of wall thickening in the transverse colon has notably accentuated metabolic activity with maximum SUV 24.8. Retrocaval lymph node 2.1 cm in short axis on image 149/3, maximum SUV 6.4. Aortocaval node measuring 2.0 cm in short axis on image 170/3 has maximum standard uptake value of 5.3. There is soft tissue density in the distended right proximal and mid ureter with accentuated metabolic activity, maximum SUV approximately 9.5, favoring intraureteral tumor. There is right hydronephrosis and poor excretion of radiopharmaceutical into  the right collecting system. A right common iliac node measuring about 1.4 cm in diameter on image 187/3 has a maximum SUV of 10.1. External iliac adenopathy there is accentuated activity within or along the right external iliac vein for example in the vicinity of image 202/3, with maximum SUV 7.1. I cannot exclude tumor thrombus in the iliac vein although this could represent immediately adjacent adenopathy. There is speckled calcifications along a mass in the  vicinity of the right distal ureter/vaginal cuff measuring about 3.5 by 3.8 cm on image 210/3. This mass has a maximum SUV of 11.7. Faintly hypermetabolic nodules along the right paracolic gutter on image 284/1, measuring up to 7 mm in individual diameter with maximum SUV 3.2, favoring early peritoneal spread of malignancy. Incidental CT findings: Aortoiliac atherosclerotic vascular disease. Right hydronephrosis due to obstructive tumor. SKELETON: No significant abnormal hypermetabolic activity in this region. Incidental CT findings: none IMPRESSION: 1. Hypermetabolic mass of the transverse colon, with hypermetabolic and pathologic right eccentric retroperitoneal adenopathy, hypermetabolic tumor distending the right proximal and mid ureter, a hypermetabolic mass in the vicinity of the distal ureter and vaginal cuff, and hypermetabolic suspected tumor thrombus in the right external iliac vein in the pelvis. There are also mildly hypermetabolic nodules along the right paracolic gutter compatible with early peritoneal spread of malignancy, and a small hypermetabolic right anterior paraspinal mass at the T11 level. Appearance compatible with active malignancy. 2. Trace right pleural effusion, new compared to 12/27/2017. 3. Generalized accentuated metabolic activity in the thyroid gland, with the right thyroid lobe larger than the left and significantly more hypermetabolic. Given the similarity in size back through 2014 I suspect that this is probably from thyroiditis rather than thyroid malignancy, but thyroid ultrasound may be warranted. 4. 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 5. Other imaging findings of potential clinical significance: Chronic right maxillary sinusitis. Aortic Atherosclerosis (ICD10-I70.0). Coronary atherosclerosis with mild cardiomegaly. Electronically Signed   By: Van Clines M.D.   On: 01/04/2018 16:06    Labs:  CBC: Recent Labs    01/18/18 1717  01/23/18 1038  WBC 13.8* 8.9  HGB 11.3* 10.7*  HCT 35.2* 33.3*  PLT 301 301    COAGS: Recent Labs    01/23/18 1038  INR 1.00    BMP: Recent Labs    12/27/17 0917 01/18/18 1717  NA  --  140  K  --  3.9  CL  --  108  CO2  --  24  GLUCOSE  --  142*  BUN  --  23  CALCIUM  --  9.1  CREATININE 1.70* 1.35*  GFRNONAA  --  33*  GFRAA  --  38*    LIVER FUNCTION TESTS: Recent Labs    01/18/18 1717  BILITOT 0.4  AST 30  ALT 23  ALKPHOS 132*  PROT 7.0  ALBUMIN 3.7    TUMOR MARKERS: No results for input(s): AFPTM, CEA, CA199, CHROMGRNA in the last 8760 hours.  Assessment and Plan:  Right peri-adrenal mass. Biopsy to follow.  Thank you for this interesting consult.  I greatly enjoyed meeting Kristi Thompson and look forward to participating in their care.  A copy of this report was sent to the requesting provider on this date.  Electronically Signed: Art A Bobbiejo Ishikawa, MD 01/23/2018, 11:42 AM   I spent a total of  40 Minutes   in face to face in clinical consultation, greater than 50% of which was counseling/coordinating care for CT guided retroperitoneal  biopsy.

## 2018-01-23 NOTE — Procedures (Signed)
R peri-adrenal mass biopsy 18 g times three EBL 0 Comp 0

## 2018-01-26 DIAGNOSIS — C801 Malignant (primary) neoplasm, unspecified: Secondary | ICD-10-CM | POA: Insufficient documentation

## 2018-01-26 NOTE — Progress Notes (Addendum)
[place]  [phone] [phone]  ID: [name] OB: 1923/12/18  MR#: [id number]  PXT#:[id number]  Patient Care Team: [name], MD as PCP - General (Internal Medicine) Clent Jacks, RN as Registered Nurse  CHIEF COMPLAINT: High-grade carcinoma.  INTERVAL HISTORY: Patient returns to clinic today for further evaluation and discuss her biopsy results.  She is anxious, but otherwise feels well.  She does not complain of pain today.  She has no neurologic complaints.  She denies any fevers or night sweats.  She has no chest pain, shortness of breath, cough, or hemoptysis.  She denies any nausea, vomiting, constipation, or diarrhea.  She has noted no changes in her bowel movements.  She denies any melena or hematochezia.  She has no urinary complaints.  Patient offers no further specific complaints today.  REVIEW OF SYSTEMS:   Review of Systems  Constitutional: Negative.  Negative for fever, malaise/fatigue and weight loss.  Respiratory: Negative.  Negative for cough, hemoptysis and shortness of breath.   Cardiovascular: Negative.  Negative for chest pain and leg swelling.  Gastrointestinal: Negative for abdominal pain, blood in stool, constipation, diarrhea, melena, nausea and vomiting.  Genitourinary: Negative.  Negative for dysuria and hematuria.  Musculoskeletal: Negative.  Negative for back pain.  Skin: Negative.  Negative for rash.  Neurological: Negative.  Negative for dizziness, focal weakness, weakness and headaches.  Psychiatric/Behavioral: The patient is nervous/anxious.     As per HPI. Otherwise, a complete review of systems is negative.  PAST MEDICAL HISTORY: Past Medical History:  Diagnosis Date  . Asthma   . Cancer ([place])    colon ca  . COPD (chronic obstructive pulmonary disease) ([place])   . GERD (gastroesophageal reflux disease)     PAST SURGICAL HISTORY: Past Surgical History:  Procedure Laterality Date  .  ABDOMINAL HYSTERECTOMY    . bowel obstruction    . CHOLECYSTECTOMY    . COLON SURGERY     colon cancer  . COLONOSCOPY WITH PROPOFOL N/A 01/18/2018   Procedure: COLONOSCOPY WITH PROPOFOL;  Surgeon: Jonathon Bellows, MD;  Location: [facility] ENDOSCOPY;  Service: Gastroenterology;  Laterality: N/A;  . THYROIDECTOMY      FAMILY HISTORY: Negative and noncontributory. No family history on file.  ADVANCED DIRECTIVES (Y/N):  N  HEALTH MAINTENANCE: Social History   Tobacco Use  . Smoking status: Never Smoker  . Smokeless tobacco: Never Used  Substance Use Topics  . Alcohol use: Yes  . Drug use: No     Colonoscopy:  PAP:  Bone density:  Lipid panel:  Allergies  Allergen Reactions  . Demerol [Meperidine] Swelling  . Sulfa Antibiotics Swelling    Current Outpatient Medications  Medication Sig Dispense Refill  . acetaminophen (TYLENOL) 325 MG tablet Take 325 mg by mouth every 6 (six) hours as needed.    Marland Kitchen aspirin EC 81 MG tablet Take by mouth.    . budesonide (PULMICORT FLEXHALER) 180 MCG/ACT inhaler Inhale into the lungs.    . Cyanocobalamin 2500 MCG SUBL Place under the tongue.    . famotidine (PEPCID) 40 MG tablet Take by mouth.    . fluticasone (FLONASE) 50 MCG/ACT nasal spray USE 2 SPRAYS INTO BOTH     NOSTRILS NIGHTLY    . montelukast (SINGULAIR) 10 MG tablet TAKE 1 TABLET DAILY    . Multiple Vitamin (MULTI-VITAMINS) TABS Take by mouth.    . tamsulosin (FLOMAX) 0.4 MG CAPS capsule Take by mouth.    Marland Kitchen albuterol (PROVENTIL HFA;VENTOLIN  HFA) 108 (90 Base) MCG/ACT inhaler Inhale into the lungs.    Marland Kitchen HYDROcodone-acetaminophen (NORCO/VICODIN) 5-325 MG tablet TK 1 T PO Q 6 H PRN  0   No current facility-administered medications for this visit.     OBJECTIVE: Vitals:   01/28/18 1007  BP: (!) 153/83  Pulse: 98  Resp: 18  Temp: (!) 97.4 F (36.3 C)     Body mass index is 26.09 kg/m.    ECOG FS:0 - Asymptomatic  General: Well-developed, well-nourished, no acute distress. Eyes:  Pink conjunctiva, anicteric sclera. HEENT: Normocephalic, moist mucous membranes, clear oropharnyx. Lungs: Clear to auscultation bilaterally. Heart: Regular rate and rhythm. No rubs, murmurs, or gallops. Abdomen: Soft, nontender, nondistended. No organomegaly noted, normoactive bowel sounds. Musculoskeletal: No edema, cyanosis, or clubbing. Neuro: Alert, answering all questions appropriately. Cranial nerves grossly intact. Skin: No rashes or petechiae noted. Psych: Normal affect. Lymphatics: No cervical, calvicular, axillary or inguinal LAD.   LAB RESULTS:  Lab Results  Component Value Date   NA 140 01/18/2018   K 3.9 01/18/2018   CL 108 01/18/2018   CO2 24 01/18/2018   GLUCOSE 142 (H) 01/18/2018   BUN 23 01/18/2018   CREATININE 1.35 (H) 01/18/2018   CALCIUM 9.1 01/18/2018   PROT 7.0 01/18/2018   ALBUMIN 3.7 01/18/2018   AST 30 01/18/2018   ALT 23 01/18/2018   ALKPHOS 132 (H) 01/18/2018   BILITOT 0.4 01/18/2018   GFRNONAA 33 (L) 01/18/2018   GFRAA 38 (L) 01/18/2018    Lab Results  Component Value Date   WBC 8.9 01/23/2018   NEUTROABS 11.5 (H) 01/18/2018   HGB 10.7 (L) 01/23/2018   HCT 33.3 (L) 01/23/2018   MCV 90.2 01/23/2018   PLT 301 01/23/2018     STUDIES: Ct Abdomen Pelvis W Contrast  Result Date: 01/18/2018 CLINICAL DATA:  Generalized abdominal pain status post colonoscopy. EXAM: CT ABDOMEN AND PELVIS WITH CONTRAST TECHNIQUE: Multidetector CT imaging of the abdomen and pelvis was performed using the standard protocol following bolus administration of intravenous contrast. CONTRAST:  65mL OMNIPAQUE IOHEXOL 300 MG/ML  SOLN COMPARISON:  CT scan of December 27, 2017. FINDINGS: Lower chest: No acute abnormality. Hepatobiliary: No focal liver abnormality is seen. No gallstones, gallbladder wall thickening, or biliary dilatation. Pancreas: Unremarkable. No pancreatic ductal dilatation or surrounding inflammatory changes. Spleen: Normal in size without focal abnormality.  Adrenals/Urinary Tract: 2.1 cm right adrenal mass is noted concerning for metastatic disease. Left adrenal gland appears normal. Small left renal cysts are noted. Severe right hydronephrosis is noted, with ureteral obstruction secondary to malignant neoplasm or metastatic disease in the right retroperitoneal region. Urinary bladder is unremarkable. Stomach/Bowel: The stomach appears normal. There is no evidence of bowel obstruction or inflammation. Irregular soft tissue mass is seen involving the transverse colon consistent with malignancy. It measures 6.9 x 3.9 cm. The appendix is not visualized. Vascular/Lymphatic: Atherosclerosis of abdominal aorta is noted. 2.3 cm right periaortic lymph node is noted. 12 mm right periaortic lymph node is noted more inferiorly. Reproductive: Status post hysterectomy. No adnexal masses. Other: No hernia or fluid collection is noted. 4.3 x 6.4 cm irregularly enhancing mass is noted in the right retroperitoneal region along the course of the right ureter consistent with malignancy or metastatic disease. No pneumoperitoneum is noted. Musculoskeletal: No acute or significant osseous findings. IMPRESSION: 6.9 x 3.9 cm irregular mass seen in transverse colon consistent with colonic malignancy. Right adrenal mass is noted as well as right periaortic adenopathy consistent with metastatic disease. Severe  right hydronephrosis is noted secondary to obstruction from large irregular mass seen in the right retroperitoneal region in the expected location of the right ureter. This is concerning for metastatic disease or malignancy. Aortic Atherosclerosis (ICD10-I70.0). Electronically Signed   By: Marijo Conception, M.D.   On: 01/18/2018 18:28   Nm Pet Image Initial (pi) Skull Base To Thigh  Result Date: 01/04/2018 CLINICAL DATA:  Initial treatment strategy for colon cancer. EXAM: NUCLEAR MEDICINE PET SKULL BASE TO THIGH TECHNIQUE: 7.4 mCi F-18 FDG was injected intravenously. Full-ring PET  imaging was performed from the skull base to thigh after the radiotracer. CT data was obtained and used for attenuation correction and anatomic localization. Fasting blood glucose: 105 mg/dl COMPARISON:  CT abdomen 12/27/2017 FINDINGS: Mediastinal blood pool activity: SUV max 2.8 NECK: Left facial subcutaneous nodule measuring 1.7 by 1.3 cm sebaceous cyst or similar benign lesion. There is generalized accentuated metabolic activity in the thyroid gland although greater on the right side; the right thyroid lobe is also larger. Right thyroid activity 9.2 in a rather generalized fashion, left thyroid activity 4.3. The thyroid gland had a roughly similar size and contour back earliest available comparison of on 06/13/2012. Incidental CT findings: Mild chronic right maxillary sinusitis. CHEST: Right paraspinal 2.9 by 0.5 cm soft tissue density at the T11 level on the right noted with maximum SUV 5.4. This has a somewhat tubular configuration. Incidental CT findings: Mild biapical pleuroparenchymal scarring. Coronary, aortic arch, and branch vessel atherosclerotic vascular disease. Mild cardiomegaly. Trace right pleural effusion, new compared to 12/27/2017. ABDOMEN/PELVIS: The area of wall thickening in the transverse colon has notably accentuated metabolic activity with maximum SUV 24.8. Retrocaval lymph node 2.1 cm in short axis on image 149/3, maximum SUV 6.4. Aortocaval node measuring 2.0 cm in short axis on image 170/3 has maximum standard uptake value of 5.3. There is soft tissue density in the distended right proximal and mid ureter with accentuated metabolic activity, maximum SUV approximately 9.5, favoring intraureteral tumor. There is right hydronephrosis and poor excretion of radiopharmaceutical into the right collecting system. A right common iliac node measuring about 1.4 cm in diameter on image 187/3 has a maximum SUV of 10.1. External iliac adenopathy there is accentuated activity within or along the right  external iliac vein for example in the vicinity of image 202/3, with maximum SUV 7.1. I cannot exclude tumor thrombus in the iliac vein although this could represent immediately adjacent adenopathy. There is speckled calcifications along a mass in the vicinity of the right distal ureter/vaginal cuff measuring about 3.5 by 3.8 cm on image 210/3. This mass has a maximum SUV of 11.7. Faintly hypermetabolic nodules along the right paracolic gutter on image 009/3, measuring up to 7 mm in individual diameter with maximum SUV 3.2, favoring early peritoneal spread of malignancy. Incidental CT findings: Aortoiliac atherosclerotic vascular disease. Right hydronephrosis due to obstructive tumor. SKELETON: No significant abnormal hypermetabolic activity in this region. Incidental CT findings: none IMPRESSION: 1. Hypermetabolic mass of the transverse colon, with hypermetabolic and pathologic right eccentric retroperitoneal adenopathy, hypermetabolic tumor distending the right proximal and mid ureter, a hypermetabolic mass in the vicinity of the distal ureter and vaginal cuff, and hypermetabolic suspected tumor thrombus in the right external iliac vein in the pelvis. There are also mildly hypermetabolic nodules along the right paracolic gutter compatible with early peritoneal spread of malignancy, and a small hypermetabolic right anterior paraspinal mass at the T11 level. Appearance compatible with active malignancy. 2. Trace right pleural effusion,  new compared to 12/27/2017. 3. Generalized accentuated metabolic activity in the thyroid gland, with the right thyroid lobe larger than the left and significantly more hypermetabolic. Given the similarity in size back through 2014 I suspect that this is probably from thyroiditis rather than thyroid malignancy, but thyroid ultrasound may be warranted. 4. 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 5. Other imaging findings of potential clinical significance:  Chronic right maxillary sinusitis. Aortic Atherosclerosis (ICD10-I70.0). Coronary atherosclerosis with mild cardiomegaly. Electronically Signed   By: Van Clines M.D.   On: 01/04/2018 16:06   Ct Biopsy  Result Date: 01/23/2018 INDICATION: Right Peri adrenal adenopathy EXAM: CT BIOPSY MEDICATIONS: None. ANESTHESIA/SEDATION: Fentanyl 50 mcg IV; Versed 1 mg IV Moderate Sedation Time:  8 minutes The patient was continuously monitored during the procedure by the interventional radiology nurse under my direct supervision. FLUOROSCOPY TIME:  Fluoroscopy Time:  minutes  seconds ( mGy). COMPLICATIONS: None immediate. PROCEDURE: Informed written consent was obtained from the patient after a thorough discussion of the procedural risks, benefits and alternatives. All questions were addressed. Maximal Sterile Barrier Technique was utilized including caps, mask, sterile gowns, sterile gloves, sterile drape, hand hygiene and skin antiseptic. A timeout was performed prior to the initiation of the procedure. Under CT guidance, a(n) 17 gauge guide needle was advanced into the right Peri adrenal lymph node. Subsequently, 3 18 gauge core biopsies were obtained. The guide needle was removed. Post biopsy images demonstrate no hemorrhage. Patient tolerated the procedure well without complication. Vital sign monitoring by nursing staff during the procedure will continue as patient is in the special procedures unit for post procedure observation. FINDINGS: The images document guide needle placement within the right Peri adrenal lymph node. Post biopsy images demonstrate no hemorrhage. IMPRESSION: CT-guided core biopsy of a right Peri adrenal lymph node. Electronically Signed   By: Marybelle Killings M.D.   On: 01/23/2018 12:52    ASSESSMENT: High-grade carcinoma.  PLAN:    1.  High-grade carcinoma: CT-guided biopsy confirmed malignancy, but additional testing is pending to determine a tissue of origin.  Colonoscopy completed  last week did not reveal any distinct malignancy making colon cancer less likely.  Pathology did not feel this was a lymphoma.  Tumor markers are all within normal limits other than a mildly elevated CEA at 8.4.  Given the extent of disease, surgery and XRT likely not possible, patient wishes to pursue chemotherapy once a final diagnosis is obtained.  A referral has been sent for port placement.  Patient will also be set for chemo class as well as to initiate treatment.  Return to clinic on February 13, 2018 to initiate cycle 1 of chemotherapy.    I spent a total of 30 minutes face-to-face with the patient of which greater than 50% of the visit was spent in counseling and coordination of care as detailed above.  Patient expressed understanding and was in agreement with this plan. She also understands that She can call clinic at any time with any questions, concerns, or complaints.   Cancer Staging No matching staging information was found for the patient.  Lloyd Huger, MD   01/28/2018 12:58 PM   Addendum: Patient underwent cystoscopy that noted a large urothelial mass biopsy consistent with urothelial carcinoma.  This is likely stage IVa.  Given patient's advanced age and decreased kidney function, hesitant to use cisplatin and gemcitibine, therefore will proceed with Tecentriq 1200 mg every 3 weeks.  Return to clinic on February 13, 2018  to initiate cycle 1.   Lloyd Huger, MD 02/12/18 12:24 PM

## 2018-01-28 ENCOUNTER — Other Ambulatory Visit: Payer: Self-pay

## 2018-01-28 ENCOUNTER — Inpatient Hospital Stay: Payer: Medicare Other | Attending: Oncology | Admitting: Oncology

## 2018-01-28 DIAGNOSIS — K219 Gastro-esophageal reflux disease without esophagitis: Secondary | ICD-10-CM | POA: Diagnosis not present

## 2018-01-28 DIAGNOSIS — N2889 Other specified disorders of kidney and ureter: Secondary | ICD-10-CM

## 2018-01-28 DIAGNOSIS — I2510 Atherosclerotic heart disease of native coronary artery without angina pectoris: Secondary | ICD-10-CM | POA: Diagnosis not present

## 2018-01-28 DIAGNOSIS — Z7982 Long term (current) use of aspirin: Secondary | ICD-10-CM

## 2018-01-28 DIAGNOSIS — R590 Localized enlarged lymph nodes: Secondary | ICD-10-CM

## 2018-01-28 DIAGNOSIS — J449 Chronic obstructive pulmonary disease, unspecified: Secondary | ICD-10-CM

## 2018-01-28 DIAGNOSIS — F419 Anxiety disorder, unspecified: Secondary | ICD-10-CM

## 2018-01-28 DIAGNOSIS — N131 Hydronephrosis with ureteral stricture, not elsewhere classified: Secondary | ICD-10-CM | POA: Diagnosis not present

## 2018-01-28 DIAGNOSIS — C189 Malignant neoplasm of colon, unspecified: Secondary | ICD-10-CM

## 2018-01-28 DIAGNOSIS — J320 Chronic maxillary sinusitis: Secondary | ICD-10-CM | POA: Diagnosis not present

## 2018-01-28 DIAGNOSIS — L723 Sebaceous cyst: Secondary | ICD-10-CM

## 2018-01-28 DIAGNOSIS — Z79899 Other long term (current) drug therapy: Secondary | ICD-10-CM | POA: Diagnosis not present

## 2018-01-28 DIAGNOSIS — R1900 Intra-abdominal and pelvic swelling, mass and lump, unspecified site: Secondary | ICD-10-CM

## 2018-01-28 DIAGNOSIS — E279 Disorder of adrenal gland, unspecified: Secondary | ICD-10-CM | POA: Diagnosis not present

## 2018-01-28 DIAGNOSIS — R419 Unspecified symptoms and signs involving cognitive functions and awareness: Secondary | ICD-10-CM | POA: Insufficient documentation

## 2018-01-28 NOTE — Progress Notes (Signed)
Here for follow up   Having loose stools watery stools -1 per day. abd R sided burning pain -relieves her pain . Here w family for support.

## 2018-01-28 NOTE — Telephone Encounter (Signed)
Spoke with pt daughter, Kristi Thompson, regarding her visit to the ED for abdominal pain after her colonoscopy procedure. Pt daughter states that the discomfort from the procedure has completely resolved.

## 2018-01-29 ENCOUNTER — Encounter (INDEPENDENT_AMBULATORY_CARE_PROVIDER_SITE_OTHER): Payer: Self-pay

## 2018-01-29 LAB — SURGICAL PATHOLOGY

## 2018-01-30 ENCOUNTER — Encounter: Payer: Self-pay | Admitting: Oncology

## 2018-01-31 ENCOUNTER — Other Ambulatory Visit: Payer: Self-pay

## 2018-01-31 ENCOUNTER — Telehealth: Payer: Self-pay

## 2018-01-31 ENCOUNTER — Other Ambulatory Visit: Payer: Medicare Other

## 2018-01-31 DIAGNOSIS — C679 Malignant neoplasm of bladder, unspecified: Secondary | ICD-10-CM

## 2018-01-31 DIAGNOSIS — N1330 Unspecified hydronephrosis: Principal | ICD-10-CM

## 2018-01-31 DIAGNOSIS — C689 Malignant neoplasm of urinary organ, unspecified: Secondary | ICD-10-CM

## 2018-01-31 NOTE — Telephone Encounter (Signed)
Can you see if you can work her into my clinic in [place] tomorrow?  Hollice Espy, MD

## 2018-01-31 NOTE — Progress Notes (Signed)
Tumor Board Documentation  Kristi Thompson was presented by Dr Grayland Ormond at our Tumor Board on 01/31/2018, which included representatives from medical oncology, radiation oncology, surgical oncology, surgical, radiology, pathology, navigation, pharmacy, genetics, research, palliative care, pulmonology.  Kristi Thompson currently presents as a new patient with history of the following treatments: none.  Additionally, we reviewed previous medical and familial history, history of present illness, and recent lab results along with all available histopathologic and imaging studies. The tumor board considered available treatment options and made the following recommendations: Neoadjuvant chemotherapy Refer to Urology, Check Omniscent and PDL 1  The following procedures/referrals were also placed: No orders of the defined types were placed in this encounter.   Clinical Trial Status: not discussed   Staging used:    National site-specific guidelines   were discussed with respect to the case.  Tumor board is a meeting of clinicians from various specialty areas who evaluate and discuss patients for whom a multidisciplinary approach is being considered. Final determinations in the plan of care are those of the provider(s). The responsibility for follow up of recommendations given during tumor board is that of the provider.   Today's extended care, comprehensive team conference, Kristi Thompson was not present for the discussion and was not examined.   Multidisciplinary Tumor Board is a multidisciplinary case peer review process.  Decisions discussed in the Multidisciplinary Tumor Board reflect the opinions of the specialists present at the conference without having examined the patient.  Ultimately, treatment and diagnostic decisions rest with the primary provider(s) and the patient.

## 2018-01-31 NOTE — Telephone Encounter (Signed)
Called and spoke with [name] and her daughter, [name]. Went over pathology results with her. Case was discussed in tumor board today and Dr. Erlene Quan can see her in the office for cystoscopy to evaluate for bladder mass, obtain further tissue and place a stent for hydronephrosis. I went over this procedure briefly with her and she is open to see Dr. Erlene Quan. Referral sent. We will leave her scheduled chemo class and follow up with Dr. Grayland Ormond to start treatment as is for now, in hopes that she can see Dr. Erlene Quan before these appointments.  Surgical Pathology  CASE: [id number]  PATIENT: Kristi Thompson  Surgical Pathology Report    SPECIMEN SUBMITTED:  A. Lymph node   CLINICAL HISTORY:  None provided   PRE-OPERATIVE DIAGNOSIS:  None provided   POST-OPERATIVE DIAGNOSIS:  None provided.    DIAGNOSIS:  A. LYMPH NODE, RIGHT PERI ADRENAL; NEEDLE BIOPSY:  - POORLY DIFFERENTIATED CARCINOMA.  - FAVOR UROTHELIAL ORIGIN.  Oncology Nurse Navigator Documentation  Navigator Location: [facility] (01/31/18 1400)   )Navigator Encounter Type: Telephone (01/31/18 1400) Telephone: [facility] Crocker Call;Diagnostic Results;Patient Update;Education (01/31/18 1400)                                                  Time Spent with Patient: 30 (01/31/18 1400)

## 2018-02-01 ENCOUNTER — Other Ambulatory Visit
Admission: RE | Admit: 2018-02-01 | Discharge: 2018-02-01 | Disposition: A | Discharge status: Home / Self Care | Payer: Medicare Other | Source: Ambulatory Visit | Attending: Urology | Admitting: Urology

## 2018-02-01 ENCOUNTER — Encounter: Payer: Self-pay | Admitting: Urology

## 2018-02-01 ENCOUNTER — Other Ambulatory Visit: Payer: Self-pay

## 2018-02-01 ENCOUNTER — Ambulatory Visit (INDEPENDENT_AMBULATORY_CARE_PROVIDER_SITE_OTHER): Payer: Medicare Other | Admitting: Urology

## 2018-02-01 VITALS — BP 124/65 | HR 90 | Wt 140.0 lb

## 2018-02-01 DIAGNOSIS — J45909 Unspecified asthma, uncomplicated: Secondary | ICD-10-CM | POA: Insufficient documentation

## 2018-02-01 DIAGNOSIS — Z8619 Personal history of other infectious and parasitic diseases: Secondary | ICD-10-CM | POA: Insufficient documentation

## 2018-02-01 DIAGNOSIS — N1330 Unspecified hydronephrosis: Secondary | ICD-10-CM

## 2018-02-01 DIAGNOSIS — K56609 Unspecified intestinal obstruction, unspecified as to partial versus complete obstruction: Secondary | ICD-10-CM | POA: Insufficient documentation

## 2018-02-01 DIAGNOSIS — C689 Malignant neoplasm of urinary organ, unspecified: Secondary | ICD-10-CM

## 2018-02-01 DIAGNOSIS — D510 Vitamin B12 deficiency anemia due to intrinsic factor deficiency: Secondary | ICD-10-CM | POA: Insufficient documentation

## 2018-02-01 DIAGNOSIS — N179 Acute kidney failure, unspecified: Secondary | ICD-10-CM | POA: Diagnosis not present

## 2018-02-01 DIAGNOSIS — R109 Unspecified abdominal pain: Secondary | ICD-10-CM | POA: Diagnosis present

## 2018-02-01 DIAGNOSIS — N200 Calculus of kidney: Secondary | ICD-10-CM | POA: Insufficient documentation

## 2018-02-01 DIAGNOSIS — E041 Nontoxic single thyroid nodule: Secondary | ICD-10-CM | POA: Insufficient documentation

## 2018-02-01 DIAGNOSIS — J449 Chronic obstructive pulmonary disease, unspecified: Secondary | ICD-10-CM | POA: Insufficient documentation

## 2018-02-01 DIAGNOSIS — E785 Hyperlipidemia, unspecified: Secondary | ICD-10-CM | POA: Insufficient documentation

## 2018-02-01 LAB — URINALYSIS, COMPLETE (UACMP) WITH MICROSCOPIC
Bilirubin Urine: NEGATIVE
Glucose, UA: NEGATIVE mg/dL
HGB URINE DIPSTICK: NEGATIVE
Ketones, ur: NEGATIVE mg/dL
Nitrite: NEGATIVE
SPECIFIC GRAVITY, URINE: 1.025 (ref 1.005–1.030)
pH: 5 (ref 5.0–8.0)

## 2018-02-01 NOTE — Progress Notes (Signed)
02/01/2018 1:09 PM   Kristi Thompson 1923/08/19 [id number]  Referring provider: [name], MD [facility] [place], [place] [address]  Chief Complaint  Patient presents with  . Hydronephrosis    HPI: 84 year old female with metastatic carcinoma of unknown primary presenting today for for consideration of cystoscopy, right ureteral stent, further diagnostic evaluation.  She was initially seen and evaluated for right lower quadrant pain.  She underwent a CT scan demonstrating a possible colonic mass, right hydronephrosis, and retroperitoneal lymphadenopathy including a soft tissue mass in the right pelvis measuring 3.9 cm.  There is concern for right distal ureteral obstruction above the level of the right soft tissue mass possibly even involving the ureter.  He was subsequently followed up with a PET scan on 01/04/2018 showing hypermetabolic transverse colon and hypermetabolic lymph nodes, diffuse metastatic disease.  She underwent CT-guided biopsy of a right periadrenal lymph node which is consistent with carcinoma of unknown primary.  Reviewed yesterday tumor board and this poorly differentiated carcinoma was most consistent with favoring urothelial origin but nondiagnostic.  The patient denies gross hematuria.  She has some persistent right lower quadrant pain but no overt right flank pain.  Cr 1.35 which is down from October at a peak of 1.7 but baseline appears to be 0.9 dating back to 2016.  She is a non-smoker.  She was exposed to secondhand smoke.  For age, she is quite good functional status.    She is now followed by Dr. Grayland Ormond, medical oncology, and scheduled to begin chemo in 2 weeks.   PMH: Past Medical History:  Diagnosis Date  . Asthma   . Cancer ([place])    colon ca  . COPD (chronic obstructive pulmonary disease) ([place])   . GERD (gastroesophageal reflux disease)     Surgical History: Past Surgical History:  Procedure  Laterality Date  . ABDOMINAL HYSTERECTOMY    . bowel obstruction    . CHOLECYSTECTOMY    . COLON SURGERY     colon cancer  . COLONOSCOPY WITH PROPOFOL N/A 01/18/2018   Procedure: COLONOSCOPY WITH PROPOFOL;  Surgeon: Jonathon Bellows, MD;  Location: [facility] ENDOSCOPY;  Service: Gastroenterology;  Laterality: N/A;  . THYROIDECTOMY      Home Medications:  Allergies as of 02/01/2018      Reactions   Demerol [meperidine] Swelling   Sulfa Antibiotics Swelling      Medication List        Accurate as of 02/01/18  1:09 PM. Always use your most recent med list.          acetaminophen 325 MG tablet Commonly known as:  TYLENOL Take 325 mg by mouth every 6 (six) hours as needed.   albuterol 108 (90 Base) MCG/ACT inhaler Commonly known as:  PROVENTIL HFA;VENTOLIN HFA Inhale into the lungs.   aspirin EC 81 MG tablet Take by mouth.   Cyanocobalamin 2500 MCG Subl Place under the tongue.   famotidine 40 MG tablet Commonly known as:  PEPCID Take by mouth.   fluticasone 50 MCG/ACT nasal spray Commonly known as:  FLONASE USE 2 SPRAYS INTO BOTH     NOSTRILS NIGHTLY   HYDROcodone-acetaminophen 5-325 MG tablet Commonly known as:  NORCO/VICODIN TK 1 T PO Q 6 H PRN   montelukast 10 MG tablet Commonly known as:  SINGULAIR TAKE 1 TABLET DAILY   MULTI-VITAMINS Tabs Take by mouth.   PULMICORT FLEXHALER 180 MCG/ACT inhaler Generic drug:  budesonide Inhale into the lungs.  tamsulosin 0.4 MG Caps capsule Commonly known as:  FLOMAX Take by mouth.       Allergies:  Allergies  Allergen Reactions  . Demerol [Meperidine] Swelling  . Sulfa Antibiotics Swelling    Family History: No family history on file.  Social History:  reports that she has never smoked. She has never used smokeless tobacco. She reports that she drinks alcohol. She reports that she does not use drugs.  ROS: UROLOGY Frequent Urination?: No Hard to postpone urination?: No Burning/pain with urination?: No Get  up at night to urinate?: No Leakage of urine?: No Urine stream starts and stops?: No Trouble starting stream?: No Do you have to strain to urinate?: No Blood in urine?: No Urinary tract infection?: No Sexually transmitted disease?: No Injury to kidneys or bladder?: No Painful intercourse?: No Weak stream?: No Currently pregnant?: No Vaginal bleeding?: No Last menstrual period?: n  Gastrointestinal Nausea?: No Vomiting?: No Indigestion/heartburn?: No Diarrhea?: No Constipation?: No  Constitutional Fever: No Night sweats?: No Weight loss?: No Fatigue?: No  Skin Skin rash/lesions?: No  Eyes Blurred vision?: No Double vision?: No  Ears/Nose/Throat Sore throat?: No Sinus problems?: No  Hematologic/Lymphatic Swollen glands?: No Easy bruising?: No  Cardiovascular Leg swelling?: No Chest pain?: No  Respiratory Cough?: No Shortness of breath?: No  Endocrine Excessive thirst?: No  Musculoskeletal Back pain?: No Joint pain?: No  Neurological Headaches?: No Dizziness?: No  Psychologic Depression?: No Anxiety?: No  Physical Exam: BP 124/65   Pulse 90   Wt 140 lb (63.5 kg)   LMP  (LMP Unknown)   BMI 26.45 kg/m   Constitutional:  Alert and oriented, No acute distress.  Accompanied by daughter today. HEENT: [place] AT, moist mucus membranes.  Trachea midline, no masses. Cardiovascular: No clubbing, cyanosis, or edema. Respiratory: Normal respiratory effort, no increased work of breathing. GI: Abdomen is soft, nontender, nondistended, tenderness with voluntary guarding in the right lower quadrant GU: No CVA tenderness Skin: No rashes, bruises or suspicious lesions. Neurologic: Grossly intact, no focal deficits, moving all 4 extremities. Psychiatric: Normal mood and affect.  Laboratory Data: Lab Results  Component Value Date   WBC 8.9 01/23/2018   HGB 10.7 (L) 01/23/2018   HCT 33.3 (L) 01/23/2018   MCV 90.2 01/23/2018   PLT 301 01/23/2018    Lab  Results  Component Value Date   CREATININE 1.35 (H) 01/18/2018    Urinalysis    Component Value Date/Time   COLORURINE YELLOW (A) 01/18/2018 1854   APPEARANCEUR CLEAR (A) 01/18/2018 1854   LABSPEC 1.040 (H) 01/18/2018 1854   PHURINE 5.0 01/18/2018 1854   GLUCOSEU NEGATIVE 01/18/2018 1854   HGBUR NEGATIVE 01/18/2018 [place] 01/18/2018 [place] 01/18/2018 [place] 01/18/2018 1854   NITRITE NEGATIVE 01/18/2018 1854   LEUKOCYTESUR TRACE (A) 01/18/2018 1854    Lab Results  Component Value Date   BACTERIA NONE SEEN 01/18/2018    Pertinent Imaging: I reviewed CT abdomen pelvis with contrast from 01/18/2018 as well as PET scan from 01/04/2018 and with the radiologist.  Assessment & Plan:    1. Hydronephrosis, unspecified hydronephrosis type Secondary to malignancy, either within the bladder, within the ureter or compressive  We discussed that chronic obstruction can lead to decompensation of the kidney over time  In addition, she was noted to have an elevation in her creatinine which is likely secondary to obstruction which may or may not be reversible at this point pending on the  chronicity.  I have offered her to pursue cystoscopy, ureteral stent placement versus percutaneous nephrostomy tube in order to decompress her kidney, optimize function and possibly be able to better tolerate chemotherapy.  We discussed ureteral stent placement today at length including the risk of infection, stent irritation, need for stent exchanges amongst others.  We also discussed the possibility of stent failure and advanced malignancy.  She is agreeable this plan and would like to pursue stent placement. - Urine culture; Future  2. Urothelial carcinoma ([place]) Urothelial carcinoma as most likely source based on immunohistochemical pathology  We did discuss in tumor board that there is very little residual tissue for additional staining and  testing thus being able to provide an additional specimen would be beneficial.  We could pursue this bladder biopsy, diagnostic ureteroscopy, possible ureteral biopsy at the time of aforementioned stent placement to help facilitate this.  She is agreeable to plan and understands the risk and benefits as well.  - Urine culture; Future  3. Flank pain Right lower quadrant pain, presumably related to malignancy - Urine culture; Future  4. AKI (acute kidney injury) ([place]) Discussion as above, most recent creatinine up to 1.35 from baseline of 0.9   Hollice Espy, MD  [facility] [address] [place], [place] [address] [phone]  I spent 45 min with this patient of which greater than 50% was spent in counseling and coordination of care with the patient.

## 2018-02-01 NOTE — H&P (View-Only) (Signed)
02/01/2018 1:09 PM   Kristi Thompson 11-Feb-1924 [id number]  Referring provider: [name], MD [facility] [place], [place] [address]  Chief Complaint  Patient presents with  . Hydronephrosis    HPI: 84 year old female with metastatic carcinoma of unknown primary presenting today for for consideration of cystoscopy, right ureteral stent, further diagnostic evaluation.  She was initially seen and evaluated for right lower quadrant pain.  She underwent a CT scan demonstrating a possible colonic mass, right hydronephrosis, and retroperitoneal lymphadenopathy including a soft tissue mass in the right pelvis measuring 3.9 cm.  There is concern for right distal ureteral obstruction above the level of the right soft tissue mass possibly even involving the ureter.  He was subsequently followed up with a PET scan on 01/04/2018 showing hypermetabolic transverse colon and hypermetabolic lymph nodes, diffuse metastatic disease.  She underwent CT-guided biopsy of a right periadrenal lymph node which is consistent with carcinoma of unknown primary.  Reviewed yesterday tumor board and this poorly differentiated carcinoma was most consistent with favoring urothelial origin but nondiagnostic.  The patient denies gross hematuria.  She has some persistent right lower quadrant pain but no overt right flank pain.  Cr 1.35 which is down from October at a peak of 1.7 but baseline appears to be 0.9 dating back to 2016.  She is a non-smoker.  She was exposed to secondhand smoke.  For age, she is quite good functional status.    She is now followed by Dr. Grayland Ormond, medical oncology, and scheduled to begin chemo in 2 weeks.   PMH: Past Medical History:  Diagnosis Date  . Asthma   . Cancer ([place])    colon ca  . COPD (chronic obstructive pulmonary disease) ([place])   . GERD (gastroesophageal reflux disease)     Surgical History: Past Surgical History:  Procedure  Laterality Date  . ABDOMINAL HYSTERECTOMY    . bowel obstruction    . CHOLECYSTECTOMY    . COLON SURGERY     colon cancer  . COLONOSCOPY WITH PROPOFOL N/A 01/18/2018   Procedure: COLONOSCOPY WITH PROPOFOL;  Surgeon: Jonathon Bellows, MD;  Location: [facility] ENDOSCOPY;  Service: Gastroenterology;  Laterality: N/A;  . THYROIDECTOMY      Home Medications:  Allergies as of 02/01/2018      Reactions   Demerol [meperidine] Swelling   Sulfa Antibiotics Swelling      Medication List        Accurate as of 02/01/18  1:09 PM. Always use your most recent med list.          acetaminophen 325 MG tablet Commonly known as:  TYLENOL Take 325 mg by mouth every 6 (six) hours as needed.   albuterol 108 (90 Base) MCG/ACT inhaler Commonly known as:  PROVENTIL HFA;VENTOLIN HFA Inhale into the lungs.   aspirin EC 81 MG tablet Take by mouth.   Cyanocobalamin 2500 MCG Subl Place under the tongue.   famotidine 40 MG tablet Commonly known as:  PEPCID Take by mouth.   fluticasone 50 MCG/ACT nasal spray Commonly known as:  FLONASE USE 2 SPRAYS INTO BOTH     NOSTRILS NIGHTLY   HYDROcodone-acetaminophen 5-325 MG tablet Commonly known as:  NORCO/VICODIN TK 1 T PO Q 6 H PRN   montelukast 10 MG tablet Commonly known as:  SINGULAIR TAKE 1 TABLET DAILY   MULTI-VITAMINS Tabs Take by mouth.   PULMICORT FLEXHALER 180 MCG/ACT inhaler Generic drug:  budesonide Inhale into the lungs.  tamsulosin 0.4 MG Caps capsule Commonly known as:  FLOMAX Take by mouth.       Allergies:  Allergies  Allergen Reactions  . Demerol [Meperidine] Swelling  . Sulfa Antibiotics Swelling    Family History: No family history on file.  Social History:  reports that she has never smoked. She has never used smokeless tobacco. She reports that she drinks alcohol. She reports that she does not use drugs.  ROS: UROLOGY Frequent Urination?: No Hard to postpone urination?: No Burning/pain with urination?: No Get  up at night to urinate?: No Leakage of urine?: No Urine stream starts and stops?: No Trouble starting stream?: No Do you have to strain to urinate?: No Blood in urine?: No Urinary tract infection?: No Sexually transmitted disease?: No Injury to kidneys or bladder?: No Painful intercourse?: No Weak stream?: No Currently pregnant?: No Vaginal bleeding?: No Last menstrual period?: n  Gastrointestinal Nausea?: No Vomiting?: No Indigestion/heartburn?: No Diarrhea?: No Constipation?: No  Constitutional Fever: No Night sweats?: No Weight loss?: No Fatigue?: No  Skin Skin rash/lesions?: No  Eyes Blurred vision?: No Double vision?: No  Ears/Nose/Throat Sore throat?: No Sinus problems?: No  Hematologic/Lymphatic Swollen glands?: No Easy bruising?: No  Cardiovascular Leg swelling?: No Chest pain?: No  Respiratory Cough?: No Shortness of breath?: No  Endocrine Excessive thirst?: No  Musculoskeletal Back pain?: No Joint pain?: No  Neurological Headaches?: No Dizziness?: No  Psychologic Depression?: No Anxiety?: No  Physical Exam: BP 124/65   Pulse 90   Wt 140 lb (63.5 kg)   LMP  (LMP Unknown)   BMI 26.45 kg/m   Constitutional:  Alert and oriented, No acute distress.  Accompanied by daughter today. HEENT: [place] AT, moist mucus membranes.  Trachea midline, no masses. Cardiovascular: No clubbing, cyanosis, or edema. Respiratory: Normal respiratory effort, no increased work of breathing. GI: Abdomen is soft, nontender, nondistended, tenderness with voluntary guarding in the right lower quadrant GU: No CVA tenderness Skin: No rashes, bruises or suspicious lesions. Neurologic: Grossly intact, no focal deficits, moving all 4 extremities. Psychiatric: Normal mood and affect.  Laboratory Data: Lab Results  Component Value Date   WBC 8.9 01/23/2018   HGB 10.7 (L) 01/23/2018   HCT 33.3 (L) 01/23/2018   MCV 90.2 01/23/2018   PLT 301 01/23/2018    Lab  Results  Component Value Date   CREATININE 1.35 (H) 01/18/2018    Urinalysis    Component Value Date/Time   COLORURINE YELLOW (A) 01/18/2018 1854   APPEARANCEUR CLEAR (A) 01/18/2018 1854   LABSPEC 1.040 (H) 01/18/2018 1854   PHURINE 5.0 01/18/2018 1854   GLUCOSEU NEGATIVE 01/18/2018 1854   HGBUR NEGATIVE 01/18/2018 [place] 01/18/2018 [place] 01/18/2018 [place] 01/18/2018 1854   NITRITE NEGATIVE 01/18/2018 1854   LEUKOCYTESUR TRACE (A) 01/18/2018 1854    Lab Results  Component Value Date   BACTERIA NONE SEEN 01/18/2018    Pertinent Imaging: I reviewed CT abdomen pelvis with contrast from 01/18/2018 as well as PET scan from 01/04/2018 and with the radiologist.  Assessment & Plan:    1. Hydronephrosis, unspecified hydronephrosis type Secondary to malignancy, either within the bladder, within the ureter or compressive  We discussed that chronic obstruction can lead to decompensation of the kidney over time  In addition, she was noted to have an elevation in her creatinine which is likely secondary to obstruction which may or may not be reversible at this point pending on the  chronicity.  I have offered her to pursue cystoscopy, ureteral stent placement versus percutaneous nephrostomy tube in order to decompress her kidney, optimize function and possibly be able to better tolerate chemotherapy.  We discussed ureteral stent placement today at length including the risk of infection, stent irritation, need for stent exchanges amongst others.  We also discussed the possibility of stent failure and advanced malignancy.  She is agreeable this plan and would like to pursue stent placement. - Urine culture; Future  2. Urothelial carcinoma ([place]) Urothelial carcinoma as most likely source based on immunohistochemical pathology  We did discuss in tumor board that there is very little residual tissue for additional staining and  testing thus being able to provide an additional specimen would be beneficial.  We could pursue this bladder biopsy, diagnostic ureteroscopy, possible ureteral biopsy at the time of aforementioned stent placement to help facilitate this.  She is agreeable to plan and understands the risk and benefits as well.  - Urine culture; Future  3. Flank pain Right lower quadrant pain, presumably related to malignancy - Urine culture; Future  4. AKI (acute kidney injury) ([place]) Discussion as above, most recent creatinine up to 1.35 from baseline of 0.9   Hollice Espy, MD  [facility] [address] [place], [place] [address] [phone]  I spent 45 min with this patient of which greater than 50% was spent in counseling and coordination of care with the patient.

## 2018-02-03 LAB — URINE CULTURE: Culture: 50000 — AB

## 2018-02-04 ENCOUNTER — Encounter: Payer: Self-pay | Admitting: Gastroenterology

## 2018-02-04 ENCOUNTER — Other Ambulatory Visit: Payer: Self-pay | Admitting: Radiology

## 2018-02-04 ENCOUNTER — Telehealth: Payer: Self-pay | Admitting: Radiology

## 2018-02-04 DIAGNOSIS — C689 Malignant neoplasm of urinary organ, unspecified: Secondary | ICD-10-CM

## 2018-02-04 DIAGNOSIS — N1330 Unspecified hydronephrosis: Secondary | ICD-10-CM

## 2018-02-04 NOTE — Telephone Encounter (Signed)
The [facility] Information form below was discussed with daughter, Marilea Gwynne over the phone.   [place], [place], [place] [address] Telephone: [phone] Fax: [phone]   Thank you for choosing [facility] for your upcoming surgery!  We are always here to assist in your urological needs.  Please read the following information with specific details for your upcoming appointments related to your surgery. Please contact Seila Liston at [phone] Option 3 with any questions.  The Name of Your Surgery: cystoscopy, possible bladder biopsy/transurethral resection of bladder tumor, possible right ureteroscopy, right retrograde pyelogram, right ureteral stent placement, possible ureteral biopsy Your Surgery Date: 02/06/2018 Your Surgeon: Hollice Espy  Please call Same Day Surgery at [phone] between the hours of 1pm-3pm one day prior to your surgery. They will inform you of the time to arrive at Same Day Surgery which is located on the second floor of the [facility].   Please refer to the attached letter regarding instructions for Pre-Admission Testing. You will receive a call from the [facility] office regarding your appointment with them.  The Pre-Admission Testing office is located at [place], on the first floor of the [facility] in [place] (office is to the right as you enter through the [employer] of the [facility]). Please have all medications you are currently taking and your insurance card available.   Patient was advised to have nothing to eat or drink after midnight the night prior to surgery except that she may have only water until 2 hours before surgery with nothing to drink within 2 hours of surgery.  The patient states she currently takes ASA 81mg  & was informed to hold medication  beginning immediately. Daughter's questions were answered and she expressed understanding of these instructions.

## 2018-02-05 ENCOUNTER — Encounter
Admission: RE | Admit: 2018-02-05 | Discharge: 2018-02-05 | Disposition: A | Discharge status: Home / Self Care | Payer: Medicare Other | Source: Ambulatory Visit | Attending: Urology | Admitting: Urology

## 2018-02-05 ENCOUNTER — Other Ambulatory Visit: Payer: Self-pay

## 2018-02-05 DIAGNOSIS — C801 Malignant (primary) neoplasm, unspecified: Secondary | ICD-10-CM | POA: Insufficient documentation

## 2018-02-05 DIAGNOSIS — C779 Secondary and unspecified malignant neoplasm of lymph node, unspecified: Secondary | ICD-10-CM | POA: Insufficient documentation

## 2018-02-05 HISTORY — DX: Personal history of other diseases of the digestive system: Z87.19

## 2018-02-05 MED ORDER — CEFAZOLIN SODIUM-DEXTROSE 1-4 GM/50ML-% IV SOLN
1.0000 g | INTRAVENOUS | Status: AC
  Administered 2018-02-06: 1 g via INTRAVENOUS

## 2018-02-05 NOTE — Patient Instructions (Signed)
Your procedure is scheduled on: February 06, 2018 [facility] Report to Day Surgery on the 2nd floor of the [facility]. To find out your arrival time, please call [phone] between 1PM - 3PM on: Tuesday February 05, 2018  REMEMBER: Instructions that are not followed completely may result in serious medical risk, up to and including death; or upon the discretion of your surgeon and anesthesiologist your surgery may need to be rescheduled.  Do not eat food after midnight the night before surgery.  No gum chewing, lozengers or hard candies.  You may however, drink CLEAR liquids up to 2 hours before you are scheduled to arrive for your surgery. Do not drink anything within 2 hours of the start of your surgery.  Clear liquids include: - water  - apple juice without pulp - gatorade - black coffee or tea (Do NOT add milk or creamers to the coffee or tea) Do NOT drink anything that is not on this list.  Type 1 and Type 2 diabetics should only drink water. No Alcohol for 24 hours before or after surgery.  No Smoking including e-cigarettes for 24 hours prior to surgery.  No chewable tobacco products for at least 6 hours prior to surgery.  No nicotine patches on the day of surgery.  On the morning of surgery brush your teeth with toothpaste and water, you may rinse your mouth with mouthwash if you wish. Do not swallow any toothpaste or mouthwash.  Notify your doctor if there is any change in your medical condition (cold, fever, infection).  Do not wear jewelry, make-up, hairpins, clips or nail polish.  Do not wear lotions, powders, or perfumes.   Do not shave 48 hours prior to surgery.   Contacts and dentures may not be worn into surgery.  Do not bring valuables to the hospital, including drivers license, insurance or credit cards.  [facility] is not responsible for any belongings or valuables.   TAKE THESE MEDICATIONS THE MORNING OF SURGERY:  PEPCID (take one the night before  and one on the morning of surgery - helps to prevent nausea after surgery.)  SHOWER DAY OF SURGERY  Use inhalers on the day of surgery   Follow recommendations from Cardiologist, Pulmonologist or PCP regarding stopping Aspirin - STOPPED February 02, 2018  Stop Anti-inflammatories (NSAIDS) such as Advil, Aleve, Ibuprofen, Motrin, Naproxen, Naprosyn and Aspirin based products such as Excedrin, Goodys Powder, BC Powder. (May take Tylenol or Acetaminophen if needed.)  Stop ANY OVER THE COUNTER supplements until after surgery. (May continue Vitamin D, Vitamin B, and multivitamin.)  Wear comfortable clothing (specific to your surgery type) to the hospital.  Plan for stool softeners for home use.  If you are being discharged the day of surgery, you will not be allowed to drive home. You will need a responsible adult to drive you home and stay with you that night.   If you are taking public transportation, you will need to have a responsible adult with you. Please confirm with your physician that it is acceptable to use public transportation.   Please call [phone] if you have any questions about these instructions.

## 2018-02-06 ENCOUNTER — Encounter
Admission: RE | Disposition: A | Discharge status: Home / Self Care | Payer: Self-pay | Source: Ambulatory Visit | Attending: Urology

## 2018-02-06 ENCOUNTER — Other Ambulatory Visit (INDEPENDENT_AMBULATORY_CARE_PROVIDER_SITE_OTHER): Payer: Self-pay | Admitting: Nurse Practitioner

## 2018-02-06 ENCOUNTER — Other Ambulatory Visit: Payer: Self-pay

## 2018-02-06 ENCOUNTER — Ambulatory Visit: Payer: Medicare Other | Admitting: Anesthesiology

## 2018-02-06 ENCOUNTER — Ambulatory Visit
Admission: RE | Admit: 2018-02-06 | Discharge: 2018-02-06 | Disposition: A | Discharge status: Home / Self Care | Payer: Medicare Other | Source: Ambulatory Visit | Attending: Urology | Admitting: Urology

## 2018-02-06 DIAGNOSIS — J449 Chronic obstructive pulmonary disease, unspecified: Secondary | ICD-10-CM | POA: Insufficient documentation

## 2018-02-06 DIAGNOSIS — Z7982 Long term (current) use of aspirin: Secondary | ICD-10-CM | POA: Insufficient documentation

## 2018-02-06 DIAGNOSIS — C7911 Secondary malignant neoplasm of bladder: Secondary | ICD-10-CM | POA: Insufficient documentation

## 2018-02-06 DIAGNOSIS — N1330 Unspecified hydronephrosis: Secondary | ICD-10-CM | POA: Insufficient documentation

## 2018-02-06 DIAGNOSIS — Z7951 Long term (current) use of inhaled steroids: Secondary | ICD-10-CM | POA: Diagnosis not present

## 2018-02-06 DIAGNOSIS — N179 Acute kidney failure, unspecified: Secondary | ICD-10-CM | POA: Insufficient documentation

## 2018-02-06 DIAGNOSIS — K219 Gastro-esophageal reflux disease without esophagitis: Secondary | ICD-10-CM | POA: Diagnosis not present

## 2018-02-06 DIAGNOSIS — I10 Essential (primary) hypertension: Secondary | ICD-10-CM | POA: Insufficient documentation

## 2018-02-06 DIAGNOSIS — Z79899 Other long term (current) drug therapy: Secondary | ICD-10-CM | POA: Diagnosis not present

## 2018-02-06 DIAGNOSIS — Z85038 Personal history of other malignant neoplasm of large intestine: Secondary | ICD-10-CM | POA: Diagnosis not present

## 2018-02-06 DIAGNOSIS — Z885 Allergy status to narcotic agent status: Secondary | ICD-10-CM | POA: Insufficient documentation

## 2018-02-06 DIAGNOSIS — Z882 Allergy status to sulfonamides status: Secondary | ICD-10-CM | POA: Diagnosis not present

## 2018-02-06 DIAGNOSIS — D4121 Neoplasm of uncertain behavior of right ureter: Secondary | ICD-10-CM

## 2018-02-06 DIAGNOSIS — C801 Malignant (primary) neoplasm, unspecified: Secondary | ICD-10-CM | POA: Diagnosis not present

## 2018-02-06 DIAGNOSIS — C689 Malignant neoplasm of urinary organ, unspecified: Secondary | ICD-10-CM

## 2018-02-06 HISTORY — PX: CYSTOSCOPY W/ URETERAL STENT PLACEMENT: SHX1429

## 2018-02-06 HISTORY — PX: CYSTOSCOPY WITH URETEROSCOPY: SHX5123

## 2018-02-06 HISTORY — PX: URETERAL BIOPSY: SHX6688

## 2018-02-06 SURGERY — CYSTOSCOPY, WITH RETROGRADE PYELOGRAM AND URETERAL STENT INSERTION
Anesthesia: General | Laterality: Right

## 2018-02-06 MED ORDER — ONDANSETRON HCL 4 MG/2ML IJ SOLN: 4.0000 mg | Freq: Once | INTRAMUSCULAR | Status: DC | PRN

## 2018-02-06 MED ORDER — LACTATED RINGERS IV SOLN
INTRAVENOUS | Status: DC
  Administered 2018-02-06: 12:00:00 via INTRAVENOUS

## 2018-02-06 MED ORDER — FENTANYL CITRATE (PF) 100 MCG/2ML IJ SOLN
INTRAMUSCULAR | Status: AC
  Filled 2018-02-06: qty 2

## 2018-02-06 MED ORDER — CEPHALEXIN 500 MG PO CAPS: 500.0000 mg | ORAL_CAPSULE | Freq: Four times a day (QID) | ORAL | 0 refills | Status: AC

## 2018-02-06 MED ORDER — IOPAMIDOL (ISOVUE-200) INJECTION 41%
INTRAVENOUS | Status: DC | PRN
  Administered 2018-02-06: 50 mL

## 2018-02-06 MED ORDER — EPHEDRINE SULFATE 50 MG/ML IJ SOLN
INTRAMUSCULAR | Status: DC | PRN
  Administered 2018-02-06 (×2): 10 mg via INTRAVENOUS

## 2018-02-06 MED ORDER — LIDOCAINE HCL (CARDIAC) PF 100 MG/5ML IV SOSY
PREFILLED_SYRINGE | INTRAVENOUS | Status: DC | PRN
  Administered 2018-02-06: 60 mg via INTRAVENOUS

## 2018-02-06 MED ORDER — CEFAZOLIN SODIUM-DEXTROSE 1-4 GM/50ML-% IV SOLN
INTRAVENOUS | Status: AC
  Filled 2018-02-06: qty 50

## 2018-02-06 MED ORDER — PROPOFOL 10 MG/ML IV BOLUS
INTRAVENOUS | Status: DC | PRN
  Administered 2018-02-06: 80 mg via INTRAVENOUS

## 2018-02-06 MED ORDER — MIRABEGRON ER 25 MG PO TB24: 25.0000 mg | ORAL_TABLET | Freq: Every day | ORAL | 4 refills | Status: DC

## 2018-02-06 MED ORDER — FENTANYL CITRATE (PF) 100 MCG/2ML IJ SOLN
INTRAMUSCULAR | Status: DC | PRN
  Administered 2018-02-06 (×2): 25 ug via INTRAVENOUS

## 2018-02-06 MED ORDER — FENTANYL CITRATE (PF) 100 MCG/2ML IJ SOLN: 25.0000 ug | INTRAMUSCULAR | Status: DC | PRN

## 2018-02-06 SURGICAL SUPPLY — 40 items
BAG DRAIN CYSTO-URO LG1000N (MISCELLANEOUS) ×4 IMPLANT
BAG URINE DRAINAGE (UROLOGICAL SUPPLIES) ×4 IMPLANT
BASKET 3WIRE GEMINI 2.4X120 (MISCELLANEOUS) ×3 IMPLANT
BRUSH SCRUB EZ  4% CHG (MISCELLANEOUS) ×2
BRUSH SCRUB EZ 4% CHG (MISCELLANEOUS) ×2 IMPLANT
CATH FOLEY 2WAY  5CC 16FR (CATHETERS) ×2
CATH URETL 5X70 OPEN END (CATHETERS) ×4 IMPLANT
CATH URTH 16FR FL 2W BLN LF (CATHETERS) ×2 IMPLANT
CONRAY 43 FOR UROLOGY 50M (MISCELLANEOUS) ×4 IMPLANT
CRADLE LAMINECT ARM (MISCELLANEOUS) ×4 IMPLANT
DRAPE UTILITY 15X26 TOWEL STRL (DRAPES) ×4 IMPLANT
DRSG TELFA 4X3 1S NADH ST (GAUZE/BANDAGES/DRESSINGS) ×4 IMPLANT
ELECT LOOP 22F BIPOLAR SML (ELECTROSURGICAL)
ELECT REM PT RETURN 9FT ADLT (ELECTROSURGICAL) ×4
ELECTRODE LOOP 22F BIPOLAR SML (ELECTROSURGICAL) IMPLANT
ELECTRODE REM PT RTRN 9FT ADLT (ELECTROSURGICAL) ×2 IMPLANT
FORCEPS BIOP PIRANHA Y (CUTTING FORCEPS) IMPLANT
GLIDEWIRE STIFF .35X180X3 HYDR (WIRE) ×3 IMPLANT
GLOVE BIO SURGEON STRL SZ 6.5 (GLOVE) ×5 IMPLANT
GLOVE BIO SURGEONS STRL SZ 6.5 (GLOVE) ×2
GOWN STRL REUS W/ TWL LRG LVL3 (GOWN DISPOSABLE) ×4 IMPLANT
GOWN STRL REUS W/TWL LRG LVL3 (GOWN DISPOSABLE) ×4
INFUSOR MANOMETER BAG 3000ML (MISCELLANEOUS) ×4 IMPLANT
KIT TURNOVER CYSTO (KITS) ×4 IMPLANT
LOOP CUT BIPOLAR 24F LRG (ELECTROSURGICAL) IMPLANT
NDL SAFETY ECLIPSE 18X1.5 (NEEDLE) ×1 IMPLANT
NEEDLE HYPO 18GX1.5 SHARP (NEEDLE)
PACK CYSTO AR (MISCELLANEOUS) ×4 IMPLANT
SENSORWIRE 0.038 NOT ANGLED (WIRE) ×4
SET CYSTO W/LG BORE CLAMP LF (SET/KITS/TRAYS/PACK) ×4 IMPLANT
SET IRRIG Y TYPE TUR BLADDER L (SET/KITS/TRAYS/PACK) ×4 IMPLANT
SOL .9 NS 3000ML IRR  AL (IV SOLUTION) ×2
SOL .9 NS 3000ML IRR UROMATIC (IV SOLUTION) ×2 IMPLANT
STENT URET 6FRX24 CONTOUR (STENTS) ×4 IMPLANT
STENT URET 6FRX26 CONTOUR (STENTS) ×1 IMPLANT
SURGILUBE 2OZ TUBE FLIPTOP (MISCELLANEOUS) ×4 IMPLANT
SYRINGE IRR TOOMEY STRL 70CC (SYRINGE) ×4 IMPLANT
WATER STERILE IRR 1000ML POUR (IV SOLUTION) ×4 IMPLANT
WATER STERILE IRR 3000ML UROMA (IV SOLUTION) ×1 IMPLANT
WIRE SENSOR 0.038 NOT ANGLED (WIRE) ×2 IMPLANT

## 2018-02-06 NOTE — Progress Notes (Signed)
   02/06/18 0900  Clinical Encounter Type  Visited With Patient;Family  Visit Type Initial;Spiritual support;Other (Comment) (Completion of AD)  Recommendations Follow-up in SDS  Spiritual Encounters  Spiritual Needs Emotional;Other (Comment)  Stress Factors  Patient Stress Factors Health changes;Other (Comment) (Upcoming surgery.)  Advance Directives (For Healthcare)  Does Patient Have a Medical Advance Directive? Yes  Type of Paramedic of [place];Living will  Copy of [place] in Chart?  (Hardcopy in file.)  Copy of Living Will in Chart?  (Hardcopy in file.)   Chaplain met with family, addressed the patient's concerns and facilitated the completion of her AD. A notarized copy was placed in her file. Chaplain provided active listening and emotional support for the patient and family.

## 2018-02-06 NOTE — Op Note (Signed)
Date of procedure: 02/06/18  Preoperative diagnosis:  1. Right hydroureteronephrosis 2. 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 Acute kidney injury  Postoperative diagnosis:  1. Same as above  Procedure: 1. Cystoscopy 2. Right retrograde pyelogram 3. Right ureteroscopy 4. Right ureteral biopsy 5. Failed ureteral stent placement  Surgeon: Hollice Espy, MD  Anesthesia: General  Complications: None  Intraoperative findings: Bladder unremarkable.  Right retrograde pyelogram with obstructing distal ureter opening into the dilated mid ureter, blind ending.  Necrotic large tumor burden within distal ureter, biopsied.  Unable to place stent.  EBL: Minimal  Specimens: Right ureteral cytology, right ureteral biopsy  Drains: None  Indication: Kristi Thompson is an 84 y.o. patient with metastatic carcinoma of unknown primary, presumably urothelial with high-grade ureteral obstruction on right and acute kidney injury.  After reviewing the management options for treatment, she elected to proceed with the above surgical procedure(s). We have discussed the potential benefits and risks of the procedure, side effects of the proposed treatment, the likelihood of the patient achieving the goals of the procedure, and any potential problems that might occur during the procedure or recuperation. Informed consent has been obtained.  Description of procedure:  The patient was taken to the operating room and general anesthesia was induced.  The patient was placed in the dorsal lithotomy position, prepped and draped in the usual sterile fashion, and preoperative antibiotics were administered. A preoperative time-out was performed.   1 French cystoscope was advanced per urethra into the bladder.  The bladder was carefully inspected and noted to be mildly trabeculated but there is no evidence of tumor within the bladder itself.  The right UO did have a somewhat heaped up appearance.  The UO was  then intubated using a 5 [place] open-ended ureteral catheter and gentle retrograde Polygram was performed.  This was grossly abnormal with the initial minimal manipulation of the UO well, there was very slow reflux of almost like material dribbling from the UO itself.  Within very carefully under lower pressure injected contrast solution and initially, there is some concern for possible blushing of the bowel concerning for fistulous tract.  This is the only time this was ever appreciated during the procedure and had subsequent retrogrades was not appreciated.  Made several attempts at this point to advance a wire and upon doing so was able to advance the wire to the level of the iliacs.  I then attempted to place open-ended over the wire but was only able to advance it slightly into the distal ureter.  Under higher pressure, injected contrast solution in the distal ureter was almost completely obliterated opening up into a widely dilated ureter which appeared to be blind ending with a filling defect within.  I was able to coil the angled Glidewire at location.  As to gain access into the more proximal ureter were obtained specimen, I advanced this 4.5 semirigid scope alongside the wire very carefully and there was significant extrinsic compression of the distal ureter which was nearly obliterated.  I was unable to advance a wire to the mid distal ureter near the level of the iliacs where the ureter opened up and the urothelium appeared almost normal.  1 cm beyond this, there was shaggy necrotic tumor within the lumen of the ureter likely representing the known carcinoma.  I injected contrast through the scope here and it appeared to be blind ending with absolutely no reflux above a blind-ending pouch.  I aspirated fluid, approximately 10 cc and sent  this for cytology.  I then used a [employer] to grab a large piece of tissue which was somewhat necrotic in appearance and pulled out of the ureter.  Unfortunately,  due to the very small lumen and compression on the very distal aspect of the ureter, was not able to get large pieces.  I was able to pass the scope several additional times to grab small minute pieces of tissue which appeared to be more viable, pink in appearance.  I made several final attempts under direct visualization to advance several types of wires including a sensor wire and angled Glidewire unsuccessfully.  This point in time, the procedure was deemed complete.  All scopes were removed and the bladder was drained.  The patient was then clean and dry, repositioned in supine position, reversed from anesthesia, and taken to the PACU in stable condition.  Plan: Findings were discussed with multiple family members today.  We discussed that I was able to obtain additional tissue which may or may not be viable and even further diagnostic and possibly therapeutic intervention.  I was unable to place a ureteral stent which was discussed in detail.  At this point, her options include allowing the kidney to remain obstructed and atrophy and the risk and benefits were the discussed including risk of infection within the obstructed system as well as further and continued compromise of the renal function with atrophy.  Alternatively, we discussed the option of percutaneous nephrostomy tube.  Her family believes that she would not be interested in a percutaneous nephrostomy tube.  I will have her return to the office after Thanksgiving weekend to discuss this further and ensure that she is happy with this decision.  As a precaution, I will send her home on Keflex for several days.  Hollice Espy, M.D.

## 2018-02-06 NOTE — Transfer of Care (Signed)
Immediate Anesthesia Transfer of Care Note  Patient: Kristi Thompson  Procedure(s) Performed: CYSTOSCOPY WITH RETROGRADE PYELOGRAM (Right ) CYSTOSCOPY WITH URETEROSCOPY (Right ) URETERAL BIOPSY (Right )  Patient Location: PACU  Anesthesia Type:General  Level of Consciousness: sedated  Airway & Oxygen Therapy: Patient Spontanous Breathing and Patient connected to face mask oxygen  Post-op Assessment: Report given to RN  Post vital signs: Reviewed and stable  Last Vitals:  Vitals Value Taken Time  BP 127/49 02/06/2018  1:15 PM  Temp    Pulse 78 02/06/2018  1:17 PM  Resp 15 02/06/2018  1:17 PM  SpO2 100 % 02/06/2018  1:17 PM  Vitals shown include unvalidated device data.  Last Pain:  Vitals:   02/06/18 1021  TempSrc: Temporal  PainSc: 0-No pain         Complications: No apparent anesthesia complications

## 2018-02-06 NOTE — Anesthesia Procedure Notes (Signed)
Procedure Name: LMA Insertion Date/Time: 02/06/2018 12:22 PM Performed by: Dionne Bucy, CRNA Pre-anesthesia Checklist: Patient identified, Patient being monitored, Timeout performed, Emergency Drugs available and Suction available Patient Re-evaluated:Patient Re-evaluated prior to induction Oxygen Delivery Method: Circle system utilized Preoxygenation: Pre-oxygenation with 100% oxygen Induction Type: IV induction Ventilation: Mask ventilation without difficulty LMA: LMA inserted LMA Size: 4.0 Tube type: Oral Number of attempts: 1 Placement Confirmation: positive ETCO2 and breath sounds checked- equal and bilateral Tube secured with: Tape Dental Injury: Teeth and Oropharynx as per pre-operative assessment

## 2018-02-06 NOTE — Anesthesia Preprocedure Evaluation (Signed)
Anesthesia Evaluation  Patient identified by MRN, date of birth, ID band Patient awake    Reviewed: Allergy & Precautions, NPO status , Patient's Chart, lab work & pertinent test results, reviewed documented beta blocker date and time   History of Anesthesia Complications Negative for: history of anesthetic complications  Airway Mallampati: II  TM Distance: >3 FB     Dental  (+) Chipped, Dental Advidsory Given   Pulmonary neg shortness of breath, asthma , neg sleep apnea, COPD, neg recent URI,           Cardiovascular hypertension, (-) angina(-) CAD, (-) Past MI, (-) Cardiac Stents and (-) CABG + dysrhythmias (RBBB) (-) Valvular Problems/Murmurs     Neuro/Psych negative neurological ROS     GI/Hepatic Neg liver ROS, hiatal hernia, GERD  ,  Endo/Other  negative endocrine ROS  Renal/GU CRFRenal disease (kidney stones)     Musculoskeletal   Abdominal   Peds  Hematology   Anesthesia Other Findings Past Medical History: No date: Asthma 1978: Cancer ([place])     Comment:  colon ca No date: COPD (chronic obstructive pulmonary disease) ([facility]) No date: GERD (gastroesophageal reflux disease) No date: History of hiatal hernia   Reproductive/Obstetrics                             Anesthesia Physical  Anesthesia Plan  ASA: III  Anesthesia Plan: General   Post-op Pain Management:    Induction: Intravenous  PONV Risk Score and Plan: 3 and Ondansetron, Dexamethasone and Treatment may vary due to age or medical condition  Airway Management Planned: LMA  Additional Equipment:   Intra-op Plan:   Post-operative Plan: Extubation in OR  Informed Consent: I have reviewed the patients History and Physical, chart, labs and discussed the procedure including the risks, benefits and alternatives for the proposed anesthesia with the patient or authorized representative who has indicated his/her  understanding and acceptance.     Plan Discussed with: CRNA  Anesthesia Plan Comments:         Anesthesia Quick Evaluation

## 2018-02-06 NOTE — Discharge Instructions (Signed)
You have a ureteral stent in place.  This is a tube that extends from your kidney to your bladder.  This may cause urinary bleeding, burning with urination, and urinary frequency.  Please call our office or present to the ED if you develop fevers >101 or pain which is not able to be controlled with oral pain medications.  You may be given either Flomax and/ or ditropan to help with bladder spasms and stent pain in addition to pain medications.    You will be given a prescription of a medication called Myrbetriq for urinary issues including urgency or frequency.  Take this medication if you need it.  [facility] [address] [place], [place] [address] [phone]  AMBULATORY SURGERY  DISCHARGE INSTRUCTIONS   1) The drugs that you were given will stay in your system until tomorrow so for the next 24 hours you should not:  A) Drive an automobile B) Make any legal decisions C) Drink any alcoholic beverage   2) You may resume regular meals tomorrow.  Today it is better to start with liquids and gradually work up to solid foods.  You may eat anything you prefer, but it is better to start with liquids, then soup and crackers, and gradually work up to solid foods.   3) Please notify your doctor immediately if you have any unusual bleeding, trouble breathing, redness and pain at the surgery site, drainage, fever, or pain not relieved by medication.    4) Additional Instructions:        Please contact your physician with any problems or Same Day Surgery at [phone], Monday through Friday 6 am to 4 pm, or [place] [facility] number at [phone].

## 2018-02-06 NOTE — Anesthesia Postprocedure Evaluation (Signed)
Anesthesia Post Note  Patient: [name]  Procedure(s) Performed: CYSTOSCOPY WITH RETROGRADE PYELOGRAM (Right ) CYSTOSCOPY WITH URETEROSCOPY (Right ) URETERAL BIOPSY (Right )  Patient location during evaluation: PACU Anesthesia Type: General Level of consciousness: awake and alert Pain management: pain level controlled Vital Signs Assessment: post-procedure vital signs reviewed and stable Respiratory status: spontaneous breathing, nonlabored ventilation, respiratory function stable and patient connected to nasal cannula oxygen Cardiovascular status: blood pressure returned to baseline and stable Postop Assessment: no apparent nausea or vomiting Anesthetic complications: no     Last Vitals:  Vitals:   02/06/18 1400 02/06/18 1408  BP: (!) 138/54 (!) 152/60  Pulse: 73 68  Resp: 18 18  Temp: (!) 36.3 C (!) 36.2 C  SpO2: 98% 99%    Last Pain:  Vitals:   02/06/18 1408  TempSrc: Temporal  PainSc: 3                  Martha Clan

## 2018-02-06 NOTE — Anesthesia Post-op Follow-up Note (Signed)
Anesthesia QCDR form completed.        

## 2018-02-06 NOTE — Interval H&P Note (Signed)
History and Physical Interval Note:  02/06/2018 11:16 AM  Lanetta I Quito  has presented today for surgery, with the diagnosis of Hydronephrosis, urothelial carcinoma  The various methods of treatment have been discussed with the patient and family. After consideration of risks, benefits and other options for treatment, the patient has consented to  Procedure(s): CYSTOSCOPY WITH RETROGRADE PYELOGRAM/URETERAL STENT PLACEMENT (Right) TRANSURETHRAL RESECTION OF BLADDER TUMOR (TURBT) (N/A) CYSTOSCOPY WITH Bladder BIOPSY (N/A) CYSTOSCOPY WITH URETEROSCOPY (Right) URETERAL BIOPSY (Right) as a surgical intervention .  The patient's history has been reviewed, patient examined, no change in status, stable for surgery.  I have reviewed the patient's chart and labs.  Questions were answered to the patient's satisfaction.    RRR CTAB  Preop UCx with mixed, no need to treat  Hollice Espy

## 2018-02-08 LAB — CYTOLOGY - NON PAP

## 2018-02-10 NOTE — Progress Notes (Deleted)
Kristi Thompson  [phone] [phone]) [phone]  ID: Kristi Thompson OB: 02-01-1924  MR#: [id number]  XBD#:[id number]  Patient Care Team: [name], MD as PCP - General (Internal Medicine) Clent Jacks, RN as Registered Nurse  CHIEF COMPLAINT: High-grade carcinoma.  INTERVAL HISTORY: Patient returns to clinic today for further evaluation and discuss her biopsy results.  She is anxious, but otherwise feels well.  She does not complain of pain today.  She has no neurologic complaints.  She denies any fevers or night sweats.  She has no chest pain, shortness of breath, cough, or hemoptysis.  She denies any nausea, vomiting, constipation, or diarrhea.  She has noted no changes in her bowel movements.  She denies any melena or hematochezia.  She has no urinary complaints.  Patient offers no further specific complaints today.  REVIEW OF SYSTEMS:   Review of Systems  Constitutional: Negative.  Negative for fever, malaise/fatigue and weight loss.  Respiratory: Negative.  Negative for cough, hemoptysis and shortness of breath.   Cardiovascular: Negative.  Negative for chest pain and leg swelling.  Gastrointestinal: Negative for abdominal pain, blood in stool, constipation, diarrhea, melena, nausea and vomiting.  Genitourinary: Negative.  Negative for dysuria and hematuria.  Musculoskeletal: Negative.  Negative for back pain.  Skin: Negative.  Negative for rash.  Neurological: Negative.  Negative for dizziness, focal weakness, weakness and headaches.  Psychiatric/Behavioral: The patient is nervous/anxious.     As per HPI. Otherwise, a complete review of systems is negative.  PAST MEDICAL HISTORY: Past Medical History:  Diagnosis Date  . Asthma   . Cancer ([place]) 1978   colon ca  . COPD (chronic obstructive pulmonary disease) ([place])   . GERD (gastroesophageal reflux disease)   . History of hiatal hernia     PAST SURGICAL HISTORY: Past Surgical  History:  Procedure Laterality Date  . ABDOMINAL HYSTERECTOMY    . APPENDECTOMY    . bowel obstruction    . CATARACT EXTRACTION W/ INTRAOCULAR LENS  IMPLANT, BILATERAL    . CHOLECYSTECTOMY    . COLON SURGERY     colon cancer  . COLONOSCOPY WITH PROPOFOL N/A 01/18/2018   Procedure: COLONOSCOPY WITH PROPOFOL;  Surgeon: Jonathon Bellows, MD;  Location: [facility] ENDOSCOPY;  Service: Gastroenterology;  Laterality: N/A;  . CYSTOSCOPY W/ URETERAL STENT PLACEMENT Right 02/06/2018   Procedure: CYSTOSCOPY WITH RETROGRADE PYELOGRAM;  Surgeon: Hollice Espy, MD;  Location: [facility] ORS;  Service: Urology;  Laterality: Right;  . CYSTOSCOPY WITH URETEROSCOPY Right 02/06/2018   Procedure: CYSTOSCOPY WITH URETEROSCOPY;  Surgeon: Hollice Espy, MD;  Location: [facility] ORS;  Service: Urology;  Laterality: Right;  . THYROID SURGERY     nodule removed  . THYROIDECTOMY    . TONSILLECTOMY    . URETERAL BIOPSY Right 02/06/2018   Procedure: URETERAL BIOPSY;  Surgeon: Hollice Espy, MD;  Location: [facility] ORS;  Service: Urology;  Laterality: Right;    FAMILY HISTORY: Negative and noncontributory. No family history on file.  ADVANCED DIRECTIVES (Y/N):  N  HEALTH MAINTENANCE: Social History   Tobacco Use  . Smoking status: Never Smoker  . Smokeless tobacco: Never Used  Substance Use Topics  . Alcohol use: Yes    Comment: rarely  . Drug use: No     Colonoscopy:  PAP:  Bone density:  Lipid panel:  Allergies  Allergen Reactions  . Demerol [Meperidine] Swelling  . Sulfa Antibiotics Swelling    Current Outpatient Medications  Medication Sig Dispense Refill  . acetaminophen (TYLENOL)  325 MG tablet Take 325 mg by mouth every 6 (six) hours as needed.    Marland Kitchen albuterol (PROVENTIL HFA;VENTOLIN HFA) 108 (90 Base) MCG/ACT inhaler Inhale into the lungs.    Marland Kitchen aspirin EC 81 MG tablet Take by mouth.    . budesonide (PULMICORT FLEXHALER) 180 MCG/ACT inhaler Inhale into the lungs.    . Cyanocobalamin 2500 MCG SUBL Place  under the tongue.    . famotidine (PEPCID) 40 MG tablet Take by mouth.    . fluticasone (FLONASE) 50 MCG/ACT nasal spray USE 2 SPRAYS INTO BOTH     NOSTRILS NIGHTLY    . HYDROcodone-acetaminophen (NORCO/VICODIN) 5-325 MG tablet TK 1 T PO Q 6 H PRN  0  . mirabegron ER (MYRBETRIQ) 25 MG TB24 tablet Take 1 tablet (25 mg total) by mouth daily. 30 tablet 4  . montelukast (SINGULAIR) 10 MG tablet TAKE 1 TABLET DAILY    . Multiple Vitamin (MULTI-VITAMINS) TABS Take by mouth.    . tamsulosin (FLOMAX) 0.4 MG CAPS capsule Take by mouth.     No current facility-administered medications for this visit.     OBJECTIVE: There were no vitals filed for this visit.   There is no height or weight on file to calculate BMI.    ECOG FS:0 - Asymptomatic  General: Well-developed, well-nourished, no acute distress. Eyes: Pink conjunctiva, anicteric sclera. HEENT: Normocephalic, moist mucous membranes, clear oropharnyx. Lungs: Clear to auscultation bilaterally. Heart: Regular rate and rhythm. No rubs, murmurs, or gallops. Abdomen: Soft, nontender, nondistended. No organomegaly noted, normoactive bowel sounds. Musculoskeletal: No edema, cyanosis, or clubbing. Neuro: Alert, answering all questions appropriately. Cranial nerves grossly intact. Skin: No rashes or petechiae noted. Psych: Normal affect. Lymphatics: No cervical, calvicular, axillary or inguinal LAD.   LAB RESULTS:  Lab Results  Component Value Date   NA 140 01/18/2018   K 3.9 01/18/2018   CL 108 01/18/2018   CO2 24 01/18/2018   GLUCOSE 142 (H) 01/18/2018   BUN 23 01/18/2018   CREATININE 1.35 (H) 01/18/2018   CALCIUM 9.1 01/18/2018   PROT 7.0 01/18/2018   ALBUMIN 3.7 01/18/2018   AST 30 01/18/2018   ALT 23 01/18/2018   ALKPHOS 132 (H) 01/18/2018   BILITOT 0.4 01/18/2018   GFRNONAA 33 (L) 01/18/2018   GFRAA 38 (L) 01/18/2018    Lab Results  Component Value Date   WBC 8.9 01/23/2018   NEUTROABS 11.5 (H) 01/18/2018   HGB 10.7 (L)  01/23/2018   HCT 33.3 (L) 01/23/2018   MCV 90.2 01/23/2018   PLT 301 01/23/2018     STUDIES: Ct Abdomen Pelvis W Contrast  Result Date: 01/18/2018 CLINICAL DATA:  Generalized abdominal pain status post colonoscopy. EXAM: CT ABDOMEN AND PELVIS WITH CONTRAST TECHNIQUE: Multidetector CT imaging of the abdomen and pelvis was performed using the standard protocol following bolus administration of intravenous contrast. CONTRAST:  65mL OMNIPAQUE IOHEXOL 300 MG/ML  SOLN COMPARISON:  CT scan of December 27, 2017. FINDINGS: Lower chest: No acute abnormality. Hepatobiliary: No focal liver abnormality is seen. No gallstones, gallbladder wall thickening, or biliary dilatation. Pancreas: Unremarkable. No pancreatic ductal dilatation or surrounding inflammatory changes. Spleen: Normal in size without focal abnormality. Adrenals/Urinary Tract: 2.1 cm right adrenal mass is noted concerning for metastatic disease. Left adrenal gland appears normal. Small left renal cysts are noted. Severe right hydronephrosis is noted, with ureteral obstruction secondary to malignant neoplasm or metastatic disease in the right retroperitoneal region. Urinary bladder is unremarkable. Stomach/Bowel: The stomach appears normal. There is no  evidence of bowel obstruction or inflammation. Irregular soft tissue mass is seen involving the transverse colon consistent with malignancy. It measures 6.9 x 3.9 cm. The appendix is not visualized. Vascular/Lymphatic: Atherosclerosis of abdominal aorta is noted. 2.3 cm right periaortic lymph node is noted. 12 mm right periaortic lymph node is noted more inferiorly. Reproductive: Status post hysterectomy. No adnexal masses. Other: No hernia or fluid collection is noted. 4.3 x 6.4 cm irregularly enhancing mass is noted in the right retroperitoneal region along the course of the right ureter consistent with malignancy or metastatic disease. No pneumoperitoneum is noted. Musculoskeletal: No acute or significant  osseous findings. IMPRESSION: 6.9 x 3.9 cm irregular mass seen in transverse colon consistent with colonic malignancy. Right adrenal mass is noted as well as right periaortic adenopathy consistent with metastatic disease. Severe right hydronephrosis is noted secondary to obstruction from large irregular mass seen in the right retroperitoneal region in the expected location of the right ureter. This is concerning for metastatic disease or malignancy. Aortic Atherosclerosis (ICD10-I70.0). Electronically Signed   By: Marijo Conception, M.D.   On: 01/18/2018 18:28   Ct Biopsy  Result Date: 01/23/2018 INDICATION: Right Peri adrenal adenopathy EXAM: CT BIOPSY MEDICATIONS: None. ANESTHESIA/SEDATION: Fentanyl 50 mcg IV; Versed 1 mg IV Moderate Sedation Time:  8 minutes The patient was continuously monitored during the procedure by the interventional radiology nurse under my direct supervision. FLUOROSCOPY TIME:  Fluoroscopy Time:  minutes  seconds ( mGy). COMPLICATIONS: None immediate. PROCEDURE: Informed written consent was obtained from the patient after a thorough discussion of the procedural risks, benefits and alternatives. All questions were addressed. Maximal Sterile Barrier Technique was utilized including caps, mask, sterile gowns, sterile gloves, sterile drape, hand hygiene and skin antiseptic. A timeout was performed prior to the initiation of the procedure. Under CT guidance, a(n) 17 gauge guide needle was advanced into the right Peri adrenal lymph node. Subsequently, 3 18 gauge core biopsies were obtained. The guide needle was removed. Post biopsy images demonstrate no hemorrhage. Patient tolerated the procedure well without complication. Vital sign monitoring by nursing staff during the procedure will continue as patient is in the special procedures unit for post procedure observation. FINDINGS: The images document guide needle placement within the right Peri adrenal lymph node. Post biopsy images  demonstrate no hemorrhage. IMPRESSION: CT-guided core biopsy of a right Peri adrenal lymph node. Electronically Signed   By: Marybelle Killings M.D.   On: 01/23/2018 12:52    ASSESSMENT: High-grade carcinoma.  PLAN:    1.  High-grade carcinoma: CT-guided biopsy confirmed malignancy, but additional testing is pending to determine a tissue of origin.  Colonoscopy completed last week did not reveal any distinct malignancy making colon cancer less likely.  Pathology did not feel this was a lymphoma.  Tumor markers are all within normal limits other than a mildly elevated CEA at 8.4.  Given the extent of disease, surgery and XRT likely not possible, patient wishes to pursue chemotherapy once a final diagnosis is obtained.  A referral has been sent for port placement.  Patient will also be set for chemo class as well as to initiate treatment.  Return to clinic on February 13, 2018 to initiate cycle 1 of chemotherapy.    I spent a total of 30 minutes face-to-face with the patient of which greater than 50% of the visit was spent in counseling and coordination of care as detailed above.  Patient expressed understanding and was in agreement with this plan. She also  understands that She can call clinic at any time with any questions, concerns, or complaints.   Cancer Staging No matching staging information was found for the patient.  Lloyd Huger, MD   02/10/2018 11:17 PM

## 2018-02-11 ENCOUNTER — Telehealth: Payer: Self-pay | Admitting: Urology

## 2018-02-11 ENCOUNTER — Encounter: Payer: Self-pay | Admitting: Emergency Medicine

## 2018-02-11 ENCOUNTER — Encounter
Admission: RE | Disposition: A | Discharge status: Home / Self Care | Payer: Self-pay | Source: Ambulatory Visit | Attending: Vascular Surgery

## 2018-02-11 ENCOUNTER — Ambulatory Visit
Admission: RE | Admit: 2018-02-11 | Discharge: 2018-02-11 | Disposition: A | Discharge status: Home / Self Care | Payer: Medicare Other | Source: Ambulatory Visit | Attending: Vascular Surgery | Admitting: Vascular Surgery

## 2018-02-11 DIAGNOSIS — Z9049 Acquired absence of other specified parts of digestive tract: Secondary | ICD-10-CM | POA: Diagnosis not present

## 2018-02-11 DIAGNOSIS — E890 Postprocedural hypothyroidism: Secondary | ICD-10-CM | POA: Diagnosis not present

## 2018-02-11 DIAGNOSIS — Z9889 Other specified postprocedural states: Secondary | ICD-10-CM | POA: Insufficient documentation

## 2018-02-11 DIAGNOSIS — N179 Acute kidney failure, unspecified: Secondary | ICD-10-CM | POA: Diagnosis not present

## 2018-02-11 DIAGNOSIS — Z7982 Long term (current) use of aspirin: Secondary | ICD-10-CM | POA: Diagnosis not present

## 2018-02-11 DIAGNOSIS — N1330 Unspecified hydronephrosis: Secondary | ICD-10-CM | POA: Insufficient documentation

## 2018-02-11 DIAGNOSIS — J449 Chronic obstructive pulmonary disease, unspecified: Secondary | ICD-10-CM | POA: Insufficient documentation

## 2018-02-11 DIAGNOSIS — Z882 Allergy status to sulfonamides status: Secondary | ICD-10-CM | POA: Insufficient documentation

## 2018-02-11 DIAGNOSIS — C689 Malignant neoplasm of urinary organ, unspecified: Secondary | ICD-10-CM | POA: Insufficient documentation

## 2018-02-11 DIAGNOSIS — K219 Gastro-esophageal reflux disease without esophagitis: Secondary | ICD-10-CM | POA: Insufficient documentation

## 2018-02-11 DIAGNOSIS — R1900 Intra-abdominal and pelvic swelling, mass and lump, unspecified site: Secondary | ICD-10-CM

## 2018-02-11 DIAGNOSIS — Z885 Allergy status to narcotic agent status: Secondary | ICD-10-CM | POA: Diagnosis not present

## 2018-02-11 DIAGNOSIS — Z90710 Acquired absence of both cervix and uterus: Secondary | ICD-10-CM | POA: Diagnosis not present

## 2018-02-11 DIAGNOSIS — Z79899 Other long term (current) drug therapy: Secondary | ICD-10-CM | POA: Insufficient documentation

## 2018-02-11 DIAGNOSIS — Z7951 Long term (current) use of inhaled steroids: Secondary | ICD-10-CM | POA: Insufficient documentation

## 2018-02-11 HISTORY — PX: PORTA CATH INSERTION: CATH118285

## 2018-02-11 LAB — SURGICAL PATHOLOGY

## 2018-02-11 SURGERY — PORTA CATH INSERTION
Anesthesia: Moderate Sedation

## 2018-02-11 MED ORDER — CEFAZOLIN SODIUM-DEXTROSE 2-4 GM/100ML-% IV SOLN
INTRAVENOUS | Status: AC
  Filled 2018-02-11: qty 100

## 2018-02-11 MED ORDER — SODIUM CHLORIDE 0.9 % IV SOLN
Freq: Once | INTRAVENOUS | Status: AC
  Administered 2018-02-11: 09:00:00
  Filled 2018-02-11: qty 80

## 2018-02-11 MED ORDER — ONDANSETRON HCL 4 MG/2ML IJ SOLN: 4.0000 mg | Freq: Four times a day (QID) | INTRAMUSCULAR | Status: DC | PRN

## 2018-02-11 MED ORDER — DEXTROSE 5 % IV SOLN
2.0000 g | Freq: Once | INTRAVENOUS | Status: AC
  Administered 2018-02-11: 2 g via INTRAVENOUS
  Filled 2018-02-11: qty 20

## 2018-02-11 MED ORDER — MIDAZOLAM HCL 5 MG/5ML IJ SOLN
INTRAMUSCULAR | Status: AC
  Filled 2018-02-11: qty 5

## 2018-02-11 MED ORDER — HEPARIN (PORCINE) IN NACL 1000-0.9 UT/500ML-% IV SOLN
INTRAVENOUS | Status: AC
  Filled 2018-02-11: qty 500

## 2018-02-11 MED ORDER — SODIUM CHLORIDE 0.9 % IV SOLN
INTRAVENOUS | Status: DC
  Administered 2018-02-11: 07:00:00 via INTRAVENOUS

## 2018-02-11 MED ORDER — SODIUM CHLORIDE FLUSH 0.9 % IV SOLN
INTRAVENOUS | Status: AC
  Filled 2018-02-11: qty 50

## 2018-02-11 MED ORDER — FENTANYL CITRATE (PF) 100 MCG/2ML IJ SOLN
INTRAMUSCULAR | Status: AC
  Filled 2018-02-11: qty 2

## 2018-02-11 MED ORDER — FENTANYL CITRATE (PF) 100 MCG/2ML IJ SOLN
INTRAMUSCULAR | Status: DC | PRN
  Administered 2018-02-11: 25 ug via INTRAVENOUS

## 2018-02-11 MED ORDER — HYDROMORPHONE HCL 1 MG/ML IJ SOLN: 1.0000 mg | Freq: Once | INTRAMUSCULAR | Status: DC | PRN

## 2018-02-11 MED ORDER — LIDOCAINE-EPINEPHRINE (PF) 1 %-1:200000 IJ SOLN
INTRAMUSCULAR | Status: AC
  Filled 2018-02-11: qty 10

## 2018-02-11 MED ORDER — MIDAZOLAM HCL 2 MG/2ML IJ SOLN
INTRAMUSCULAR | Status: DC | PRN
  Administered 2018-02-11: 1 mg via INTRAVENOUS

## 2018-02-11 SURGICAL SUPPLY — 9 items
DERMABOND ADVANCED (GAUZE/BANDAGES/DRESSINGS) ×2
DERMABOND ADVANCED .7 DNX12 (GAUZE/BANDAGES/DRESSINGS) ×1 IMPLANT
KIT PORT POWER 8FR ISP CVUE (Port) ×3 IMPLANT
PACK ANGIOGRAPHY (CUSTOM PROCEDURE TRAY) ×3 IMPLANT
PAD GROUND ADULT SPLIT (MISCELLANEOUS) ×3 IMPLANT
PENCIL ELECTRO HAND CTR (MISCELLANEOUS) ×3 IMPLANT
SUT MNCRL AB 4-0 PS2 18 (SUTURE) ×3 IMPLANT
SUT PROLENE 0 CT 1 30 (SUTURE) ×3 IMPLANT
SUT VICRYL+ 3-0 36IN CT-1 (SUTURE) ×3 IMPLANT

## 2018-02-11 NOTE — Telephone Encounter (Signed)
-----   Message from Hollice Espy, MD sent at 02/06/2018  8:04 PM EST ----- Regarding: f/u plan I would like this patient to see me in 1 week  to discuss options.  Overbook as needed at end of AM or PM please.    Hollice Espy, MD

## 2018-02-11 NOTE — Progress Notes (Signed)
Dr. Lucky Cowboy at bedside speaking with pt. And family re: port insertion. Both verbalize understanding.

## 2018-02-11 NOTE — Telephone Encounter (Signed)
App made   michelle

## 2018-02-11 NOTE — Patient Instructions (Signed)
Atezolizumab injection What is this medicine? ATEZOLIZUMAB (a te zoe LIZ ue mab) is a monoclonal antibody. It is used to treat bladder cancer (urothelial cancer) and non-small cell lung cancer. This medicine may be used for other purposes; ask your health care provider or pharmacist if you have questions. COMMON BRAND NAME(S): Tecentriq What should I tell my health care provider before I take this medicine? They need to know if you have any of these conditions: -diabetes -immune system problems -infection -inflammatory bowel disease -liver disease -lung or breathing disease -lupus -nervous system problems like myasthenia gravis or Guillain-Barre syndrome -organ transplant -an unusual or allergic reaction to atezolizumab, other medicines, foods, dyes, or preservatives -pregnant or trying to get pregnant -breast-feeding How should I use this medicine? This medicine is for infusion into a vein. It is given by a health care professional in a hospital or clinic setting. A special MedGuide will be given to you before each treatment. Be sure to read this information carefully each time. Talk to your pediatrician regarding the use of this medicine in children. Special care may be needed. Overdosage: If you think you have taken too much of this medicine contact a poison control center or emergency room at once. NOTE: This medicine is only for you. Do not share this medicine with others. What if I miss a dose? It is important not to miss your dose. Call your doctor or health care professional if you are unable to keep an appointment. What may interact with this medicine? Interactions have not been studied. This list may not describe all possible interactions. Give your health care provider a list of all the medicines, herbs, non-prescription drugs, or dietary supplements you use. Also tell them if you smoke, drink alcohol, or use illegal drugs. Some items may interact with your medicine. What  should I watch for while using this medicine? Your condition will be monitored carefully while you are receiving this medicine. You may need blood work done while you are taking this medicine. Do not become pregnant while taking this medicine or for at least 5 months after stopping it. Women should inform their doctor if they wish to become pregnant or think they might be pregnant. There is a potential for serious side effects to an unborn child. Talk to your health care professional or pharmacist for more information. Do not breast-feed an infant while taking this medicine or for at least 5 months after the last dose. What side effects may I notice from receiving this medicine? Side effects that you should report to your doctor or health care professional as soon as possible: -allergic reactions like skin rash, itching or hives, swelling of the face, lips, or tongue -black, tarry stools -bloody or watery diarrhea -breathing problems -changes in vision -chest pain or chest tightness -chills -facial flushing -fever -headache -signs and symptoms of high blood sugar such as dizziness; dry mouth; dry skin; fruity breath; nausea; stomach pain; increased hunger or thirst; increased urination -signs and symptoms of liver injury like dark yellow or brown urine; general ill feeling or flu-like symptoms; light-colored stools; loss of appetite; nausea; right upper belly pain; unusually weak or tired; yellowing of the eyes or skin -stomach pain -trouble passing urine or change in the amount of urine Side effects that usually do not require medical attention (report to your doctor or health care professional if they continue or are bothersome): -cough -diarrhea -joint pain -muscle pain -muscle weakness -tiredness -weight loss This list may not describe all   possible side effects. Call your doctor for medical advice about side effects. You may report side effects to FDA at 1-800-FDA-1088. Where should  I keep my medicine? This drug is given in a hospital or clinic and will not be stored at home. NOTE: This sheet is a summary. It may not cover all possible information. If you have questions about this medicine, talk to your doctor, pharmacist, or health care provider.  2018 Elsevier/Gold Standard (2015-03-31 17:54:14)  

## 2018-02-11 NOTE — H&P (Signed)
[facility] History & Physical Update  The patient was interviewed and re-examined.  The patient's previous History and Physical has been reviewed and is unchanged.  There is no change in the plan of care. We plan to proceed with the scheduled procedure.  Leotis Pain, MD  02/11/2018, 8:11 AM

## 2018-02-11 NOTE — Op Note (Signed)
      [facility]       Operative Note  Date: 02/11/2018  Preoperative diagnosis:  1. Metastatic urothelial cancer  Postoperative diagnosis:  Same as above  Procedures: #1. Ultrasound guidance for vascular access to the right internal jugular vein. #2. Fluoroscopic guidance for placement of catheter. #3. Placement of CT compatible Port-A-Cath, right internal jugular vein.  Surgeon: Leotis Pain, MD.   Anesthesia: Local with moderate conscious sedation for approximately 20  minutes using 1 mg of Versed and 25 mcg of Fentanyl  Fluoroscopy time: less than 1 minute  Contrast used: 0  Estimated blood loss: 3 cc  Indication for the procedure:  The patient is a 84 y.o.female with metastatic urothelial cancer.  The patient needs a Port-A-Cath for durable venous access, chemotherapy, lab draws, and CT scans. We are asked to place this. Risks and benefits were discussed and informed consent was obtained.  Description of procedure: The patient was brought to the vascular and interventional radiology suite.  Moderate conscious sedation was administered throughout the procedure during a face to face encounter with the patient with my supervision of the RN administering medicines and monitoring the patient's vital signs, pulse oximetry, telemetry and mental status throughout from the start of the procedure until the patient was taken to the recovery room. The right neck chest and shoulder were sterilely prepped and draped, and a sterile surgical field was created. Ultrasound was used to help visualize a patent right internal jugular vein. This was then accessed under direct ultrasound guidance without difficulty with the Seldinger needle and a permanent image was recorded. A J-wire was placed. After skin nick and dilatation, the peel-away sheath was then placed over the wire. I then anesthetized an area under the clavicle approximately 1-2 fingerbreadths. A transverse incision was  created and an inferior pocket was created with electrocautery and blunt dissection. The port was then brought onto the field, placed into the pocket and secured to the chest wall with 2 Prolene sutures. The catheter was connected to the port and tunneled from the subclavicular incision to the access site. Fluoroscopic guidance was then used to cut the catheter to an appropriate length. The catheter was then placed through the peel-away sheath and the peel-away sheath was removed. The catheter tip was parked in excellent location under fluorocoscopic guidance in the SVC just above the right atrium. The pocket was then irrigated with antibiotic impregnated saline and the wound was closed with a running 3-0 Vicryl and a 4-0 Monocryl. The access incision was closed with a single 4-0 Monocryl. The Huber needle was used to withdraw blood and flush the port with heparinized saline. Dermabond was then placed as a dressing. The patient tolerated the procedure well and was taken to the recovery room in stable condition.   Leotis Pain 02/11/2018 8:50 AM   This note was created with Dragon Medical transcription system. Any errors in dictation are purely unintentional.

## 2018-02-12 ENCOUNTER — Other Ambulatory Visit: Payer: Self-pay | Admitting: Oncology

## 2018-02-12 ENCOUNTER — Inpatient Hospital Stay: Payer: Medicare Other | Attending: Oncology

## 2018-02-12 DIAGNOSIS — Z5112 Encounter for antineoplastic immunotherapy: Secondary | ICD-10-CM | POA: Insufficient documentation

## 2018-02-12 DIAGNOSIS — C689 Malignant neoplasm of urinary organ, unspecified: Secondary | ICD-10-CM

## 2018-02-12 DIAGNOSIS — R110 Nausea: Secondary | ICD-10-CM | POA: Insufficient documentation

## 2018-02-12 DIAGNOSIS — R630 Anorexia: Secondary | ICD-10-CM | POA: Insufficient documentation

## 2018-02-12 DIAGNOSIS — Z7982 Long term (current) use of aspirin: Secondary | ICD-10-CM | POA: Insufficient documentation

## 2018-02-12 DIAGNOSIS — R599 Enlarged lymph nodes, unspecified: Secondary | ICD-10-CM | POA: Insufficient documentation

## 2018-02-12 DIAGNOSIS — C801 Malignant (primary) neoplasm, unspecified: Secondary | ICD-10-CM | POA: Insufficient documentation

## 2018-02-12 DIAGNOSIS — E279 Disorder of adrenal gland, unspecified: Secondary | ICD-10-CM | POA: Insufficient documentation

## 2018-02-12 DIAGNOSIS — R531 Weakness: Secondary | ICD-10-CM | POA: Insufficient documentation

## 2018-02-12 DIAGNOSIS — N281 Cyst of kidney, acquired: Secondary | ICD-10-CM | POA: Insufficient documentation

## 2018-02-12 DIAGNOSIS — Z515 Encounter for palliative care: Secondary | ICD-10-CM | POA: Insufficient documentation

## 2018-02-12 DIAGNOSIS — D649 Anemia, unspecified: Secondary | ICD-10-CM | POA: Insufficient documentation

## 2018-02-12 DIAGNOSIS — K449 Diaphragmatic hernia without obstruction or gangrene: Secondary | ICD-10-CM | POA: Insufficient documentation

## 2018-02-12 DIAGNOSIS — N1330 Unspecified hydronephrosis: Secondary | ICD-10-CM | POA: Insufficient documentation

## 2018-02-12 DIAGNOSIS — J449 Chronic obstructive pulmonary disease, unspecified: Secondary | ICD-10-CM | POA: Insufficient documentation

## 2018-02-12 DIAGNOSIS — Z79899 Other long term (current) drug therapy: Secondary | ICD-10-CM | POA: Insufficient documentation

## 2018-02-12 DIAGNOSIS — K219 Gastro-esophageal reflux disease without esophagitis: Secondary | ICD-10-CM | POA: Insufficient documentation

## 2018-02-12 MED ORDER — PROCHLORPERAZINE MALEATE 10 MG PO TABS: 10.0000 mg | ORAL_TABLET | Freq: Four times a day (QID) | ORAL | 2 refills | Status: DC | PRN

## 2018-02-12 MED ORDER — ONDANSETRON HCL 8 MG PO TABS: 8.0000 mg | ORAL_TABLET | Freq: Two times a day (BID) | ORAL | 2 refills | Status: DC | PRN

## 2018-02-12 MED ORDER — LIDOCAINE-PRILOCAINE 2.5-2.5 % EX CREA: TOPICAL_CREAM | CUTANEOUS | 3 refills | Status: DC

## 2018-02-12 NOTE — Progress Notes (Signed)
START OFF PATHWAY REGIMEN - Bladder   [id number]:Atezolizumab 1,200 mg q21 Days:   A cycle is every 21 days:     Atezolizumab   **Always confirm dose/schedule in your pharmacy ordering system**  Patient Characteristics: Metastatic Disease, First Line, No Prior Neoadjuvant/Adjuvant Therapy, Good Renal Function (CrCl ? 50 mL/min) AJCC M Category: M1a AJCC N Category: NX AJCC T Category: TX Current evidence of distant metastases<= Yes AJCC 8 Stage Grouping: IVA Line of Therapy: First Line Prior Neoadjuvant/Adjuvant Therapy<= No Renal Function: Good Renal Function (CrCl ? 50 mL/min) Intent of Therapy: Non-Curative / Palliative Intent, Discussed with Patient

## 2018-02-13 ENCOUNTER — Inpatient Hospital Stay: Payer: Medicare Other | Admitting: Oncology

## 2018-02-13 ENCOUNTER — Inpatient Hospital Stay: Payer: Medicare Other

## 2018-02-13 ENCOUNTER — Inpatient Hospital Stay: Payer: Medicare Other | Admitting: Hospice and Palliative Medicine

## 2018-02-14 ENCOUNTER — Encounter: Payer: Self-pay | Admitting: Oncology

## 2018-02-14 ENCOUNTER — Other Ambulatory Visit: Payer: Medicare Other

## 2018-02-14 ENCOUNTER — Inpatient Hospital Stay (HOSPITAL_BASED_OUTPATIENT_CLINIC_OR_DEPARTMENT_OTHER): Payer: Medicare Other | Admitting: Oncology

## 2018-02-14 ENCOUNTER — Other Ambulatory Visit: Payer: Self-pay

## 2018-02-14 ENCOUNTER — Ambulatory Visit: Payer: Medicare Other | Admitting: Oncology

## 2018-02-14 ENCOUNTER — Inpatient Hospital Stay (HOSPITAL_BASED_OUTPATIENT_CLINIC_OR_DEPARTMENT_OTHER): Payer: Medicare Other | Admitting: Hospice and Palliative Medicine

## 2018-02-14 ENCOUNTER — Inpatient Hospital Stay: Payer: Medicare Other

## 2018-02-14 VITALS — BP 118/77 | HR 91 | Temp 97.5°F | Resp 18 | Wt 138.5 lb

## 2018-02-14 DIAGNOSIS — J449 Chronic obstructive pulmonary disease, unspecified: Secondary | ICD-10-CM | POA: Diagnosis not present

## 2018-02-14 DIAGNOSIS — R110 Nausea: Secondary | ICD-10-CM | POA: Diagnosis not present

## 2018-02-14 DIAGNOSIS — K449 Diaphragmatic hernia without obstruction or gangrene: Secondary | ICD-10-CM | POA: Diagnosis not present

## 2018-02-14 DIAGNOSIS — R630 Anorexia: Secondary | ICD-10-CM | POA: Diagnosis not present

## 2018-02-14 DIAGNOSIS — Z515 Encounter for palliative care: Secondary | ICD-10-CM

## 2018-02-14 DIAGNOSIS — R531 Weakness: Secondary | ICD-10-CM | POA: Diagnosis not present

## 2018-02-14 DIAGNOSIS — K219 Gastro-esophageal reflux disease without esophagitis: Secondary | ICD-10-CM

## 2018-02-14 DIAGNOSIS — Z7982 Long term (current) use of aspirin: Secondary | ICD-10-CM | POA: Diagnosis not present

## 2018-02-14 DIAGNOSIS — Z5112 Encounter for antineoplastic immunotherapy: Secondary | ICD-10-CM | POA: Diagnosis present

## 2018-02-14 DIAGNOSIS — Z79899 Other long term (current) drug therapy: Secondary | ICD-10-CM | POA: Diagnosis not present

## 2018-02-14 DIAGNOSIS — C679 Malignant neoplasm of bladder, unspecified: Secondary | ICD-10-CM

## 2018-02-14 DIAGNOSIS — E279 Disorder of adrenal gland, unspecified: Secondary | ICD-10-CM

## 2018-02-14 DIAGNOSIS — D649 Anemia, unspecified: Secondary | ICD-10-CM | POA: Diagnosis not present

## 2018-02-14 DIAGNOSIS — C801 Malignant (primary) neoplasm, unspecified: Secondary | ICD-10-CM | POA: Diagnosis not present

## 2018-02-14 DIAGNOSIS — N281 Cyst of kidney, acquired: Secondary | ICD-10-CM

## 2018-02-14 DIAGNOSIS — N1330 Unspecified hydronephrosis: Secondary | ICD-10-CM

## 2018-02-14 DIAGNOSIS — R599 Enlarged lymph nodes, unspecified: Secondary | ICD-10-CM

## 2018-02-14 DIAGNOSIS — C689 Malignant neoplasm of urinary organ, unspecified: Secondary | ICD-10-CM

## 2018-02-14 LAB — CBC WITH DIFFERENTIAL/PLATELET
Abs Immature Granulocytes: 0.04 10*3/uL (ref 0.00–0.07)
BASOS ABS: 0.1 10*3/uL (ref 0.0–0.1)
Basophils Relative: 1 %
Eosinophils Absolute: 1.4 10*3/uL — ABNORMAL HIGH (ref 0.0–0.5)
Eosinophils Relative: 12 %
HCT: 32.7 % — ABNORMAL LOW (ref 36.0–46.0)
Hemoglobin: 10.2 g/dL — ABNORMAL LOW (ref 12.0–15.0)
Immature Granulocytes: 0 %
Lymphocytes Relative: 13 %
Lymphs Abs: 1.4 10*3/uL (ref 0.7–4.0)
MCH: 28.7 pg (ref 26.0–34.0)
MCHC: 31.2 g/dL (ref 30.0–36.0)
MCV: 92.1 fL (ref 80.0–100.0)
Monocytes Absolute: 0.8 10*3/uL (ref 0.1–1.0)
Monocytes Relative: 7 %
NRBC: 0 % (ref 0.0–0.2)
Neutro Abs: 7.5 10*3/uL (ref 1.7–7.7)
Neutrophils Relative %: 67 %
PLATELETS: 216 10*3/uL (ref 150–400)
RBC: 3.55 MIL/uL — ABNORMAL LOW (ref 3.87–5.11)
RDW: 13 % (ref 11.5–15.5)
WBC: 11.1 10*3/uL — AB (ref 4.0–10.5)

## 2018-02-14 LAB — COMPREHENSIVE METABOLIC PANEL
ALK PHOS: 104 U/L (ref 38–126)
ALT: 12 U/L (ref 0–44)
AST: 23 U/L (ref 15–41)
Albumin: 3.4 g/dL — ABNORMAL LOW (ref 3.5–5.0)
Anion gap: 9 (ref 5–15)
BILIRUBIN TOTAL: 0.5 mg/dL (ref 0.3–1.2)
BUN: 26 mg/dL — ABNORMAL HIGH (ref 8–23)
CO2: 25 mmol/L (ref 22–32)
Calcium: 8.6 mg/dL — ABNORMAL LOW (ref 8.9–10.3)
Chloride: 106 mmol/L (ref 98–111)
Creatinine, Ser: 1.65 mg/dL — ABNORMAL HIGH (ref 0.44–1.00)
GFR calc Af Amer: 31 mL/min — ABNORMAL LOW (ref >60)
GFR calc non Af Amer: 27 mL/min — ABNORMAL LOW (ref >60)
Glucose, Bld: 141 mg/dL — ABNORMAL HIGH (ref 70–99)
Potassium: 4.1 mmol/L (ref 3.5–5.1)
Sodium: 140 mmol/L (ref 135–145)
Total Protein: 6.7 g/dL (ref 6.5–8.1)

## 2018-02-14 MED ORDER — SODIUM CHLORIDE 0.9 % IV SOLN
1200.0000 mg | Freq: Once | INTRAVENOUS | Status: AC
  Administered 2018-02-14: 1200 mg via INTRAVENOUS
  Filled 2018-02-14: qty 20

## 2018-02-14 MED ORDER — SODIUM CHLORIDE 0.9% FLUSH
10.0000 mL | Freq: Once | INTRAVENOUS | Status: AC
  Administered 2018-02-14: 10 mL via INTRAVENOUS
  Filled 2018-02-14: qty 10

## 2018-02-14 MED ORDER — SODIUM CHLORIDE 0.9 % IV SOLN
Freq: Once | INTRAVENOUS | Status: AC
  Administered 2018-02-14: 11:00:00 via INTRAVENOUS
  Filled 2018-02-14: qty 250

## 2018-02-14 MED ORDER — HEPARIN SOD (PORK) LOCK FLUSH 100 UNIT/ML IV SOLN
500.0000 [IU] | Freq: Once | INTRAVENOUS | Status: AC
  Administered 2018-02-14: 500 [IU] via INTRAVENOUS

## 2018-02-14 NOTE — Progress Notes (Signed)
Patient here today for follow up and treatment consideration regarding bladder cancer. Patient denies concerns today.

## 2018-02-14 NOTE — Progress Notes (Signed)
[facility]  Telephone:([phone] [phone]   Name: Kristi Thompson Date: 02/14/2018 MRN: [id number]  DOB: Feb 06, 1924  Patient Care Team: [name], MD as PCP - General (Internal Medicine) Clent Jacks, RN as Registered Nurse    REASON FOR CONSULTATION: Palliative Care consult requested for this 84 y.o. female with multiple medical problems including asthma, COPD, and GERD, who was recently found to have a pelvic mass with right hydronephrosis and retroperitoneal lymphadenopathy.  Biopsy of the right periadrenal lymph node was consistent with carcinoma of unknown primary but favored urothelial origin.  Patient is being started on atezolizumab.  She was referred to palliative care to help address goals and support her through the treatment process.  SOCIAL HISTORY:    Patient is widowed.  She lives at [facility] in a senior apartment.  She has a daughter who lives in [place] and a son and daughter who lives in [place].  She is originally from [place].  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  ADVANCE DIRECTIVES:  Patient has healthcare power of attorney and living will on file.  Her living will specifies a desire for natural death.  CODE STATUS: to be addressed  PAST MEDICAL HISTORY: Past Medical History:  Diagnosis Date  . Asthma   . Cancer ([place]) 1978   colon ca  . COPD (chronic obstructive pulmonary disease) ([place])   . GERD (gastroesophageal reflux disease)   . History of hiatal hernia     PAST SURGICAL HISTORY:  Past Surgical History:  Procedure Laterality Date  . ABDOMINAL HYSTERECTOMY    . APPENDECTOMY    . bowel obstruction    . CATARACT EXTRACTION W/ INTRAOCULAR LENS  IMPLANT, BILATERAL    . CHOLECYSTECTOMY    . COLON SURGERY     colon cancer  . COLONOSCOPY WITH PROPOFOL N/A 01/18/2018   Procedure: COLONOSCOPY WITH PROPOFOL;  Surgeon: Jonathon Bellows, MD;  Location: [facility] ENDOSCOPY;  Service:  Gastroenterology;  Laterality: N/A;  . CYSTOSCOPY W/ URETERAL STENT PLACEMENT Right 02/06/2018   Procedure: CYSTOSCOPY WITH RETROGRADE PYELOGRAM;  Surgeon: Hollice Espy, MD;  Location: [facility] ORS;  Service: Urology;  Laterality: Right;  . CYSTOSCOPY WITH URETEROSCOPY Right 02/06/2018   Procedure: CYSTOSCOPY WITH URETEROSCOPY;  Surgeon: Hollice Espy, MD;  Location: [facility] ORS;  Service: Urology;  Laterality: Right;  . PORTA CATH INSERTION N/A 02/11/2018   Procedure: PORTA CATH INSERTION;  Surgeon: Algernon Huxley, MD;  Location: [facility] LAB;  Service: Cardiovascular;  Laterality: N/A;  . THYROID SURGERY     nodule removed  . THYROIDECTOMY    . TONSILLECTOMY    . URETERAL BIOPSY Right 02/06/2018   Procedure: URETERAL BIOPSY;  Surgeon: Hollice Espy, MD;  Location: [facility] ORS;  Service: Urology;  Laterality: Right;    HEMATOLOGY/ONCOLOGY HISTORY:    Urothelial carcinoma ([place])   02/12/2018 Initial Diagnosis    Urothelial carcinoma ([place])    02/12/2018 -  Chemotherapy    The patient had atezolizumab (TECENTRIQ) 1,200 mg in sodium chloride 0.9 % 250 mL chemo infusion, 1,200 mg, Intravenous, Once, 0 of 6 cycles  for chemotherapy treatment.      ALLERGIES:  is allergic to demerol [meperidine] and sulfa antibiotics.  MEDICATIONS:  Current Outpatient Medications  Medication Sig Dispense Refill  . acetaminophen (TYLENOL) 325 MG tablet Take 325 mg by mouth every 6 (six) hours as needed.    Marland Kitchen albuterol (PROVENTIL HFA;VENTOLIN HFA) 108 (90 Base) MCG/ACT inhaler Inhale into  the lungs.    Marland Kitchen aspirin EC 81 MG tablet Take by mouth.    . budesonide (PULMICORT FLEXHALER) 180 MCG/ACT inhaler Inhale into the lungs.    . Cyanocobalamin 2500 MCG SUBL Place under the tongue.    . famotidine (PEPCID) 40 MG tablet Take by mouth.    . fluticasone (FLONASE) 50 MCG/ACT nasal spray USE 2 SPRAYS INTO BOTH     NOSTRILS NIGHTLY    . HYDROcodone-acetaminophen (NORCO/VICODIN) 5-325 MG tablet TK 1 T PO Q 6 H  PRN  0  . lidocaine-prilocaine (EMLA) cream Apply to affected area once 30 g 3  . mirabegron ER (MYRBETRIQ) 25 MG TB24 tablet Take 1 tablet (25 mg total) by mouth daily. 30 tablet 4  . montelukast (SINGULAIR) 10 MG tablet TAKE 1 TABLET DAILY    . Multiple Vitamin (MULTI-VITAMINS) TABS Take by mouth.    . ondansetron (ZOFRAN) 8 MG tablet Take 1 tablet (8 mg total) by mouth 2 (two) times daily as needed (Nausea or vomiting). (Patient not taking: Reported on 02/14/2018) 30 tablet 2  . prochlorperazine (COMPAZINE) 10 MG tablet Take 1 tablet (10 mg total) by mouth every 6 (six) hours as needed (Nausea or vomiting). (Patient not taking: Reported on 02/14/2018) 60 tablet 2  . tamsulosin (FLOMAX) 0.4 MG CAPS capsule Take by mouth.     No current facility-administered medications for this visit.    Facility-Administered Medications Ordered in Other Visits  Medication Dose Route Frequency Provider Last Rate Last Dose  . heparin lock flush 100 unit/mL  500 Units Intravenous Once Lloyd Huger, MD        VITAL SIGNS: LMP  (LMP Unknown)  There were no vitals filed for this visit.  Estimated body mass index is 26.17 kg/m as calculated from the following:   Height as of 02/11/18: '5\' 1"'  (1.549 m).   Weight as of an earlier encounter on 02/14/18: 138 lb 8 oz (62.8 kg).  LABS: CBC:    Component Value Date/Time   WBC 11.1 (H) 02/14/2018 0827   HGB 10.2 (L) 02/14/2018 0827   HGB 12.9 04/30/2013 2116   HCT 32.7 (L) 02/14/2018 0827   HCT 39.1 04/30/2013 2116   PLT 216 02/14/2018 0827   PLT 229 04/30/2013 2116   MCV 92.1 02/14/2018 0827   MCV 93 04/30/2013 2116   NEUTROABS 7.5 02/14/2018 0827   LYMPHSABS 1.4 02/14/2018 0827   MONOABS 0.8 02/14/2018 0827   EOSABS 1.4 (H) 02/14/2018 0827   BASOSABS 0.1 02/14/2018 0827   Comprehensive Metabolic Panel:    Component Value Date/Time   NA 140 02/14/2018 0827   NA 140 04/30/2013 2116   K 4.1 02/14/2018 0827   K 3.8 04/30/2013 2116   CL 106  02/14/2018 0827   CL 107 04/30/2013 2116   CO2 25 02/14/2018 0827   CO2 29 04/30/2013 2116   BUN 26 (H) 02/14/2018 0827   BUN 25 (H) 04/30/2013 2116   CREATININE 1.65 (H) 02/14/2018 0827   CREATININE 1.01 04/30/2013 2116   GLUCOSE 141 (H) 02/14/2018 0827   GLUCOSE 126 (H) 04/30/2013 2116   CALCIUM 8.6 (L) 02/14/2018 0827   CALCIUM 8.9 04/30/2013 2116   AST 23 02/14/2018 0827   ALT 12 02/14/2018 0827   ALKPHOS 104 02/14/2018 0827   BILITOT 0.5 02/14/2018 0827   PROT 6.7 02/14/2018 0827   ALBUMIN 3.4 (L) 02/14/2018 0827    RADIOGRAPHIC STUDIES: Ct Abdomen Pelvis W Contrast  Result Date: 01/18/2018 CLINICAL DATA:  Generalized abdominal pain status post colonoscopy. EXAM: CT ABDOMEN AND PELVIS WITH CONTRAST TECHNIQUE: Multidetector CT imaging of the abdomen and pelvis was performed using the standard protocol following bolus administration of intravenous contrast. CONTRAST:  60m OMNIPAQUE IOHEXOL 300 MG/ML  SOLN COMPARISON:  CT scan of December 27, 2017. FINDINGS: Lower chest: No acute abnormality. Hepatobiliary: No focal liver abnormality is seen. No gallstones, gallbladder wall thickening, or biliary dilatation. Pancreas: Unremarkable. No pancreatic ductal dilatation or surrounding inflammatory changes. Spleen: Normal in size without focal abnormality. Adrenals/Urinary Tract: 2.1 cm right adrenal mass is noted concerning for metastatic disease. Left adrenal gland appears normal. Small left renal cysts are noted. Severe right hydronephrosis is noted, with ureteral obstruction secondary to malignant neoplasm or metastatic disease in the right retroperitoneal region. Urinary bladder is unremarkable. Stomach/Bowel: The stomach appears normal. There is no evidence of bowel obstruction or inflammation. Irregular soft tissue mass is seen involving the transverse colon consistent with malignancy. It measures 6.9 x 3.9 cm. The appendix is not visualized. Vascular/Lymphatic: Atherosclerosis of abdominal  aorta is noted. 2.3 cm right periaortic lymph node is noted. 12 mm right periaortic lymph node is noted more inferiorly. Reproductive: Status post hysterectomy. No adnexal masses. Other: No hernia or fluid collection is noted. 4.3 x 6.4 cm irregularly enhancing mass is noted in the right retroperitoneal region along the course of the right ureter consistent with malignancy or metastatic disease. No pneumoperitoneum is noted. Musculoskeletal: No acute or significant osseous findings. IMPRESSION: 6.9 x 3.9 cm irregular mass seen in transverse colon consistent with colonic malignancy. Right adrenal mass is noted as well as right periaortic adenopathy consistent with metastatic disease. Severe right hydronephrosis is noted secondary to obstruction from large irregular mass seen in the right retroperitoneal region in the expected location of the right ureter. This is concerning for metastatic disease or malignancy. Aortic Atherosclerosis (ICD10-I70.0). Electronically Signed   By: JMarijo Conception M.D.   On: 01/18/2018 18:28   Ct Biopsy  Result Date: 01/23/2018 INDICATION: Right Peri adrenal adenopathy EXAM: CT BIOPSY MEDICATIONS: None. ANESTHESIA/SEDATION: Fentanyl 50 mcg IV; Versed 1 mg IV Moderate Sedation Time:  8 minutes The patient was continuously monitored during the procedure by the interventional radiology nurse under my direct supervision. FLUOROSCOPY TIME:  Fluoroscopy Time:  minutes  seconds ( mGy). COMPLICATIONS: None immediate. PROCEDURE: Informed written consent was obtained from the patient after a thorough discussion of the procedural risks, benefits and alternatives. All questions were addressed. Maximal Sterile Barrier Technique was utilized including caps, mask, sterile gowns, sterile gloves, sterile drape, hand hygiene and skin antiseptic. A timeout was performed prior to the initiation of the procedure. Under CT guidance, a(n) 17 gauge guide needle was advanced into the right Peri adrenal lymph  node. Subsequently, 3 18 gauge core biopsies were obtained. The guide needle was removed. Post biopsy images demonstrate no hemorrhage. Patient tolerated the procedure well without complication. Vital sign monitoring by nursing staff during the procedure will continue as patient is in the special procedures unit for post procedure observation. FINDINGS: The images document guide needle placement within the right Peri adrenal lymph node. Post biopsy images demonstrate no hemorrhage. IMPRESSION: CT-guided core biopsy of a right Peri adrenal lymph node. Electronically Signed   By: AMarybelle KillingsM.D.   On: 01/23/2018 12:52    PERFORMANCE STATUS (ECOG) : 1 - Symptomatic but completely ambulatory  Review of Systems As noted above. Otherwise, a complete review of systems is negative.  Physical Exam General: NAD,  frail appearing, thin Cardiovascular: regular rate and rhythm Pulmonary: clear ant fields Abdomen: soft, nontender, + bowel sounds GU: no suprapubic tenderness Extremities: no edema, no joint deformities Skin: no rashes Neurological: Weakness but otherwise nonfocal  IMPRESSION: I met today with patient, daughter, son, and daughter-in-law.  They also met with Dr. Grayland Ormond today.  Patient is scheduled to begin immunotherapy today.  Patient says she is currently asymptomatic.  She has occasional burning pain to the right side of the abdomen.  She takes acetaminophen as needed with good effect.  Patient also has PRN Norco but rarely uses it.  Oral intake is reportedly intermittently poor but this is dependent upon the quality of food.  However, patient does not appear to have any appreciable weight loss over the past 2 months.  Will follow weight trends and refer her to RD if necessary.  Discussed importance of high-protein high-calorie meals.  We discussed her emotional coping with her illness.  She says that the initial diagnosis with a shock but she feels like she is coping well now.  We  discussed various resources available in the cancer center.  Patient's daughter is her healthcare power of attorney.  Patient has a living will on file.  She would benefit from a MOST Form, which I will address during a future visit.  PLAN: Treatment plan as outlined per oncology Continue supportive care Acetaminophen and Norco as needed for pain Plan to complete MOST Form during a future visit RTC in 3 weeks   Patient expressed understanding and was in agreement with this plan. She also understands that She can call clinic at any time with any questions, concerns, or complaints.    Time Total: 20 minutes  Visit consisted of counseling and education dealing with the complex and emotionally intense issues of symptom management and palliative care in the setting of serious and potentially life-threatening illness.Greater than 50%  of this time was spent counseling and coordinating care related to the above assessment and plan.  Signed by: Altha Harm, PhD, NP-C [phone] (Work Cell)

## 2018-02-15 LAB — THYROID PANEL WITH TSH
Free Thyroxine Index: 1.5 (ref 1.2–4.9)
T3 Uptake Ratio: 25 % (ref 24–39)
T4, Total: 6.1 ug/dL (ref 4.5–12.0)
TSH: 2.06 u[IU]/mL (ref 0.450–4.500)

## 2018-02-16 NOTE — Progress Notes (Signed)
[place]  Telephone:(336) [phone] Fax:(336) [phone]  ID: Kristi Thompson OB: [birth year]  MR#: [id number]  YHC#:[id number]  Patient Care Team: [name], MD as PCP - General (Internal Medicine) Clent Jacks, RN as Registered Nurse  CHIEF COMPLAINT: Urothelial carcinoma.  INTERVAL HISTORY: Patient returns to clinic today for further evaluation, discussion of her pathology results, and initiation of Tecentriq.  She continues to feel well and remains asymptomatic. She does not complain of pain today.  She has no neurologic complaints.  She denies any fevers or night sweats.  She has no chest pain, shortness of breath, cough, or hemoptysis.  She denies any nausea, vomiting, constipation, or diarrhea.  She denies any melena or hematochezia.  She has no urinary complaints.  Patient feels at her baseline offers no specific complaints today.  REVIEW OF SYSTEMS:   Review of Systems  Constitutional: Negative.  Negative for fever, malaise/fatigue and weight loss.  Respiratory: Negative.  Negative for cough, hemoptysis and shortness of breath.   Cardiovascular: Negative.  Negative for chest pain and leg swelling.  Gastrointestinal: Negative for abdominal pain, blood in stool, constipation, diarrhea, melena, nausea and vomiting.  Genitourinary: Negative.  Negative for dysuria and hematuria.  Musculoskeletal: Negative.  Negative for back pain.  Skin: Negative.  Negative for rash.  Neurological: Negative.  Negative for dizziness, focal weakness, weakness and headaches.  Psychiatric/Behavioral: Negative.  The patient is not nervous/anxious.     As per HPI. Otherwise, a complete review of systems is negative.  PAST MEDICAL HISTORY: Past Medical History:  Diagnosis Date  . Asthma   . Cancer ([place]) 1978   colon ca  . COPD (chronic obstructive pulmonary disease) ([place])   . GERD (gastroesophageal reflux disease)   . History of hiatal hernia     PAST SURGICAL  HISTORY: Past Surgical History:  Procedure Laterality Date  . ABDOMINAL HYSTERECTOMY    . APPENDECTOMY    . bowel obstruction    . CATARACT EXTRACTION W/ INTRAOCULAR LENS  IMPLANT, BILATERAL    . CHOLECYSTECTOMY    . COLON SURGERY     colon cancer  . COLONOSCOPY WITH PROPOFOL N/A 01/18/2018   Procedure: COLONOSCOPY WITH PROPOFOL;  Surgeon: Jonathon Bellows, MD;  Location: [facility] ENDOSCOPY;  Service: Gastroenterology;  Laterality: N/A;  . CYSTOSCOPY W/ URETERAL STENT PLACEMENT Right 02/06/2018   Procedure: CYSTOSCOPY WITH RETROGRADE PYELOGRAM;  Surgeon: Hollice Espy, MD;  Location: [facility] ORS;  Service: Urology;  Laterality: Right;  . CYSTOSCOPY WITH URETEROSCOPY Right 02/06/2018   Procedure: CYSTOSCOPY WITH URETEROSCOPY;  Surgeon: Hollice Espy, MD;  Location: [facility] ORS;  Service: Urology;  Laterality: Right;  . PORTA CATH INSERTION N/A 02/11/2018   Procedure: PORTA CATH INSERTION;  Surgeon: Algernon Huxley, MD;  Location: [place] CV LAB;  Service: Cardiovascular;  Laterality: N/A;  . THYROID SURGERY     nodule removed  . THYROIDECTOMY    . TONSILLECTOMY    . URETERAL BIOPSY Right 02/06/2018   Procedure: URETERAL BIOPSY;  Surgeon: Hollice Espy, MD;  Location: [facility] ORS;  Service: Urology;  Laterality: Right;    FAMILY HISTORY: Negative and noncontributory. History reviewed. No pertinent family history.  ADVANCED DIRECTIVES (Y/N):  N  HEALTH MAINTENANCE: Social History   Tobacco Use  . Smoking status: Never Smoker  . Smokeless tobacco: Never Used  Substance Use Topics  . Alcohol use: Yes    Comment: rarely  . Drug use: No     Colonoscopy:  PAP:  Bone density:  Lipid panel:  Allergies  Allergen Reactions  . Demerol [Meperidine] Swelling  . Sulfa Antibiotics Swelling    Current Outpatient Medications  Medication Sig Dispense Refill  . acetaminophen (TYLENOL) 325 MG tablet Take 325 mg by mouth every 6 (six) hours as needed.    Marland Kitchen albuterol (PROVENTIL HFA;VENTOLIN  HFA) 108 (90 Base) MCG/ACT inhaler Inhale into the lungs.    Marland Kitchen aspirin EC 81 MG tablet Take by mouth.    . budesonide (PULMICORT FLEXHALER) 180 MCG/ACT inhaler Inhale into the lungs.    . Cyanocobalamin 2500 MCG SUBL Place under the tongue.    . famotidine (PEPCID) 40 MG tablet Take by mouth.    . fluticasone (FLONASE) 50 MCG/ACT nasal spray USE 2 SPRAYS INTO BOTH     NOSTRILS NIGHTLY    . HYDROcodone-acetaminophen (NORCO/VICODIN) 5-325 MG tablet TK 1 T PO Q 6 H PRN  0  . lidocaine-prilocaine (EMLA) cream Apply to affected area once 30 g 3  . mirabegron ER (MYRBETRIQ) 25 MG TB24 tablet Take 1 tablet (25 mg total) by mouth daily. 30 tablet 4  . montelukast (SINGULAIR) 10 MG tablet TAKE 1 TABLET DAILY    . Multiple Vitamin (MULTI-VITAMINS) TABS Take by mouth.    . tamsulosin (FLOMAX) 0.4 MG CAPS capsule Take by mouth.    . ondansetron (ZOFRAN) 8 MG tablet Take 1 tablet (8 mg total) by mouth 2 (two) times daily as needed (Nausea or vomiting). (Patient not taking: Reported on 02/14/2018) 30 tablet 2  . prochlorperazine (COMPAZINE) 10 MG tablet Take 1 tablet (10 mg total) by mouth every 6 (six) hours as needed (Nausea or vomiting). (Patient not taking: Reported on 02/14/2018) 60 tablet 2   No current facility-administered medications for this visit.     OBJECTIVE: Vitals:   02/14/18 0847  BP: 118/77  Pulse: 91  Resp: 18  Temp: (!) 97.5 F (36.4 C)     Body mass index is 26.17 kg/m.    ECOG FS:0 - Asymptomatic  General: Well-developed, well-nourished, no acute distress. Eyes: Pink conjunctiva, anicteric sclera. HEENT: Normocephalic, moist mucous membranes. Lungs: Clear to auscultation bilaterally. Heart: Regular rate and rhythm. No rubs, murmurs, or gallops. Abdomen: Soft, nontender, nondistended. No organomegaly noted, normoactive bowel sounds. Musculoskeletal: No edema, cyanosis, or clubbing. Neuro: Alert, answering all questions appropriately. Cranial nerves grossly intact. Skin: No  rashes or petechiae noted. Psych: Normal affect.  LAB RESULTS:  Lab Results  Component Value Date   NA 140 02/14/2018   K 4.1 02/14/2018   CL 106 02/14/2018   CO2 25 02/14/2018   GLUCOSE 141 (H) 02/14/2018   BUN 26 (H) 02/14/2018   CREATININE 1.65 (H) 02/14/2018   CALCIUM 8.6 (L) 02/14/2018   PROT 6.7 02/14/2018   ALBUMIN 3.4 (L) 02/14/2018   AST 23 02/14/2018   ALT 12 02/14/2018   ALKPHOS 104 02/14/2018   BILITOT 0.5 02/14/2018   GFRNONAA 27 (L) 02/14/2018   GFRAA 31 (L) 02/14/2018    Lab Results  Component Value Date   WBC 11.1 (H) 02/14/2018   NEUTROABS 7.5 02/14/2018   HGB 10.2 (L) 02/14/2018   HCT 32.7 (L) 02/14/2018   MCV 92.1 02/14/2018   PLT 216 02/14/2018     STUDIES: Ct Abdomen Pelvis W Contrast  Result Date: 01/18/2018 CLINICAL DATA:  Generalized abdominal pain status post colonoscopy. EXAM: CT ABDOMEN AND PELVIS WITH CONTRAST TECHNIQUE: Multidetector CT imaging of the abdomen and pelvis was performed using the standard protocol following bolus administration of intravenous  contrast. CONTRAST:  41mL OMNIPAQUE IOHEXOL 300 MG/ML  SOLN COMPARISON:  CT scan of December 27, 2017. FINDINGS: Lower chest: No acute abnormality. Hepatobiliary: No focal liver abnormality is seen. No gallstones, gallbladder wall thickening, or biliary dilatation. Pancreas: Unremarkable. No pancreatic ductal dilatation or surrounding inflammatory changes. Spleen: Normal in size without focal abnormality. Adrenals/Urinary Tract: 2.1 cm right adrenal mass is noted concerning for metastatic disease. Left adrenal gland appears normal. Small left renal cysts are noted. Severe right hydronephrosis is noted, with ureteral obstruction secondary to malignant neoplasm or metastatic disease in the right retroperitoneal region. Urinary bladder is unremarkable. Stomach/Bowel: The stomach appears normal. There is no evidence of bowel obstruction or inflammation. Irregular soft tissue mass is seen involving the  transverse colon consistent with malignancy. It measures 6.9 x 3.9 cm. The appendix is not visualized. Vascular/Lymphatic: Atherosclerosis of abdominal aorta is noted. 2.3 cm right periaortic lymph node is noted. 12 mm right periaortic lymph node is noted more inferiorly. Reproductive: Status post hysterectomy. No adnexal masses. Other: No hernia or fluid collection is noted. 4.3 x 6.4 cm irregularly enhancing mass is noted in the right retroperitoneal region along the course of the right ureter consistent with malignancy or metastatic disease. No pneumoperitoneum is noted. Musculoskeletal: No acute or significant osseous findings. IMPRESSION: 6.9 x 3.9 cm irregular mass seen in transverse colon consistent with colonic malignancy. Right adrenal mass is noted as well as right periaortic adenopathy consistent with metastatic disease. Severe right hydronephrosis is noted secondary to obstruction from large irregular mass seen in the right retroperitoneal region in the expected location of the right ureter. This is concerning for metastatic disease or malignancy. Aortic Atherosclerosis (ICD10-I70.0). Electronically Signed   By: Marijo Conception, M.D.   On: 01/18/2018 18:28   Ct Biopsy  Result Date: 01/23/2018 INDICATION: Right Peri adrenal adenopathy EXAM: CT BIOPSY MEDICATIONS: None. ANESTHESIA/SEDATION: Fentanyl 50 mcg IV; Versed 1 mg IV Moderate Sedation Time:  8 minutes The patient was continuously monitored during the procedure by the interventional radiology nurse under my direct supervision. FLUOROSCOPY TIME:  Fluoroscopy Time:  minutes  seconds ( mGy). COMPLICATIONS: None immediate. PROCEDURE: Informed written consent was obtained from the patient after a thorough discussion of the procedural risks, benefits and alternatives. All questions were addressed. Maximal Sterile Barrier Technique was utilized including caps, mask, sterile gowns, sterile gloves, sterile drape, hand hygiene and skin antiseptic. A  timeout was performed prior to the initiation of the procedure. Under CT guidance, a(n) 17 gauge guide needle was advanced into the right Peri adrenal lymph node. Subsequently, 3 18 gauge core biopsies were obtained. The guide needle was removed. Post biopsy images demonstrate no hemorrhage. Patient tolerated the procedure well without complication. Vital sign monitoring by nursing staff during the procedure will continue as patient is in the special procedures unit for post procedure observation. FINDINGS: The images document guide needle placement within the right Peri adrenal lymph node. Post biopsy images demonstrate no hemorrhage. IMPRESSION: CT-guided core biopsy of a right Peri adrenal lymph node. Electronically Signed   By: Marybelle Killings M.D.   On: 01/23/2018 12:52    ASSESSMENT: Urothelial carcinoma.  PLAN:    1.  Urothelial carcinoma: Case discussed with urology.  Recent cystoscopy on February 06, 2018 revealed a right ureteral mass.  Cytology report consistent with high-grade urothelial carcinoma consistent with previous biopsy of abdominal mass.  Given patient's advanced age and elevated creatinine, hesitant to use cisplatin therefore we will proceed with Tecentriq every 3  weeks until intolerable side effects or progression of disease.  Proceed with cycle 1 today.  Return to clinic in 3 weeks for further evaluation and consideration of cycle 2. 2.  Hydronephrosis: Stent placement was not possible.  Nephrostomy tube was briefly discussed, but patient declined this at this time. 3.  Renal insufficiency: Likely secondary to obstruction from tumor.  No nephrostomy tubes at this time.  No cisplatin as above.  Continue to monitor closely. 4.  Anemia: Patient's hemoglobin is decreased at 10.2.  Monitor.    Patient expressed understanding and was in agreement with this plan. She also understands that She can call clinic at any time with any questions, concerns, or complaints.   Cancer Staging No  matching staging information was found for the patient.  Lloyd Huger, MD   02/16/2018 7:20 AM

## 2018-02-19 ENCOUNTER — Encounter: Payer: Self-pay | Admitting: Urology

## 2018-02-19 ENCOUNTER — Other Ambulatory Visit: Payer: Self-pay

## 2018-02-19 ENCOUNTER — Ambulatory Visit (INDEPENDENT_AMBULATORY_CARE_PROVIDER_SITE_OTHER): Payer: Medicare Other | Admitting: Urology

## 2018-02-19 VITALS — BP 112/57 | HR 93 | Ht 61.0 in | Wt 140.0 lb

## 2018-02-19 DIAGNOSIS — C689 Malignant neoplasm of urinary organ, unspecified: Secondary | ICD-10-CM

## 2018-02-19 DIAGNOSIS — N1330 Unspecified hydronephrosis: Secondary | ICD-10-CM | POA: Diagnosis not present

## 2018-02-19 DIAGNOSIS — N179 Acute kidney failure, unspecified: Secondary | ICD-10-CM | POA: Diagnosis not present

## 2018-02-26 ENCOUNTER — Encounter: Payer: Self-pay | Admitting: Hematology and Oncology

## 2018-02-26 ENCOUNTER — Encounter: Payer: Self-pay | Admitting: Oncology

## 2018-03-03 NOTE — Progress Notes (Signed)
[place]  Telephone:(336) [phone] Fax:(336) [phone]  ID: Kristi Thompson OB: September 24, 1923  MR#: [id number]  [facility]#:[id number]  Patient Care Team: [name], MD as PCP - General (Internal Medicine) Clent Jacks, RN as Registered Nurse  CHIEF COMPLAINT: Urothelial carcinoma.  INTERVAL HISTORY: Patient returns to clinic today for further evaluation and consideration of cycle 2 of Tecentriq.  She tolerated her first treatment well without significant side effects.  She notices an intermittent "burning" sensation in her back and legs, but otherwise feels well.  She has no neurologic complaints.  She denies any fevers or night sweats.  She has no chest pain, shortness of breath, cough, or hemoptysis.  She denies any nausea, vomiting, constipation, or diarrhea.  She denies any melena or hematochezia.  She has no urinary complaints.  Patient offers no further specific complaints today.  REVIEW OF SYSTEMS:   Review of Systems  Constitutional: Negative.  Negative for fever, malaise/fatigue and weight loss.  Respiratory: Negative.  Negative for cough, hemoptysis and shortness of breath.   Cardiovascular: Negative.  Negative for chest pain and leg swelling.  Gastrointestinal: Positive for abdominal pain. Negative for blood in stool, constipation, diarrhea, melena, nausea and vomiting.  Genitourinary: Negative.  Negative for dysuria and hematuria.  Musculoskeletal: Negative.  Negative for back pain.  Skin: Negative.  Negative for rash.  Neurological: Negative.  Negative for dizziness, focal weakness, weakness and headaches.  Psychiatric/Behavioral: Negative.  The patient is not nervous/anxious.     As per HPI. Otherwise, a complete review of systems is negative.  PAST MEDICAL HISTORY: Past Medical History:  Diagnosis Date  . Asthma   . Cancer ([place]) 1978   colon ca  . COPD (chronic obstructive pulmonary disease) ([place])   . GERD (gastroesophageal reflux disease)     . History of hiatal hernia     PAST SURGICAL HISTORY: Past Surgical History:  Procedure Laterality Date  . ABDOMINAL HYSTERECTOMY    . APPENDECTOMY    . bowel obstruction    . CATARACT EXTRACTION W/ INTRAOCULAR LENS  IMPLANT, BILATERAL    . CHOLECYSTECTOMY    . COLON SURGERY     colon cancer  . COLONOSCOPY WITH PROPOFOL N/A 01/18/2018   Procedure: COLONOSCOPY WITH PROPOFOL;  Surgeon: Jonathon Bellows, MD;  Location: [facility] ENDOSCOPY;  Service: Gastroenterology;  Laterality: N/A;  . CYSTOSCOPY W/ URETERAL STENT PLACEMENT Right 02/06/2018   Procedure: CYSTOSCOPY WITH RETROGRADE PYELOGRAM;  Surgeon: Hollice Espy, MD;  Location: [facility] ORS;  Service: Urology;  Laterality: Right;  . CYSTOSCOPY WITH URETEROSCOPY Right 02/06/2018   Procedure: CYSTOSCOPY WITH URETEROSCOPY;  Surgeon: Hollice Espy, MD;  Location: [facility] ORS;  Service: Urology;  Laterality: Right;  . PORTA CATH INSERTION N/A 02/11/2018   Procedure: PORTA CATH INSERTION;  Surgeon: Algernon Huxley, MD;  Location: [place] CV LAB;  Service: Cardiovascular;  Laterality: N/A;  . THYROID SURGERY     nodule removed  . THYROIDECTOMY    . TONSILLECTOMY    . URETERAL BIOPSY Right 02/06/2018   Procedure: URETERAL BIOPSY;  Surgeon: Hollice Espy, MD;  Location: [facility] ORS;  Service: Urology;  Laterality: Right;    FAMILY HISTORY: Negative and noncontributory. No family history on file.  ADVANCED DIRECTIVES (Y/N):  N  HEALTH MAINTENANCE: Social History   Tobacco Use  . Smoking status: Never Smoker  . Smokeless tobacco: Never Used  Substance Use Topics  . Alcohol use: Yes    Comment: rarely  . Drug use: No  Colonoscopy:  PAP:  Bone density:  Lipid panel:  Allergies  Allergen Reactions  . Demerol [Meperidine] Swelling  . Sulfa Antibiotics Swelling    Current Outpatient Medications  Medication Sig Dispense Refill  . acetaminophen (TYLENOL) 325 MG tablet Take 325 mg by mouth every 6 (six) hours as needed.    Marland Kitchen  albuterol (PROVENTIL HFA;VENTOLIN HFA) 108 (90 Base) MCG/ACT inhaler Inhale into the lungs.    Marland Kitchen aspirin EC 81 MG tablet Take by mouth.    . budesonide (PULMICORT FLEXHALER) 180 MCG/ACT inhaler Inhale into the lungs.    . Cyanocobalamin 2500 MCG SUBL Place under the tongue.    . famotidine (PEPCID) 40 MG tablet Take by mouth.    . fluticasone (FLONASE) 50 MCG/ACT nasal spray USE 2 SPRAYS INTO BOTH     NOSTRILS NIGHTLY    . HYDROcodone-acetaminophen (NORCO/VICODIN) 5-325 MG tablet TK 1 T PO Q 6 H PRN  0  . lidocaine-prilocaine (EMLA) cream Apply to affected area once 30 g 3  . mirabegron ER (MYRBETRIQ) 25 MG TB24 tablet Take 1 tablet (25 mg total) by mouth daily. 30 tablet 4  . montelukast (SINGULAIR) 10 MG tablet TAKE 1 TABLET DAILY    . Multiple Vitamin (MULTI-VITAMINS) TABS Take by mouth.    . Multiple Vitamins-Minerals (MULTIVITAMIN ADULT PO) Take by mouth.    . ondansetron (ZOFRAN) 8 MG tablet Take 1 tablet (8 mg total) by mouth 2 (two) times daily as needed (Nausea or vomiting). 30 tablet 2  . prochlorperazine (COMPAZINE) 10 MG tablet Take 1 tablet (10 mg total) by mouth every 6 (six) hours as needed (Nausea or vomiting). 60 tablet 2  . tamsulosin (FLOMAX) 0.4 MG CAPS capsule Take by mouth.     No current facility-administered medications for this visit.     OBJECTIVE: Vitals:   03/05/18 0916  BP: 132/75  Pulse: 76  Temp: (!) 97 F (36.1 C)     Body mass index is 25.7 kg/m.    ECOG FS:0 - Asymptomatic  General: Well-developed, well-nourished, no acute distress. Eyes: Pink conjunctiva, anicteric sclera. HEENT: Normocephalic, moist mucous membranes. Lungs: Clear to auscultation bilaterally. Heart: Regular rate and rhythm. No rubs, murmurs, or gallops. Abdomen: Soft, nontender, nondistended. No organomegaly noted, normoactive bowel sounds. Musculoskeletal: No edema, cyanosis, or clubbing. Neuro: Alert, answering all questions appropriately. Cranial nerves grossly intact. Skin:  No rashes or petechiae noted. Psych: Normal affect.  LAB RESULTS:  Lab Results  Component Value Date   NA 138 03/05/2018   K 4.0 03/05/2018   CL 109 03/05/2018   CO2 22 03/05/2018   GLUCOSE 106 (H) 03/05/2018   BUN 21 03/05/2018   CREATININE 1.41 (H) 03/05/2018   CALCIUM 9.0 03/05/2018   PROT 6.5 03/05/2018   ALBUMIN 3.3 (L) 03/05/2018   AST 23 03/05/2018   ALT 12 03/05/2018   ALKPHOS 101 03/05/2018   BILITOT 0.6 03/05/2018   GFRNONAA 32 (L) 03/05/2018   GFRAA 37 (L) 03/05/2018    Lab Results  Component Value Date   WBC 10.2 03/05/2018   NEUTROABS 6.7 03/05/2018   HGB 9.9 (L) 03/05/2018   HCT 31.4 (L) 03/05/2018   MCV 90.5 03/05/2018   PLT 203 03/05/2018     STUDIES: No results found.  ASSESSMENT: Urothelial carcinoma, PD-L1 0%.  PLAN:    1.  Urothelial carcinoma: Case discussed with urology.  Recent cystoscopy on February 06, 2018 revealed a right ureteral mass.  Cytology report consistent with high-grade urothelial carcinoma consistent with  previous biopsy of abdominal mass.  Unfortunately, patient's PDL 1 is 0% therefore Tecentriq may not offer much benefit.  Will proceed with cycle 2 as planned, but then patient return to clinic in 3 weeks and switch treatments to single agent gemcitabine on days 1, 8, and 15 with day 22 off.  Given her advanced age and decreased kidney function, hesitant to add cisplatin.  Will dose reduce gemcitabine 20% upon initiation of treatment.   2.  Hydronephrosis: Stent placement was not possible.  Nephrostomy tube was briefly discussed, but patient declined this at this time. 3.  Renal insufficiency: Creatinine slightly improved today.  Likely secondary to obstruction from tumor.  No nephrostomy tubes at this time.  No cisplatin as above.  Continue to monitor closely. 4.  Anemia: Patient's hemoglobin is decreased, but stable at 9.9.  Monitor.  Patient expressed understanding and was in agreement with this plan. She also understands that  She can call clinic at any time with any questions, concerns, or complaints.   Cancer Staging No matching staging information was found for the patient.  Lloyd Huger, MD   03/05/2018 10:36 AM

## 2018-03-05 ENCOUNTER — Inpatient Hospital Stay: Payer: Medicare Other

## 2018-03-05 ENCOUNTER — Other Ambulatory Visit: Payer: Self-pay

## 2018-03-05 ENCOUNTER — Inpatient Hospital Stay (HOSPITAL_BASED_OUTPATIENT_CLINIC_OR_DEPARTMENT_OTHER): Payer: Medicare Other | Admitting: Oncology

## 2018-03-05 VITALS — BP 132/75 | HR 76 | Temp 97.0°F | Wt 136.0 lb

## 2018-03-05 DIAGNOSIS — Z5112 Encounter for antineoplastic immunotherapy: Secondary | ICD-10-CM

## 2018-03-05 DIAGNOSIS — N281 Cyst of kidney, acquired: Secondary | ICD-10-CM

## 2018-03-05 DIAGNOSIS — D649 Anemia, unspecified: Secondary | ICD-10-CM

## 2018-03-05 DIAGNOSIS — J449 Chronic obstructive pulmonary disease, unspecified: Secondary | ICD-10-CM

## 2018-03-05 DIAGNOSIS — C801 Malignant (primary) neoplasm, unspecified: Secondary | ICD-10-CM

## 2018-03-05 DIAGNOSIS — Z515 Encounter for palliative care: Secondary | ICD-10-CM | POA: Diagnosis not present

## 2018-03-05 DIAGNOSIS — E279 Disorder of adrenal gland, unspecified: Secondary | ICD-10-CM

## 2018-03-05 DIAGNOSIS — Z7982 Long term (current) use of aspirin: Secondary | ICD-10-CM

## 2018-03-05 DIAGNOSIS — R599 Enlarged lymph nodes, unspecified: Secondary | ICD-10-CM

## 2018-03-05 DIAGNOSIS — K219 Gastro-esophageal reflux disease without esophagitis: Secondary | ICD-10-CM

## 2018-03-05 DIAGNOSIS — K449 Diaphragmatic hernia without obstruction or gangrene: Secondary | ICD-10-CM

## 2018-03-05 DIAGNOSIS — C679 Malignant neoplasm of bladder, unspecified: Secondary | ICD-10-CM

## 2018-03-05 DIAGNOSIS — C689 Malignant neoplasm of urinary organ, unspecified: Secondary | ICD-10-CM

## 2018-03-05 DIAGNOSIS — Z79899 Other long term (current) drug therapy: Secondary | ICD-10-CM

## 2018-03-05 DIAGNOSIS — N1330 Unspecified hydronephrosis: Secondary | ICD-10-CM

## 2018-03-05 LAB — CBC WITH DIFFERENTIAL/PLATELET
Abs Immature Granulocytes: 0.03 10*3/uL (ref 0.00–0.07)
Basophils Absolute: 0.2 10*3/uL — ABNORMAL HIGH (ref 0.0–0.1)
Basophils Relative: 2 %
Eosinophils Absolute: 1.7 10*3/uL — ABNORMAL HIGH (ref 0.0–0.5)
Eosinophils Relative: 17 %
HEMATOCRIT: 31.4 % — AB (ref 36.0–46.0)
HEMOGLOBIN: 9.9 g/dL — AB (ref 12.0–15.0)
Immature Granulocytes: 0 %
LYMPHS ABS: 0.8 10*3/uL (ref 0.7–4.0)
Lymphocytes Relative: 8 %
MCH: 28.5 pg (ref 26.0–34.0)
MCHC: 31.5 g/dL (ref 30.0–36.0)
MCV: 90.5 fL (ref 80.0–100.0)
Monocytes Absolute: 0.8 10*3/uL (ref 0.1–1.0)
Monocytes Relative: 8 %
Neutro Abs: 6.7 10*3/uL (ref 1.7–7.7)
Neutrophils Relative %: 65 %
Platelets: 203 10*3/uL (ref 150–400)
RBC: 3.47 MIL/uL — ABNORMAL LOW (ref 3.87–5.11)
RDW: 12.6 % (ref 11.5–15.5)
WBC: 10.2 10*3/uL (ref 4.0–10.5)
nRBC: 0 % (ref 0.0–0.2)

## 2018-03-05 LAB — COMPREHENSIVE METABOLIC PANEL
ALK PHOS: 101 U/L (ref 38–126)
ALT: 12 U/L (ref 0–44)
AST: 23 U/L (ref 15–41)
Albumin: 3.3 g/dL — ABNORMAL LOW (ref 3.5–5.0)
Anion gap: 7 (ref 5–15)
BUN: 21 mg/dL (ref 8–23)
CALCIUM: 9 mg/dL (ref 8.9–10.3)
CO2: 22 mmol/L (ref 22–32)
Chloride: 109 mmol/L (ref 98–111)
Creatinine, Ser: 1.41 mg/dL — ABNORMAL HIGH (ref 0.44–1.00)
GFR calc non Af Amer: 32 mL/min — ABNORMAL LOW (ref >60)
GFR, EST AFRICAN AMERICAN: 37 mL/min — AB (ref >60)
Glucose, Bld: 106 mg/dL — ABNORMAL HIGH (ref 70–99)
Potassium: 4 mmol/L (ref 3.5–5.1)
Sodium: 138 mmol/L (ref 135–145)
TOTAL PROTEIN: 6.5 g/dL (ref 6.5–8.1)
Total Bilirubin: 0.6 mg/dL (ref 0.3–1.2)

## 2018-03-05 NOTE — Progress Notes (Signed)
Patient is here today to follow up on her Malignant neolasm of urinary bladder. Patient stated that she's been having some back and right low abdominal pain. Patient is currently taking pain medication and she stated that it had been helping her. Patient also stated that she would like a prescription for a walker.

## 2018-03-06 LAB — THYROID PANEL WITH TSH
Free Thyroxine Index: 1.6 (ref 1.2–4.9)
T3 Uptake Ratio: 24 % (ref 24–39)
T4, Total: 6.5 ug/dL (ref 4.5–12.0)
TSH: 4.24 u[IU]/mL (ref 0.450–4.500)

## 2018-03-07 ENCOUNTER — Inpatient Hospital Stay: Payer: Medicare Other

## 2018-03-07 ENCOUNTER — Inpatient Hospital Stay (HOSPITAL_BASED_OUTPATIENT_CLINIC_OR_DEPARTMENT_OTHER): Payer: Medicare Other | Admitting: Hospice and Palliative Medicine

## 2018-03-07 ENCOUNTER — Other Ambulatory Visit: Payer: Medicare Other

## 2018-03-07 ENCOUNTER — Ambulatory Visit: Payer: Medicare Other | Admitting: Nurse Practitioner

## 2018-03-07 ENCOUNTER — Telehealth: Payer: Self-pay

## 2018-03-07 VITALS — BP 132/74 | HR 71 | Temp 95.5°F | Resp 18

## 2018-03-07 DIAGNOSIS — K219 Gastro-esophageal reflux disease without esophagitis: Secondary | ICD-10-CM

## 2018-03-07 DIAGNOSIS — R110 Nausea: Secondary | ICD-10-CM

## 2018-03-07 DIAGNOSIS — R630 Anorexia: Secondary | ICD-10-CM

## 2018-03-07 DIAGNOSIS — Z515 Encounter for palliative care: Secondary | ICD-10-CM | POA: Diagnosis not present

## 2018-03-07 DIAGNOSIS — C801 Malignant (primary) neoplasm, unspecified: Secondary | ICD-10-CM

## 2018-03-07 DIAGNOSIS — N1330 Unspecified hydronephrosis: Secondary | ICD-10-CM

## 2018-03-07 DIAGNOSIS — R599 Enlarged lymph nodes, unspecified: Secondary | ICD-10-CM

## 2018-03-07 DIAGNOSIS — D649 Anemia, unspecified: Secondary | ICD-10-CM

## 2018-03-07 DIAGNOSIS — Z79899 Other long term (current) drug therapy: Secondary | ICD-10-CM

## 2018-03-07 DIAGNOSIS — R531 Weakness: Secondary | ICD-10-CM

## 2018-03-07 DIAGNOSIS — Z5112 Encounter for antineoplastic immunotherapy: Secondary | ICD-10-CM | POA: Diagnosis not present

## 2018-03-07 DIAGNOSIS — N281 Cyst of kidney, acquired: Secondary | ICD-10-CM

## 2018-03-07 DIAGNOSIS — C689 Malignant neoplasm of urinary organ, unspecified: Secondary | ICD-10-CM

## 2018-03-07 DIAGNOSIS — K449 Diaphragmatic hernia without obstruction or gangrene: Secondary | ICD-10-CM

## 2018-03-07 DIAGNOSIS — G893 Neoplasm related pain (acute) (chronic): Secondary | ICD-10-CM

## 2018-03-07 DIAGNOSIS — Z7982 Long term (current) use of aspirin: Secondary | ICD-10-CM

## 2018-03-07 DIAGNOSIS — E279 Disorder of adrenal gland, unspecified: Secondary | ICD-10-CM

## 2018-03-07 DIAGNOSIS — J449 Chronic obstructive pulmonary disease, unspecified: Secondary | ICD-10-CM

## 2018-03-07 MED ORDER — SODIUM CHLORIDE 0.9 % IV SOLN
1200.0000 mg | Freq: Once | INTRAVENOUS | Status: AC
  Administered 2018-03-07: 1200 mg via INTRAVENOUS
  Filled 2018-03-07: qty 20

## 2018-03-07 MED ORDER — HEPARIN SOD (PORK) LOCK FLUSH 100 UNIT/ML IV SOLN
500.0000 [IU] | Freq: Once | INTRAVENOUS | Status: AC | PRN
  Administered 2018-03-07: 500 [IU]
  Filled 2018-03-07: qty 5

## 2018-03-07 MED ORDER — SODIUM CHLORIDE 0.9% FLUSH
10.0000 mL | INTRAVENOUS | Status: DC | PRN
  Administered 2018-03-07: 10 mL
  Filled 2018-03-07: qty 10

## 2018-03-07 MED ORDER — SODIUM CHLORIDE 0.9 % IV SOLN
Freq: Once | INTRAVENOUS | Status: AC
  Administered 2018-03-07: 11:00:00 via INTRAVENOUS
  Filled 2018-03-07: qty 250

## 2018-03-07 NOTE — Progress Notes (Signed)
[place]  Telephone:([phone] [phone]   Name: Kristi Thompson Date: 03/07/2018 MRN: [id number]  DOB: 15-May-1923  Patient Care Team: [name], MD as PCP - General (Internal Medicine) Clent Jacks, RN as Registered Nurse    REASON FOR CONSULTATION: Palliative Care consult requested for this 84 y.o. female with multiple medical problems including asthma, COPD, and GERD, who was recently found to have a pelvic mass with right hydronephrosis and retroperitoneal lymphadenopathy.  Biopsy of the right periadrenal lymph node was consistent with carcinoma of unknown primary but favored urothelial origin.  Patient is being started on atezolizumab.  She was referred to palliative care to help address goals and support her through the treatment process.  SOCIAL HISTORY:    Patient is widowed.  She lives at [facility] in a senior apartment.  She has a daughter who lives in [place] and a son and daughter who lives in [place].  She is originally from [place].  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  ADVANCE DIRECTIVES:  Patient has healthcare power of attorney and living will on file.  Her living will specifies a desire for natural death.  CODE STATUS: to be addressed  PAST MEDICAL HISTORY: Past Medical History:  Diagnosis Date  . Asthma   . Cancer ([place]) 1978   colon ca  . COPD (chronic obstructive pulmonary disease) ([place])   . GERD (gastroesophageal reflux disease)   . History of hiatal hernia     PAST SURGICAL HISTORY:  Past Surgical History:  Procedure Laterality Date  . ABDOMINAL HYSTERECTOMY    . APPENDECTOMY    . bowel obstruction    . CATARACT EXTRACTION W/ INTRAOCULAR LENS  IMPLANT, BILATERAL    . CHOLECYSTECTOMY    . COLON SURGERY     colon cancer  . COLONOSCOPY WITH PROPOFOL N/A 01/18/2018   Procedure: COLONOSCOPY WITH PROPOFOL;  Surgeon: Jonathon Bellows, MD;  Location: [facility] ENDOSCOPY;  Service:  Gastroenterology;  Laterality: N/A;  . CYSTOSCOPY W/ URETERAL STENT PLACEMENT Right 02/06/2018   Procedure: CYSTOSCOPY WITH RETROGRADE PYELOGRAM;  Surgeon: Hollice Espy, MD;  Location: [facility] ORS;  Service: Urology;  Laterality: Right;  . CYSTOSCOPY WITH URETEROSCOPY Right 02/06/2018   Procedure: CYSTOSCOPY WITH URETEROSCOPY;  Surgeon: Hollice Espy, MD;  Location: [facility] ORS;  Service: Urology;  Laterality: Right;  . PORTA CATH INSERTION N/A 02/11/2018   Procedure: PORTA CATH INSERTION;  Surgeon: Algernon Huxley, MD;  Location: [facility];  Service: Cardiovascular;  Laterality: N/A;  . THYROID SURGERY     nodule removed  . THYROIDECTOMY    . TONSILLECTOMY    . URETERAL BIOPSY Right 02/06/2018   Procedure: URETERAL BIOPSY;  Surgeon: Hollice Espy, MD;  Location: [facility] ORS;  Service: Urology;  Laterality: Right;    HEMATOLOGY/ONCOLOGY HISTORY:    Urothelial carcinoma ([place])   02/12/2018 Initial Diagnosis    Urothelial carcinoma ([place])    02/14/2018 -  Chemotherapy    The patient had atezolizumab (TECENTRIQ) 1,200 mg in sodium chloride 0.9 % 250 mL chemo infusion, 1,200 mg, Intravenous, Once, 2 of 6 cycles Administration: 1,200 mg (02/14/2018)  for chemotherapy treatment.      ALLERGIES:  is allergic to demerol [meperidine] and sulfa antibiotics.  MEDICATIONS:  Current Outpatient Medications  Medication Sig Dispense Refill  . acetaminophen (TYLENOL) 325 MG tablet Take 325 mg by mouth every 6 (six) hours as needed.    Marland Kitchen albuterol (PROVENTIL HFA;VENTOLIN HFA) 108 (90 Base)  MCG/ACT inhaler Inhale into the lungs.    Marland Kitchen aspirin EC 81 MG tablet Take by mouth.    . budesonide (PULMICORT FLEXHALER) 180 MCG/ACT inhaler Inhale into the lungs.    . Cyanocobalamin 2500 MCG SUBL Place under the tongue.    . famotidine (PEPCID) 40 MG tablet Take by mouth.    . fluticasone (FLONASE) 50 MCG/ACT nasal spray USE 2 SPRAYS INTO BOTH     NOSTRILS NIGHTLY    . HYDROcodone-acetaminophen  (NORCO/VICODIN) 5-325 MG tablet TK 1 T PO Q 6 H PRN  0  . lidocaine-prilocaine (EMLA) cream Apply to affected area once 30 g 3  . mirabegron ER (MYRBETRIQ) 25 MG TB24 tablet Take 1 tablet (25 mg total) by mouth daily. 30 tablet 4  . montelukast (SINGULAIR) 10 MG tablet TAKE 1 TABLET DAILY    . Multiple Vitamin (MULTI-VITAMINS) TABS Take by mouth.    . Multiple Vitamins-Minerals (MULTIVITAMIN ADULT PO) Take by mouth.    . ondansetron (ZOFRAN) 8 MG tablet Take 1 tablet (8 mg total) by mouth 2 (two) times daily as needed (Nausea or vomiting). 30 tablet 2  . prochlorperazine (COMPAZINE) 10 MG tablet Take 1 tablet (10 mg total) by mouth every 6 (six) hours as needed (Nausea or vomiting). 60 tablet 2  . tamsulosin (FLOMAX) 0.4 MG CAPS capsule Take by mouth.     No current facility-administered medications for this visit.    Facility-Administered Medications Ordered in Other Visits  Medication Dose Route Frequency Provider Last Rate Last Dose  . atezolizumab (TECENTRIQ) 1,200 mg in sodium chloride 0.9 % 250 mL chemo infusion  1,200 mg Intravenous Once Lloyd Huger, MD 540 mL/hr at 03/07/18 1213 1,200 mg at 03/07/18 1213  . heparin lock flush 100 unit/mL  500 Units Intracatheter Once PRN Lloyd Huger, MD      . sodium chloride flush (NS) 0.9 % injection 10 mL  10 mL Intracatheter PRN Lloyd Huger, MD   10 mL at 03/07/18 1127    VITAL SIGNS: LMP  (LMP Unknown)  There were no vitals filed for this visit.  Estimated body mass index is 25.7 kg/m as calculated from the following:   Height as of 02/19/18: '5\' 1"'  (1.549 m).   Weight as of 03/05/18: 136 lb (61.7 kg).  LABS: CBC:    Component Value Date/Time   WBC 10.2 03/05/2018 0852   HGB 9.9 (L) 03/05/2018 0852   HGB 12.9 04/30/2013 2116   HCT 31.4 (L) 03/05/2018 0852   HCT 39.1 04/30/2013 2116   PLT 203 03/05/2018 0852   PLT 229 04/30/2013 2116   MCV 90.5 03/05/2018 0852   MCV 93 04/30/2013 2116   NEUTROABS 6.7  03/05/2018 0852   LYMPHSABS 0.8 03/05/2018 0852   MONOABS 0.8 03/05/2018 0852   EOSABS 1.7 (H) 03/05/2018 0852   BASOSABS 0.2 (H) 03/05/2018 0852   Comprehensive Metabolic Panel:    Component Value Date/Time   NA 138 03/05/2018 0852   NA 140 04/30/2013 2116   K 4.0 03/05/2018 0852   K 3.8 04/30/2013 2116   CL 109 03/05/2018 0852   CL 107 04/30/2013 2116   CO2 22 03/05/2018 0852   CO2 29 04/30/2013 2116   BUN 21 03/05/2018 0852   BUN 25 (H) 04/30/2013 2116   CREATININE 1.41 (H) 03/05/2018 0852   CREATININE 1.01 04/30/2013 2116   GLUCOSE 106 (H) 03/05/2018 0852   GLUCOSE 126 (H) 04/30/2013 2116   CALCIUM 9.0 03/05/2018 6468  CALCIUM 8.9 04/30/2013 2116   AST 23 03/05/2018 0852   ALT 12 03/05/2018 0852   ALKPHOS 101 03/05/2018 0852   BILITOT 0.6 03/05/2018 0852   PROT 6.5 03/05/2018 0852   ALBUMIN 3.3 (L) 03/05/2018 8208    RADIOGRAPHIC STUDIES: No results found.  PERFORMANCE STATUS (ECOG) : 1 - Symptomatic but completely ambulatory  Review of Systems As noted above. Otherwise, a complete review of systems is negative.  Physical Exam General: NAD, frail appearing, thin Cardiovascular: regular rate and rhythm Pulmonary: clear ant fields Abdomen: soft, nontender, + bowel sounds GU: no suprapubic tenderness Extremities: no edema, no joint deformities Skin: no rashes Neurological: Weakness but otherwise nonfocal  IMPRESSION: I met today with patient and daughter today for follow up visit.   Patient has been started on dose reduced gemcitabine.   She has complained of moderately increased abdominal pain and is taking the Norco twice daily. She reports the Norco is helping. Pain is described as having a burning characteristic. I suspect that she might have a neuropathic component. We discussed maximizing Norco since it has been effective. However, consideration could also be given for initiation of gabapentin at renal dosing.   Patient reports generally poor oral  intake over the past few weeks.  Weight has been relatively stable at 136-138lbs. She has occasional nausea without vomiting.  She has been taking Zofran as needed, which has helped.  She also has some constipation but this was improved with OTC laxatives.  We discussed referral to dietitian but patient declined.  I recommended smaller meals more frequently throughout the day with a focus on high-protein and high-calorie.  Patient has also had some increased weakness.  She is now using a walker to help ambulate.  She is [facility] at home at [facility].  However, patient has been staying more often with her family.  We discussed the importance of fall prevention and home safety.  We talked about referral to see OT but this was also declined.  Patient will need to have ACP addressed.  However, this is difficult to do in the infusion suite.  We will see her back for regular clinic visit at time of next appointment Dr. Grayland Ormond.  PLAN: Treatment plan as outlined per oncology Continue supportive care Norco as needed for pain Bowel regimen for constipation Plan to complete MOST Form during a future visit RTC in 3 weeks   Patient expressed understanding and was in agreement with this plan. She also understands that She can call clinic at any time with any questions, concerns, or complaints.    Time Total: 20 minutes  Visit consisted of counseling and education dealing with the complex and emotionally intense issues of symptom management and palliative care in the setting of serious and potentially life-threatening illness.Greater than 50%  of this time was spent counseling and coordinating care related to the above assessment and plan.  Signed by: Altha Harm, PhD, NP-C [phone] (Work Cell)

## 2018-03-07 NOTE — Telephone Encounter (Signed)
-----   Message from Wallene Dales sent at 03/07/2018  1:02 PM EST ----- Regarding: Needs a updated Prescription for Walker Please call Nathalya Wolanski @ [phone]. She would like to get a new prescription for a different walker for the patient. She is aware that no one will be in office until 12/27.    Thank you Jerene Pitch

## 2018-03-07 NOTE — Telephone Encounter (Signed)
Called patient's daughter-Vallerie Sabra Heck but had to leave a voicemail to return my call.

## 2018-03-07 NOTE — Telephone Encounter (Signed)
Kristi Thompson called me back and she stated that she wanted another prescription for a 3-wheel cruiser, lightweight and compact folding walker. I told her that I would leave the prescription at the font desk first floor. [name] agreed.

## 2018-03-08 ENCOUNTER — Other Ambulatory Visit: Payer: Self-pay | Admitting: Oncology

## 2018-03-08 DIAGNOSIS — C689 Malignant neoplasm of urinary organ, unspecified: Secondary | ICD-10-CM

## 2018-03-08 MED ORDER — ONDANSETRON HCL 8 MG PO TABS: 8.0000 mg | ORAL_TABLET | Freq: Two times a day (BID) | ORAL | 2 refills | Status: DC | PRN

## 2018-03-08 MED ORDER — PROCHLORPERAZINE MALEATE 10 MG PO TABS: 10.0000 mg | ORAL_TABLET | Freq: Four times a day (QID) | ORAL | 2 refills | Status: AC | PRN

## 2018-03-08 NOTE — Progress Notes (Signed)
DISCONTINUE OFF PATHWAY REGIMEN - Bladder   OFF10301:Atezolizumab 1,200 mg q21 Days:   A cycle is every 21 days:     Atezolizumab   **Always confirm dose/schedule in your pharmacy ordering system**  REASON: Continuation Of Treatment PRIOR TREATMENT: Off Pathway: Atezolizumab 1,200 mg q21 Days TREATMENT RESPONSE: Unable to Evaluate  START OFF PATHWAY REGIMEN - Bladder   OFF00015:Gemcitabine 1,000 mg/m2 Days 1, 8, 15 q28 Days:   A cycle is every 28 days (3 weeks on and 1 week off):     Gemcitabine   **Always confirm dose/schedule in your pharmacy ordering system**  Patient Characteristics: Metastatic Disease, First Line, No Prior Neoadjuvant/Adjuvant Therapy, Poor Renal Function (CrCl < 50 mL/min), Low PD-L1 Expression AJCC M Category: M1a AJCC N Category: NX AJCC T Category: TX Current evidence of distant metastases<= Yes AJCC 8 Stage Grouping: IVA Line of Therapy: First Line Prior Neoadjuvant/Adjuvant Therapy<= No Renal Function: Poor Renal Function (CrCl < 50 mL/min) PD-L1 Expression Status: Low PD-L1 Expression Intent of Therapy: Non-Curative / Palliative Intent, Discussed with Patient

## 2018-03-23 NOTE — Progress Notes (Signed)
[place]  [phone] [phone]) [phone]  ID: [name] OB: [birth year]  MR#: [id number]  XTG#:[id number]  Patient Care Team: [name], MD as PCP - General (Internal Medicine) Clent Jacks, RN as Registered Nurse  CHIEF COMPLAINT: Urothelial carcinoma.  INTERVAL HISTORY: Patient returns to clinic today for further evaluation and initiation of single agent gemcitabine.  She currently feels well and is asymptomatic.  Her pain has now resolved.  She has no neurologic complaints.  She denies any fevers or night sweats.  She has no chest pain, shortness of breath, cough, or hemoptysis.  She denies any nausea, vomiting, constipation, or diarrhea.  She denies any melena or hematochezia.  She has no urinary complaints.  Patient offers no specific complaints today.  REVIEW OF SYSTEMS:   Review of Systems  Constitutional: Negative.  Negative for fever, malaise/fatigue and weight loss.  Respiratory: Negative.  Negative for cough, hemoptysis and shortness of breath.   Cardiovascular: Negative.  Negative for chest pain and leg swelling.  Gastrointestinal: Negative.  Negative for abdominal pain, blood in stool, constipation, diarrhea, melena, nausea and vomiting.  Genitourinary: Negative.  Negative for dysuria and hematuria.  Musculoskeletal: Negative.  Negative for back pain.  Skin: Negative.  Negative for rash.  Neurological: Negative.  Negative for dizziness, focal weakness, weakness and headaches.  Psychiatric/Behavioral: Negative.  The patient is not nervous/anxious.     As per HPI. Otherwise, a complete review of systems is negative.  PAST MEDICAL HISTORY: Past Medical History:  Diagnosis Date  . Asthma   . Cancer ([place]) 1978   colon ca  . COPD (chronic obstructive pulmonary disease) ([place])   . GERD (gastroesophageal reflux disease)   . History of hiatal hernia     PAST SURGICAL HISTORY: Past Surgical History:  Procedure Laterality  Date  . ABDOMINAL HYSTERECTOMY    . APPENDECTOMY    . bowel obstruction    . CATARACT EXTRACTION W/ INTRAOCULAR LENS  IMPLANT, BILATERAL    . CHOLECYSTECTOMY    . COLON SURGERY     colon cancer  . COLONOSCOPY WITH PROPOFOL N/A 01/18/2018   Procedure: COLONOSCOPY WITH PROPOFOL;  Surgeon: Jonathon Bellows, MD;  Location: [facility] ENDOSCOPY;  Service: Gastroenterology;  Laterality: N/A;  . CYSTOSCOPY W/ URETERAL STENT PLACEMENT Right 02/06/2018   Procedure: CYSTOSCOPY WITH RETROGRADE PYELOGRAM;  Surgeon: Hollice Espy, MD;  Location: [facility] ORS;  Service: Urology;  Laterality: Right;  . CYSTOSCOPY WITH URETEROSCOPY Right 02/06/2018   Procedure: CYSTOSCOPY WITH URETEROSCOPY;  Surgeon: Hollice Espy, MD;  Location: [facility] ORS;  Service: Urology;  Laterality: Right;  . PORTA CATH INSERTION N/A 02/11/2018   Procedure: PORTA CATH INSERTION;  Surgeon: Algernon Huxley, MD;  Location: [place] [facility] LAB;  Service: Cardiovascular;  Laterality: N/A;  . THYROID SURGERY     nodule removed  . THYROIDECTOMY    . TONSILLECTOMY    . URETERAL BIOPSY Right 02/06/2018   Procedure: URETERAL BIOPSY;  Surgeon: Hollice Espy, MD;  Location: [facility] ORS;  Service: Urology;  Laterality: Right;    FAMILY HISTORY: Negative and noncontributory. No family history on file.  ADVANCED DIRECTIVES (Y/N):  N  HEALTH MAINTENANCE: Social History   Tobacco Use  . Smoking status: Never Smoker  . Smokeless tobacco: Never Used  Substance Use Topics  . Alcohol use: Yes    Comment: rarely  . Drug use: No     Colonoscopy:  PAP:  Bone density:  Lipid panel:  Allergies  Allergen Reactions  .  Demerol [Meperidine] Swelling  . Sulfa Antibiotics Swelling    Current Outpatient Medications  Medication Sig Dispense Refill  . acetaminophen (TYLENOL) 325 MG tablet Take 325 mg by mouth every 6 (six) hours as needed.    Marland Kitchen albuterol (PROVENTIL HFA;VENTOLIN HFA) 108 (90 Base) MCG/ACT inhaler Inhale into the lungs.    Marland Kitchen aspirin EC 81  MG tablet Take by mouth.    . budesonide (PULMICORT FLEXHALER) 180 MCG/ACT inhaler Inhale into the lungs.    . Cyanocobalamin 2500 MCG SUBL Place under the tongue.    . famotidine (PEPCID) 40 MG tablet Take by mouth.    . fluticasone (FLONASE) 50 MCG/ACT nasal spray USE 2 SPRAYS INTO BOTH     NOSTRILS NIGHTLY    . HYDROcodone-acetaminophen (NORCO/VICODIN) 5-325 MG tablet TK 1 T PO Q 6 H PRN  0  . mirabegron ER (MYRBETRIQ) 25 MG TB24 tablet Take 1 tablet (25 mg total) by mouth daily. 30 tablet 4  . montelukast (SINGULAIR) 10 MG tablet TAKE 1 TABLET DAILY    . Multiple Vitamin (MULTI-VITAMINS) TABS Take by mouth.    . Multiple Vitamins-Minerals (MULTIVITAMIN ADULT PO) Take by mouth.    . ondansetron (ZOFRAN) 8 MG tablet Take 1 tablet (8 mg total) by mouth 2 (two) times daily as needed (Nausea or vomiting). 30 tablet 2  . prochlorperazine (COMPAZINE) 10 MG tablet Take 1 tablet (10 mg total) by mouth every 6 (six) hours as needed (Nausea or vomiting). 60 tablet 2  . ranitidine (ZANTAC) 300 MG tablet Take 1 tablet by mouth 1 day or 1 dose.    . tamsulosin (FLOMAX) 0.4 MG CAPS capsule Take by mouth.     No current facility-administered medications for this visit.     OBJECTIVE: Vitals:   03/28/18 0929  BP: 139/83  Pulse: 83  Temp: (!) 97.2 F (36.2 C)     Body mass index is 25.38 kg/m.    ECOG FS:0 - Asymptomatic  General: Well-developed, well-nourished, no acute distress. Eyes: Pink conjunctiva, anicteric sclera. HEENT: Normocephalic, moist mucous membranes. Lungs: Clear to auscultation bilaterally. Heart: Regular rate and rhythm. No rubs, murmurs, or gallops. Abdomen: Soft, nontender, nondistended. No organomegaly noted, normoactive bowel sounds. Musculoskeletal: No edema, cyanosis, or clubbing. Neuro: Alert, answering all questions appropriately. Cranial nerves grossly intact. Skin: No rashes or petechiae noted. Psych: Normal affect.  LAB RESULTS:  Lab Results  Component Value  Date   NA 139 03/28/2018   K 4.2 03/28/2018   CL 109 03/28/2018   CO2 21 (L) 03/28/2018   GLUCOSE 173 (H) 03/28/2018   BUN 34 (H) 03/28/2018   CREATININE 1.33 (H) 03/28/2018   CALCIUM 8.9 03/28/2018   PROT 6.5 03/28/2018   ALBUMIN 3.4 (L) 03/28/2018   AST 24 03/28/2018   ALT 12 03/28/2018   ALKPHOS 108 03/28/2018   BILITOT 0.4 03/28/2018   GFRNONAA 35 (L) 03/28/2018   GFRAA 40 (L) 03/28/2018    Lab Results  Component Value Date   WBC 9.0 03/28/2018   NEUTROABS 5.8 03/28/2018   HGB 10.0 (L) 03/28/2018   HCT 32.6 (L) 03/28/2018   MCV 91.1 03/28/2018   PLT 253 03/28/2018     STUDIES: No results found.  ASSESSMENT: Urothelial carcinoma, PDL-1 0%.  PLAN:    1.  Urothelial carcinoma: Case discussed with urology.  Cystoscopy on February 06, 2018 revealed a right ureteral mass.  Cytology report consistent with high-grade urothelial carcinoma consistent with previous biopsy of abdominal mass.  Unfortunately, patient's PDL-1  is 0% therefore Tecentriq may not offer much benefit.  Tecentriq has been discontinued and patient will receive dose reduced single agent gemcitabine on days 1, 8, and 15 with day 22 off.  Given her advanced age and decreased kidney function, hesitant to add cisplatin.  Proceed with cycle 1, day 1 today.  Return to clinic in 1 week for further evaluation and consideration of cycle 1, day 8. 2.  Hydronephrosis: Stent placement was not possible.  Nephrostomy tube was briefly discussed, but patient declined this at this time. 3.  Renal insufficiency: Creatinine continues to slowly improve and is now 1.33.  Likely secondary to obstruction from tumor.  No nephrostomy tubes at this time.  No cisplatin as above.  Continue to monitor closely. 4.  Anemia: Hemoglobin decreased, but relatively unchanged at 10.0.  Monitor. 5.  Pain: Resolved.  Patient expressed understanding and was in agreement with this plan. She also understands that She can call clinic at any time with  any questions, concerns, or complaints.   Cancer Staging No matching staging information was found for the patient.  Lloyd Huger, MD   03/29/2018 6:34 AM

## 2018-03-25 NOTE — Progress Notes (Signed)
02/19/2018 3:57 PM   [name] Feb 20, 1924 [id number]  Referring provider: [name], MD [facility] [place], [place] [address]  Chief Complaint  Patient presents with  . Follow-up    HPI: 85 year old female with metastatic carcinoma, initially of unknown primary presumed to be urothelial who returns today for postop visit.  She was taken to the operating room on 02/06/2018 for right ureteroscopy, biopsy, attempted but failed ureteral stent.  Surgical pathology of the biopsy tissue does confirm that this indeed is a high-grade urothelial carcinoma, findings communicated with Dr. Grayland Ormond.  Intraoperatively, the obstruction appeared to be very high-grade and thus a stent was unable to be placed.  She returns today to discuss management of her obstructed right collecting system.  She denies any flank pain but does have some tenderness to her right lower quadrant which is stable from preop.  Creatinine 1.35 preop which is down from a peak of 1.7 but significantly up from her previous baseline of 0.9 dating back to 2016.  She is followed by Dr. Grayland Ormond of medical oncology, recently underwent port placement by Dr. dew and has started palliative immunotherapy last week in the form of palliative Tecentriq.  She is voiding without difficulty.  No gross hematuria.  See my previous notes for details.  PMH: Past Medical History:  Diagnosis Date  . Asthma   . Cancer ([place]) 1978   colon ca  . COPD (chronic obstructive pulmonary disease) ([place])   . GERD (gastroesophageal reflux disease)   . History of hiatal hernia     Surgical History: Past Surgical History:  Procedure Laterality Date  . ABDOMINAL HYSTERECTOMY    . APPENDECTOMY    . bowel obstruction    . CATARACT EXTRACTION W/ INTRAOCULAR LENS  IMPLANT, BILATERAL    . CHOLECYSTECTOMY    . COLON SURGERY     colon cancer  . COLONOSCOPY WITH PROPOFOL N/A 01/18/2018   Procedure: COLONOSCOPY  WITH PROPOFOL;  Surgeon: Jonathon Bellows, MD;  Location: [facility] ENDOSCOPY;  Service: Gastroenterology;  Laterality: N/A;  . CYSTOSCOPY W/ URETERAL STENT PLACEMENT Right 02/06/2018   Procedure: CYSTOSCOPY WITH RETROGRADE PYELOGRAM;  Surgeon: Hollice Espy, MD;  Location: [facility] ORS;  Service: Urology;  Laterality: Right;  . CYSTOSCOPY WITH URETEROSCOPY Right 02/06/2018   Procedure: CYSTOSCOPY WITH URETEROSCOPY;  Surgeon: Hollice Espy, MD;  Location: [facility] ORS;  Service: Urology;  Laterality: Right;  . PORTA CATH INSERTION N/A 02/11/2018   Procedure: PORTA CATH INSERTION;  Surgeon: Algernon Huxley, MD;  Location: [facility] CV LAB;  Service: Cardiovascular;  Laterality: N/A;  . THYROID SURGERY     nodule removed  . THYROIDECTOMY    . TONSILLECTOMY    . URETERAL BIOPSY Right 02/06/2018   Procedure: URETERAL BIOPSY;  Surgeon: Hollice Espy, MD;  Location: [facility] ORS;  Service: Urology;  Laterality: Right;    Home Medications:  Allergies as of 02/19/2018      Reactions   Demerol [meperidine] Swelling   Sulfa Antibiotics Swelling      Medication List       Accurate as of February 19, 2018 11:59 PM. Always use your most recent med list.        acetaminophen 325 MG tablet Commonly known as:  TYLENOL Take 325 mg by mouth every 6 (six) hours as needed.   albuterol 108 (90 Base) MCG/ACT inhaler Commonly known as:  PROVENTIL HFA;VENTOLIN HFA Inhale into the lungs.   aspirin EC 81 MG tablet Take by  mouth.   Cyanocobalamin 2500 MCG Subl Place under the tongue.   famotidine 40 MG tablet Commonly known as:  PEPCID Take by mouth.   fluticasone 50 MCG/ACT nasal spray Commonly known as:  FLONASE USE 2 SPRAYS INTO BOTH     NOSTRILS NIGHTLY   HYDROcodone-acetaminophen 5-325 MG tablet Commonly known as:  NORCO/VICODIN TK 1 T PO Q 6 H PRN   lidocaine-prilocaine cream Commonly known as:  EMLA Apply to affected area once   mirabegron ER 25 MG Tb24 tablet Commonly known as:   MYRBETRIQ Take 1 tablet (25 mg total) by mouth daily.   montelukast 10 MG tablet Commonly known as:  SINGULAIR TAKE 1 TABLET DAILY   MULTI-VITAMINS Tabs Take by mouth.   ondansetron 8 MG tablet Commonly known as:  ZOFRAN Take 1 tablet (8 mg total) by mouth 2 (two) times daily as needed (Nausea or vomiting).   prochlorperazine 10 MG tablet Commonly known as:  COMPAZINE Take 1 tablet (10 mg total) by mouth every 6 (six) hours as needed (Nausea or vomiting).   PULMICORT FLEXHALER 180 MCG/ACT inhaler Generic drug:  budesonide Inhale into the lungs.   tamsulosin 0.4 MG Caps capsule Commonly known as:  FLOMAX Take by mouth.       Allergies:  Allergies  Allergen Reactions  . Demerol [Meperidine] Swelling  . Sulfa Antibiotics Swelling    Family History: No family history on file.  Social History:  reports that she has never smoked. She has never used smokeless tobacco. She reports current alcohol use. She reports that she does not use drugs.  ROS: UROLOGY Frequent Urination?: No Hard to postpone urination?: No Burning/pain with urination?: No Get up at night to urinate?: No Leakage of urine?: No Urine stream starts and stops?: No Trouble starting stream?: No Do you have to strain to urinate?: No Blood in urine?: No Urinary tract infection?: No Sexually transmitted disease?: No Injury to kidneys or bladder?: No Painful intercourse?: No Weak stream?: No Currently pregnant?: No Vaginal bleeding?: No Last menstrual period?: n  Gastrointestinal Nausea?: No Vomiting?: No Indigestion/heartburn?: No Diarrhea?: No Constipation?: No  Constitutional Fever: No Night sweats?: No Weight loss?: No Fatigue?: No  Skin Skin rash/lesions?: No Itching?: No  Eyes Blurred vision?: No Double vision?: No  Ears/Nose/Throat Sore throat?: No Sinus problems?: No  Hematologic/Lymphatic Swollen glands?: No Easy bruising?: No  Cardiovascular Leg swelling?:  No Chest pain?: No  Respiratory Cough?: No Shortness of breath?: No  Endocrine Excessive thirst?: No  Musculoskeletal Back pain?: No Joint pain?: No  Neurological Headaches?: No Dizziness?: No  Psychologic Depression?: No Anxiety?: No  Physical Exam: BP (!) 112/57   Pulse 93   Ht 5\' 1"  (1.549 m)   Wt 140 lb (63.5 kg)   LMP  (LMP Unknown)   BMI 26.45 kg/m   Constitutional:  Alert and oriented, No acute distress.  Accompanied by daughter today.  Are slightly younger than stated age. HEENT: [place] AT, moist mucus membranes.  Trachea midline, no masses. Cardiovascular: No clubbing, cyanosis, or edema. Respiratory: Normal respiratory effort, no increased work of breathing.. Skin: No rashes, bruises or suspicious lesions. Neurologic: Grossly intact, no focal deficits, moving all 4 extremities. Psychiatric: Normal mood and affect.  Laboratory Data: Creatinine as above    Assessment & Plan:    1. Urothelial carcinoma (HCC) High-grade metastatic urothelial carcinoma Managed by Dr. Grayland Ormond at the cancer center, recently started palliative to eccentric which she is tolerating well  2. Hydronephrosis, unspecified hydronephrosis type  High-grade obstruction of right kidney secondary to obstructing disease Unable to place stent in a retrograde fashion due to high-grade obstruction We discussed alternatives for management of her hydronephrosis which is currently asymptomatic including placement of percutaneous nephrostomy tube At this point time, she is not interested in pursuing this as it may impact the quality of her life She understands that she will continue to have renal atrophy with ongoing obstruction, irrevesible Left kidney normal Risk and benefits were reviewed again today, if her creatinine continues to worsen, she may need percutaneous nephrostomy tube for renal optimization pending her overall treatment goals  3. AKI (acute kidney injury) ([place]) Creatinine 1.3 up  from previous baseline Consider placement of nephrostomy tube if her creatinine rises   F/u as needed, refer back if indicated  Hollice Espy, MD  [place] [address] [place], [place] [address] [phone]

## 2018-03-28 ENCOUNTER — Inpatient Hospital Stay: Payer: Medicare Other | Attending: Oncology

## 2018-03-28 ENCOUNTER — Inpatient Hospital Stay (HOSPITAL_BASED_OUTPATIENT_CLINIC_OR_DEPARTMENT_OTHER): Payer: Medicare Other | Admitting: Hospice and Palliative Medicine

## 2018-03-28 ENCOUNTER — Inpatient Hospital Stay: Payer: Medicare Other

## 2018-03-28 ENCOUNTER — Inpatient Hospital Stay (HOSPITAL_BASED_OUTPATIENT_CLINIC_OR_DEPARTMENT_OTHER): Payer: Medicare Other | Admitting: Oncology

## 2018-03-28 ENCOUNTER — Other Ambulatory Visit: Payer: Self-pay

## 2018-03-28 VITALS — BP 139/83 | HR 83 | Temp 97.2°F | Ht 61.0 in | Wt 134.3 lb

## 2018-03-28 DIAGNOSIS — R609 Edema, unspecified: Secondary | ICD-10-CM | POA: Insufficient documentation

## 2018-03-28 DIAGNOSIS — K5900 Constipation, unspecified: Secondary | ICD-10-CM

## 2018-03-28 DIAGNOSIS — C801 Malignant (primary) neoplasm, unspecified: Secondary | ICD-10-CM

## 2018-03-28 DIAGNOSIS — K449 Diaphragmatic hernia without obstruction or gangrene: Secondary | ICD-10-CM

## 2018-03-28 DIAGNOSIS — Z66 Do not resuscitate: Secondary | ICD-10-CM

## 2018-03-28 DIAGNOSIS — D649 Anemia, unspecified: Secondary | ICD-10-CM

## 2018-03-28 DIAGNOSIS — Z515 Encounter for palliative care: Secondary | ICD-10-CM

## 2018-03-28 DIAGNOSIS — R21 Rash and other nonspecific skin eruption: Secondary | ICD-10-CM | POA: Diagnosis not present

## 2018-03-28 DIAGNOSIS — R630 Anorexia: Secondary | ICD-10-CM

## 2018-03-28 DIAGNOSIS — Z79899 Other long term (current) drug therapy: Secondary | ICD-10-CM

## 2018-03-28 DIAGNOSIS — N1330 Unspecified hydronephrosis: Secondary | ICD-10-CM | POA: Diagnosis not present

## 2018-03-28 DIAGNOSIS — C689 Malignant neoplasm of urinary organ, unspecified: Secondary | ICD-10-CM

## 2018-03-28 DIAGNOSIS — C779 Secondary and unspecified malignant neoplasm of lymph node, unspecified: Secondary | ICD-10-CM

## 2018-03-28 DIAGNOSIS — R5383 Other fatigue: Secondary | ICD-10-CM | POA: Diagnosis not present

## 2018-03-28 DIAGNOSIS — L219 Seborrheic dermatitis, unspecified: Secondary | ICD-10-CM | POA: Insufficient documentation

## 2018-03-28 DIAGNOSIS — Z7982 Long term (current) use of aspirin: Secondary | ICD-10-CM | POA: Insufficient documentation

## 2018-03-28 DIAGNOSIS — J449 Chronic obstructive pulmonary disease, unspecified: Secondary | ICD-10-CM | POA: Insufficient documentation

## 2018-03-28 DIAGNOSIS — K219 Gastro-esophageal reflux disease without esophagitis: Secondary | ICD-10-CM | POA: Diagnosis not present

## 2018-03-28 DIAGNOSIS — Z7189 Other specified counseling: Secondary | ICD-10-CM

## 2018-03-28 DIAGNOSIS — Z801 Family history of malignant neoplasm of trachea, bronchus and lung: Secondary | ICD-10-CM

## 2018-03-28 DIAGNOSIS — C679 Malignant neoplasm of bladder, unspecified: Secondary | ICD-10-CM

## 2018-03-28 LAB — CBC WITH DIFFERENTIAL/PLATELET
Abs Immature Granulocytes: 0.02 10*3/uL (ref 0.00–0.07)
Basophils Absolute: 0.1 10*3/uL (ref 0.0–0.1)
Basophils Relative: 1 %
Eosinophils Absolute: 1.4 10*3/uL — ABNORMAL HIGH (ref 0.0–0.5)
Eosinophils Relative: 15 %
HCT: 32.6 % — ABNORMAL LOW (ref 36.0–46.0)
Hemoglobin: 10 g/dL — ABNORMAL LOW (ref 12.0–15.0)
Immature Granulocytes: 0 %
LYMPHS ABS: 1.1 10*3/uL (ref 0.7–4.0)
Lymphocytes Relative: 13 %
MCH: 27.9 pg (ref 26.0–34.0)
MCHC: 30.7 g/dL (ref 30.0–36.0)
MCV: 91.1 fL (ref 80.0–100.0)
MONOS PCT: 6 %
Monocytes Absolute: 0.6 10*3/uL (ref 0.1–1.0)
Neutro Abs: 5.8 10*3/uL (ref 1.7–7.7)
Neutrophils Relative %: 65 %
Platelets: 253 10*3/uL (ref 150–400)
RBC: 3.58 MIL/uL — ABNORMAL LOW (ref 3.87–5.11)
RDW: 12.8 % (ref 11.5–15.5)
WBC: 9 10*3/uL (ref 4.0–10.5)
nRBC: 0 % (ref 0.0–0.2)

## 2018-03-28 LAB — COMPREHENSIVE METABOLIC PANEL
ALBUMIN: 3.4 g/dL — AB (ref 3.5–5.0)
ALT: 12 U/L (ref 0–44)
AST: 24 U/L (ref 15–41)
Alkaline Phosphatase: 108 U/L (ref 38–126)
Anion gap: 9 (ref 5–15)
BUN: 34 mg/dL — ABNORMAL HIGH (ref 8–23)
CO2: 21 mmol/L — ABNORMAL LOW (ref 22–32)
Calcium: 8.9 mg/dL (ref 8.9–10.3)
Chloride: 109 mmol/L (ref 98–111)
Creatinine, Ser: 1.33 mg/dL — ABNORMAL HIGH (ref 0.44–1.00)
GFR calc Af Amer: 40 mL/min — ABNORMAL LOW (ref >60)
GFR calc non Af Amer: 35 mL/min — ABNORMAL LOW (ref >60)
GLUCOSE: 173 mg/dL — AB (ref 70–99)
Potassium: 4.2 mmol/L (ref 3.5–5.1)
Sodium: 139 mmol/L (ref 135–145)
Total Bilirubin: 0.4 mg/dL (ref 0.3–1.2)
Total Protein: 6.5 g/dL (ref 6.5–8.1)

## 2018-03-28 MED ORDER — PROCHLORPERAZINE MALEATE 10 MG PO TABS
10.0000 mg | ORAL_TABLET | Freq: Once | ORAL | Status: AC
  Administered 2018-03-28: 10 mg via ORAL
  Filled 2018-03-28: qty 1

## 2018-03-28 MED ORDER — HEPARIN SOD (PORK) LOCK FLUSH 100 UNIT/ML IV SOLN
500.0000 [IU] | Freq: Once | INTRAVENOUS | Status: AC | PRN
  Administered 2018-03-28: 500 [IU]
  Filled 2018-03-28: qty 5

## 2018-03-28 MED ORDER — SODIUM CHLORIDE 0.9 % IV SOLN
Freq: Once | INTRAVENOUS | Status: AC
  Administered 2018-03-28: 11:00:00 via INTRAVENOUS
  Filled 2018-03-28: qty 250

## 2018-03-28 MED ORDER — SODIUM CHLORIDE 0.9 % IV SOLN
1300.0000 mg | Freq: Once | INTRAVENOUS | Status: AC
  Administered 2018-03-28: 1300 mg via INTRAVENOUS
  Filled 2018-03-28: qty 26.3

## 2018-03-28 NOTE — Progress Notes (Signed)
Patient is here today to follow up on her Urothelial carcinoma. Patient stated that she had been felling well except for one day weeks after her treatment. Then she started to feel better.Patient denied fever, chills, nausea, vomiting, constipation or diarrhea.

## 2018-03-28 NOTE — Progress Notes (Signed)
[place]  [phone] [phone]   Name: Kristi Thompson Date: 03/28/2018 MRN: [id number]  DOB: [birth year]  Patient Care Team: [name], MD as PCP - General (Internal Medicine) Clent Jacks, RN as Registered Nurse    REASON FOR CONSULTATION: Palliative Care consult requested for this 85 y.o. female with multiple medical problems including asthma, COPD, and GERD, who was recently found to have a pelvic mass with right hydronephrosis and retroperitoneal lymphadenopathy.  Biopsy of the right periadrenal lymph node was consistent with carcinoma of unknown primary but favored urothelial origin.  Patient was treated on atezolizumab.  She was referred to palliative care to help address goals and support her through the treatment process.  SOCIAL HISTORY:    Patient is widowed.  She lives at [facility] in a senior apartment.  She has a daughter who lives in [place] and a son and daughter who lives in [place].  She is originally from [place].  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  ADVANCE DIRECTIVES:  Patient has healthcare power of attorney and living will on file.  Her living will specifies a desire for natural death.  CODE STATUS: DNR  PAST MEDICAL HISTORY: Past Medical History:  Diagnosis Date  . Asthma   . Cancer ([place]) 1978   colon ca  . COPD (chronic obstructive pulmonary disease) ([place])   . GERD (gastroesophageal reflux disease)   . History of hiatal hernia     PAST SURGICAL HISTORY:  Past Surgical History:  Procedure Laterality Date  . ABDOMINAL HYSTERECTOMY    . APPENDECTOMY    . bowel obstruction    . CATARACT EXTRACTION W/ INTRAOCULAR LENS  IMPLANT, BILATERAL    . CHOLECYSTECTOMY    . COLON SURGERY     colon cancer  . COLONOSCOPY WITH PROPOFOL N/A 01/18/2018   Procedure: COLONOSCOPY WITH PROPOFOL;  Surgeon: Jonathon Bellows, MD;  Location: [facility] ENDOSCOPY;  Service: Gastroenterology;  Laterality:  N/A;  . CYSTOSCOPY W/ URETERAL STENT PLACEMENT Right 02/06/2018   Procedure: CYSTOSCOPY WITH RETROGRADE PYELOGRAM;  Surgeon: Hollice Espy, MD;  Location: [facility] ORS;  Service: Urology;  Laterality: Right;  . CYSTOSCOPY WITH URETEROSCOPY Right 02/06/2018   Procedure: CYSTOSCOPY WITH URETEROSCOPY;  Surgeon: Hollice Espy, MD;  Location: [facility] ORS;  Service: Urology;  Laterality: Right;  . PORTA CATH INSERTION N/A 02/11/2018   Procedure: PORTA CATH INSERTION;  Surgeon: Algernon Huxley, MD;  Location: [place] CV LAB;  Service: Cardiovascular;  Laterality: N/A;  . THYROID SURGERY     nodule removed  . THYROIDECTOMY    . TONSILLECTOMY    . URETERAL BIOPSY Right 02/06/2018   Procedure: URETERAL BIOPSY;  Surgeon: Hollice Espy, MD;  Location: [facility] ORS;  Service: Urology;  Laterality: Right;    HEMATOLOGY/ONCOLOGY HISTORY:    Urothelial carcinoma ([place])   02/12/2018 Initial Diagnosis    Urothelial carcinoma ([place])    02/14/2018 - 03/27/2018 Chemotherapy    The patient had atezolizumab (TECENTRIQ) 1,200 mg in sodium chloride 0.9 % 250 mL chemo infusion, 1,200 mg, Intravenous, Once, 2 of 6 cycles Administration: 1,200 mg (02/14/2018), 1,200 mg (03/07/2018)  for chemotherapy treatment.     03/28/2018 -  Chemotherapy    The patient had gemcitabine (GEMZAR) 1,292 mg in sodium chloride 0.9 % 100 mL chemo infusion, 800 mg/m2 = 1,292 mg (100 % of original dose 800 mg/m2), Intravenous,  Once, 1 of 4 cycles Dose modification: 800 mg/m2 (original dose 800 mg/m2, Cycle  1, Reason: Patient Age)  for chemotherapy treatment.      ALLERGIES:  is allergic to demerol [meperidine] and sulfa antibiotics.  MEDICATIONS:  Current Outpatient Medications  Medication Sig Dispense Refill  . acetaminophen (TYLENOL) 325 MG tablet Take 325 mg by mouth every 6 (six) hours as needed.    Marland Kitchen albuterol (PROVENTIL HFA;VENTOLIN HFA) 108 (90 Base) MCG/ACT inhaler Inhale into the lungs.    Marland Kitchen aspirin EC 81 MG tablet Take by  mouth.    . budesonide (PULMICORT FLEXHALER) 180 MCG/ACT inhaler Inhale into the lungs.    . Cyanocobalamin 2500 MCG SUBL Place under the tongue.    . famotidine (PEPCID) 40 MG tablet Take by mouth.    . fluticasone (FLONASE) 50 MCG/ACT nasal spray USE 2 SPRAYS INTO BOTH     NOSTRILS NIGHTLY    . HYDROcodone-acetaminophen (NORCO/VICODIN) 5-325 MG tablet TK 1 T PO Q 6 H PRN  0  . mirabegron ER (MYRBETRIQ) 25 MG TB24 tablet Take 1 tablet (25 mg total) by mouth daily. 30 tablet 4  . montelukast (SINGULAIR) 10 MG tablet TAKE 1 TABLET DAILY    . Multiple Vitamin (MULTI-VITAMINS) TABS Take by mouth.    . Multiple Vitamins-Minerals (MULTIVITAMIN ADULT PO) Take by mouth.    . ondansetron (ZOFRAN) 8 MG tablet Take 1 tablet (8 mg total) by mouth 2 (two) times daily as needed (Nausea or vomiting). 30 tablet 2  . prochlorperazine (COMPAZINE) 10 MG tablet Take 1 tablet (10 mg total) by mouth every 6 (six) hours as needed (Nausea or vomiting). 60 tablet 2  . ranitidine (ZANTAC) 300 MG tablet Take 1 tablet by mouth 1 day or 1 dose.    . tamsulosin (FLOMAX) 0.4 MG CAPS capsule Take by mouth.     No current facility-administered medications for this visit.    Facility-Administered Medications Ordered in Other Visits  Medication Dose Route Frequency Provider Last Rate Last Dose  . heparin lock flush 100 unit/mL  500 Units Intracatheter Once PRN Lloyd Huger, MD        VITAL SIGNS: LMP  (LMP Unknown)  There were no vitals filed for this visit.  Estimated body mass index is 25.38 kg/m as calculated from the following:   Height as of an earlier encounter on 03/28/18: '5\' 1"'  (1.549 m).   Weight as of an earlier encounter on 03/28/18: 134 lb 4.8 oz (60.9 kg).  LABS: CBC:    Component Value Date/Time   WBC 9.0 03/28/2018 0859   HGB 10.0 (L) 03/28/2018 0859   HGB 12.9 04/30/2013 2116   HCT 32.6 (L) 03/28/2018 0859   HCT 39.1 04/30/2013 2116   PLT 253 03/28/2018 0859   PLT 229 04/30/2013 2116    MCV 91.1 03/28/2018 0859   MCV 93 04/30/2013 2116   NEUTROABS 5.8 03/28/2018 0859   LYMPHSABS 1.1 03/28/2018 0859   MONOABS 0.6 03/28/2018 0859   EOSABS 1.4 (H) 03/28/2018 0859   BASOSABS 0.1 03/28/2018 0859   Comprehensive Metabolic Panel:    Component Value Date/Time   NA 139 03/28/2018 0859   NA 140 04/30/2013 2116   K 4.2 03/28/2018 0859   K 3.8 04/30/2013 2116   CL 109 03/28/2018 0859   CL 107 04/30/2013 2116   CO2 21 (L) 03/28/2018 0859   CO2 29 04/30/2013 2116   BUN 34 (H) 03/28/2018 0859   BUN 25 (H) 04/30/2013 2116   CREATININE 1.33 (H) 03/28/2018 0859   CREATININE 1.01 04/30/2013 2116   GLUCOSE  173 (H) 03/28/2018 0859   GLUCOSE 126 (H) 04/30/2013 2116   CALCIUM 8.9 03/28/2018 0859   CALCIUM 8.9 04/30/2013 2116   AST 24 03/28/2018 0859   ALT 12 03/28/2018 0859   ALKPHOS 108 03/28/2018 0859   BILITOT 0.4 03/28/2018 0859   PROT 6.5 03/28/2018 0859   ALBUMIN 3.4 (L) 03/28/2018 0859    RADIOGRAPHIC STUDIES: No results found.  PERFORMANCE STATUS (ECOG) : 1 - Symptomatic but completely ambulatory  Review of Systems As noted above. Otherwise, a complete review of systems is negative.  Physical Exam General: NAD, frail appearing, thin Cardiovascular: regular rate and rhythm Pulmonary: clear ant fields Abdomen: soft, nontender, + bowel sounds GU: no suprapubic tenderness Extremities: no edema, no joint deformities Skin: no rashes Neurological: Weakness but otherwise nonfocal  IMPRESSION: I met today with patient and daughter today for follow up visit.   Patient denies any significant changes or concerns. She is no longer having any pain or other distressing symptoms. She does endorse poor appetite but says this is primarily as she does not like the food at [facility]. She plans to stay with her daughter for the next three weeks and thinks she will eat much better there. Weight has been relatively stable, although she is down a few lb over past month.   We  talked about referral to RD but patient declined.   Patient does endorse some fatigue but is still ambulatory with use of a walker. She has no falls. We talked about energy conservation and I offered a referral to OT but patient declined.   We discussed ACP. Patient does have a living will. Her daughter is her HCPOA. Patient says she has a signed DNR order on her fridge at home. I reviewed with her a MOST form but patient says she wants time to think about it.   PLAN: Treatment plan as outlined per oncology Continue supportive care Norco as needed for pain Bowel regimen for constipation MOST form reviewed RTC in 3 weeks   Patient expressed understanding and was in agreement with this plan. She also understands that She can call clinic at any time with any questions, concerns, or complaints.    Time Total: 30 minutes  Visit consisted of counseling and education dealing with the complex and emotionally intense issues of symptom management and palliative care in the setting of serious and potentially life-threatening illness.Greater than 50%  of this time was spent counseling and coordinating care related to the above assessment and plan.  Signed by: Altha Harm, PhD, NP-C [phone] (Work Cell)

## 2018-03-28 NOTE — Progress Notes (Signed)
Pt tolerated infusion well. Pt stable at discharge. 

## 2018-03-29 LAB — THYROID PANEL WITH TSH
Free Thyroxine Index: 1.2 (ref 1.2–4.9)
T3 Uptake Ratio: 22 % — ABNORMAL LOW (ref 24–39)
T4, Total: 5.4 ug/dL (ref 4.5–12.0)
TSH: 4.35 u[IU]/mL (ref 0.450–4.500)

## 2018-03-31 NOTE — Progress Notes (Signed)
[place]  [phone] [phone]) [phone]  ID: Kristi Thompson OB: [birth year]  MR#: [id number]  UVO#:[id number]  Patient Care Team: [name], MD as PCP - General (Internal Medicine) Clent Jacks, RN as Registered Nurse  CHIEF COMPLAINT: Urothelial carcinoma.  INTERVAL HISTORY: Patient returns to clinic today for further evaluation and consideration of cycle 1, day 8 of single agent gemcitabine.  Patient reports she had extreme fatigue for approximately 24 hours after infusion, but then recovered to her baseline.  She currently feels well and is asymptomatic.  Her pain has now resolved.  She has no neurologic complaints.  She denies any fevers or night sweats.  She has no chest pain, shortness of breath, cough, or hemoptysis.  She denies any nausea, vomiting, constipation, or diarrhea.  She denies any melena or hematochezia.  She has no urinary complaints.  Patient offers no specific complaints today.  REVIEW OF SYSTEMS:   Review of Systems  Constitutional: Negative.  Negative for fever, malaise/fatigue and weight loss.  Respiratory: Negative.  Negative for cough, hemoptysis and shortness of breath.   Cardiovascular: Negative.  Negative for chest pain and leg swelling.  Gastrointestinal: Negative.  Negative for abdominal pain, blood in stool, constipation, diarrhea, melena, nausea and vomiting.  Genitourinary: Negative.  Negative for dysuria and hematuria.  Musculoskeletal: Negative.  Negative for back pain.  Skin: Negative.  Negative for rash.  Neurological: Negative.  Negative for dizziness, focal weakness, weakness and headaches.  Psychiatric/Behavioral: Negative.  The patient is not nervous/anxious.     As per HPI. Otherwise, a complete review of systems is negative.  PAST MEDICAL HISTORY: Past Medical History:  Diagnosis Date  . Asthma   . Cancer ([place]) 1978   colon ca  . COPD (chronic obstructive pulmonary disease) ([place])   . GERD  (gastroesophageal reflux disease)   . History of hiatal hernia     PAST SURGICAL HISTORY: Past Surgical History:  Procedure Laterality Date  . ABDOMINAL HYSTERECTOMY    . APPENDECTOMY    . bowel obstruction    . CATARACT EXTRACTION W/ INTRAOCULAR LENS  IMPLANT, BILATERAL    . CHOLECYSTECTOMY    . COLON SURGERY     colon cancer  . COLONOSCOPY WITH PROPOFOL N/A 01/18/2018   Procedure: COLONOSCOPY WITH PROPOFOL;  Surgeon: Jonathon Bellows, MD;  Location[facility] ENDOSCOPY;  Service: Gastroenterology;  Laterality: N/A;  . CYSTOSCOPY W/ URETERAL STENT PLACEMENT Right 02/06/2018   Procedure: CYSTOSCOPY WITH RETROGRADE PYELOGRAM;  Surgeon: Hollice Espy, MD;  Location: [facility] ORS;  Service: Urology;  Laterality: Right;  . CYSTOSCOPY WITH URETEROSCOPY Right 02/06/2018   Procedure: CYSTOSCOPY WITH URETEROSCOPY;  Surgeon: Hollice Espy, MD;  Location: [facility] ORS;  Service: Urology;  Laterality: Right;  . PORTA CATH INSERTION N/A 02/11/2018   Procedure: PORTA CATH INSERTION;  Surgeon: Algernon Huxley, MD;  Location: [place] CV LAB;  Service: Cardiovascular;  Laterality: N/A;  . THYROID SURGERY     nodule removed  . THYROIDECTOMY    . TONSILLECTOMY    . URETERAL BIOPSY Right 02/06/2018   Procedure: URETERAL BIOPSY;  Surgeon: Hollice Espy, MD;  Location: [facility] ORS;  Service: Urology;  Laterality: Right;    FAMILY HISTORY: Negative and noncontributory. History reviewed. No pertinent family history.  ADVANCED DIRECTIVES (Y/N):  N  HEALTH MAINTENANCE: Social History   Tobacco Use  . Smoking status: Never Smoker  . Smokeless tobacco: Never Used  Substance Use Topics  . Alcohol use: Yes    Comment:  rarely  . Drug use: No     Colonoscopy:  PAP:  Bone density:  Lipid panel:  Allergies  Allergen Reactions  . Demerol [Meperidine] Swelling  . Sulfa Antibiotics Swelling    Current Outpatient Medications  Medication Sig Dispense Refill  . acetaminophen (TYLENOL) 325 MG tablet Take 325  mg by mouth every 6 (six) hours as needed.    Marland Kitchen albuterol (PROVENTIL HFA;VENTOLIN HFA) 108 (90 Base) MCG/ACT inhaler Inhale into the lungs.    Marland Kitchen aspirin EC 81 MG tablet Take by mouth.    . budesonide (PULMICORT FLEXHALER) 180 MCG/ACT inhaler Inhale into the lungs.    . Cyanocobalamin 2500 MCG SUBL Place under the tongue.    . famotidine (PEPCID) 40 MG tablet Take by mouth.    . fluticasone (FLONASE) 50 MCG/ACT nasal spray USE 2 SPRAYS INTO BOTH     NOSTRILS NIGHTLY    . HYDROcodone-acetaminophen (NORCO/VICODIN) 5-325 MG tablet TK 1 T PO Q 6 H PRN  0  . Multiple Vitamin (MULTI-VITAMINS) TABS Take by mouth.    . Multiple Vitamins-Minerals (MULTIVITAMIN ADULT PO) Take by mouth.    . ondansetron (ZOFRAN) 8 MG tablet Take 1 tablet (8 mg total) by mouth 2 (two) times daily as needed (Nausea or vomiting). 30 tablet 2  . prochlorperazine (COMPAZINE) 10 MG tablet Take 1 tablet (10 mg total) by mouth every 6 (six) hours as needed (Nausea or vomiting). 60 tablet 2  . ranitidine (ZANTAC) 300 MG tablet Take 1 tablet by mouth 1 day or 1 dose.    . tamsulosin (FLOMAX) 0.4 MG CAPS capsule Take by mouth.    . mirabegron ER (MYRBETRIQ) 25 MG TB24 tablet Take 1 tablet (25 mg total) by mouth daily. (Patient not taking: Reported on 04/04/2018) 30 tablet 4  . montelukast (SINGULAIR) 10 MG tablet TAKE 1 TABLET DAILY     No current facility-administered medications for this visit.     OBJECTIVE: Vitals:   04/04/18 0924  BP: 136/89  Pulse: 76  Resp: 18  Temp: 97.6 F (36.4 C)  SpO2: 98%     Body mass index is 25.51 kg/m.    ECOG FS:0 - Asymptomatic  General: Well-developed, well-nourished, no acute distress. Eyes: Pink conjunctiva, anicteric sclera. HEENT: Normocephalic, moist mucous membranes. Lungs: Clear to auscultation bilaterally. Heart: Regular rate and rhythm. No rubs, murmurs, or gallops. Abdomen: Soft, nontender, nondistended. No organomegaly noted, normoactive bowel sounds. Musculoskeletal: No  edema, cyanosis, or clubbing. Neuro: Alert, answering all questions appropriately. Cranial nerves grossly intact. Skin: No rashes or petechiae noted. Psych: Normal affect.  LAB RESULTS:  Lab Results  Component Value Date   NA 140 04/04/2018   K 4.1 04/04/2018   CL 112 (H) 04/04/2018   CO2 22 04/04/2018   GLUCOSE 113 (H) 04/04/2018   BUN 28 (H) 04/04/2018   CREATININE 1.41 (H) 04/04/2018   CALCIUM 8.9 04/04/2018   PROT 6.5 04/04/2018   ALBUMIN 3.1 (L) 04/04/2018   AST 22 04/04/2018   ALT 15 04/04/2018   ALKPHOS 105 04/04/2018   BILITOT 0.4 04/04/2018   GFRNONAA 32 (L) 04/04/2018   GFRAA 37 (L) 04/04/2018    Lab Results  Component Value Date   WBC 4.4 04/04/2018   NEUTROABS 2.8 04/04/2018   HGB 9.4 (L) 04/04/2018   HCT 30.1 (L) 04/04/2018   MCV 89.9 04/04/2018   PLT 186 04/04/2018     STUDIES: No results found.  ASSESSMENT: Urothelial carcinoma, PDL-1 0%.  PLAN:    1.  Urothelial carcinoma: Case discussed with urology.  Cystoscopy on February 06, 2018 revealed a right ureteral mass.  Cytology report consistent with high-grade urothelial carcinoma consistent with previous biopsy of abdominal mass.  Unfortunately, patient's PDL-1 is 0% therefore Tecentriq may not offer much benefit.  Tecentriq has been discontinued and patient will receive dose reduced single agent gemcitabine on days 1, 8, and 15 with day 22 off.  Given her advanced age and decreased kidney function, hesitant to add cisplatin.  Proceed with cycle 1, day 8.  Return to clinic in 1 week for further evaluation and consideration of cycle 1, day 15.   2.  Hydronephrosis: Stent placement was not possible.  Nephrostomy tube was briefly discussed, but patient declined this at this time. 3.  Renal insufficiency: Creatinine has slightly increased and is now 1.41.   Likely secondary to obstruction from tumor.  No nephrostomy tubes at this time.  No cisplatin as above.  Patient will receive 1 L IV fluids with  treatment.   4.  Anemia: Hemoglobin slowly trending down and is now 9.4.  Monitor. 5.  Pain: Resolved.  Patient expressed understanding and was in agreement with this plan. She also understands that She can call clinic at any time with any questions, concerns, or complaints.   Cancer Staging No matching staging information was found for the patient.  Lloyd Huger, MD   04/05/2018 3:17 PM

## 2018-04-04 ENCOUNTER — Inpatient Hospital Stay: Payer: Medicare Other

## 2018-04-04 ENCOUNTER — Encounter: Payer: Self-pay | Admitting: Oncology

## 2018-04-04 ENCOUNTER — Inpatient Hospital Stay (HOSPITAL_BASED_OUTPATIENT_CLINIC_OR_DEPARTMENT_OTHER): Payer: Medicare Other | Admitting: Oncology

## 2018-04-04 VITALS — BP 136/89 | HR 76 | Temp 97.6°F | Resp 18 | Wt 135.0 lb

## 2018-04-04 DIAGNOSIS — N1330 Unspecified hydronephrosis: Secondary | ICD-10-CM

## 2018-04-04 DIAGNOSIS — C801 Malignant (primary) neoplasm, unspecified: Secondary | ICD-10-CM | POA: Diagnosis not present

## 2018-04-04 DIAGNOSIS — K219 Gastro-esophageal reflux disease without esophagitis: Secondary | ICD-10-CM

## 2018-04-04 DIAGNOSIS — R630 Anorexia: Secondary | ICD-10-CM

## 2018-04-04 DIAGNOSIS — K5900 Constipation, unspecified: Secondary | ICD-10-CM

## 2018-04-04 DIAGNOSIS — C779 Secondary and unspecified malignant neoplasm of lymph node, unspecified: Secondary | ICD-10-CM

## 2018-04-04 DIAGNOSIS — J449 Chronic obstructive pulmonary disease, unspecified: Secondary | ICD-10-CM

## 2018-04-04 DIAGNOSIS — D649 Anemia, unspecified: Secondary | ICD-10-CM | POA: Diagnosis not present

## 2018-04-04 DIAGNOSIS — C679 Malignant neoplasm of bladder, unspecified: Secondary | ICD-10-CM

## 2018-04-04 DIAGNOSIS — Z66 Do not resuscitate: Secondary | ICD-10-CM

## 2018-04-04 DIAGNOSIS — Z79899 Other long term (current) drug therapy: Secondary | ICD-10-CM

## 2018-04-04 DIAGNOSIS — C689 Malignant neoplasm of urinary organ, unspecified: Secondary | ICD-10-CM

## 2018-04-04 DIAGNOSIS — K449 Diaphragmatic hernia without obstruction or gangrene: Secondary | ICD-10-CM

## 2018-04-04 DIAGNOSIS — Z515 Encounter for palliative care: Secondary | ICD-10-CM | POA: Diagnosis not present

## 2018-04-04 DIAGNOSIS — L219 Seborrheic dermatitis, unspecified: Secondary | ICD-10-CM

## 2018-04-04 DIAGNOSIS — R5383 Other fatigue: Secondary | ICD-10-CM

## 2018-04-04 DIAGNOSIS — Z7982 Long term (current) use of aspirin: Secondary | ICD-10-CM

## 2018-04-04 LAB — CBC WITH DIFFERENTIAL/PLATELET
Abs Immature Granulocytes: 0.01 10*3/uL (ref 0.00–0.07)
Basophils Absolute: 0 10*3/uL (ref 0.0–0.1)
Basophils Relative: 1 %
Eosinophils Absolute: 0.1 10*3/uL (ref 0.0–0.5)
Eosinophils Relative: 3 %
HCT: 30.1 % — ABNORMAL LOW (ref 36.0–46.0)
Hemoglobin: 9.4 g/dL — ABNORMAL LOW (ref 12.0–15.0)
Immature Granulocytes: 0 %
Lymphocytes Relative: 22 %
Lymphs Abs: 1 10*3/uL (ref 0.7–4.0)
MCH: 28.1 pg (ref 26.0–34.0)
MCHC: 31.2 g/dL (ref 30.0–36.0)
MCV: 89.9 fL (ref 80.0–100.0)
MONOS PCT: 11 %
Monocytes Absolute: 0.5 10*3/uL (ref 0.1–1.0)
Neutro Abs: 2.8 10*3/uL (ref 1.7–7.7)
Neutrophils Relative %: 63 %
Platelets: 186 10*3/uL (ref 150–400)
RBC: 3.35 MIL/uL — ABNORMAL LOW (ref 3.87–5.11)
RDW: 12.6 % (ref 11.5–15.5)
WBC: 4.4 10*3/uL (ref 4.0–10.5)
nRBC: 0 % (ref 0.0–0.2)

## 2018-04-04 LAB — COMPREHENSIVE METABOLIC PANEL
ALK PHOS: 105 U/L (ref 38–126)
ALT: 15 U/L (ref 0–44)
AST: 22 U/L (ref 15–41)
Albumin: 3.1 g/dL — ABNORMAL LOW (ref 3.5–5.0)
Anion gap: 6 (ref 5–15)
BUN: 28 mg/dL — ABNORMAL HIGH (ref 8–23)
CO2: 22 mmol/L (ref 22–32)
Calcium: 8.9 mg/dL (ref 8.9–10.3)
Chloride: 112 mmol/L — ABNORMAL HIGH (ref 98–111)
Creatinine, Ser: 1.41 mg/dL — ABNORMAL HIGH (ref 0.44–1.00)
GFR calc Af Amer: 37 mL/min — ABNORMAL LOW (ref >60)
GFR, EST NON AFRICAN AMERICAN: 32 mL/min — AB (ref >60)
Glucose, Bld: 113 mg/dL — ABNORMAL HIGH (ref 70–99)
Potassium: 4.1 mmol/L (ref 3.5–5.1)
Sodium: 140 mmol/L (ref 135–145)
Total Bilirubin: 0.4 mg/dL (ref 0.3–1.2)
Total Protein: 6.5 g/dL (ref 6.5–8.1)

## 2018-04-04 MED ORDER — HEPARIN SOD (PORK) LOCK FLUSH 100 UNIT/ML IV SOLN
500.0000 [IU] | Freq: Once | INTRAVENOUS | Status: AC
  Administered 2018-04-04: 500 [IU] via INTRAVENOUS

## 2018-04-04 MED ORDER — PROCHLORPERAZINE MALEATE 10 MG PO TABS
10.0000 mg | ORAL_TABLET | Freq: Once | ORAL | Status: AC
  Administered 2018-04-04: 10 mg via ORAL
  Filled 2018-04-04: qty 1

## 2018-04-04 MED ORDER — SODIUM CHLORIDE 0.9 % IV SOLN
Freq: Once | INTRAVENOUS | Status: AC
  Administered 2018-04-04: 10:00:00 via INTRAVENOUS
  Filled 2018-04-04: qty 250

## 2018-04-04 MED ORDER — SODIUM CHLORIDE 0.9 % IV SOLN
INTRAVENOUS | Status: DC
  Filled 2018-04-04: qty 250

## 2018-04-04 MED ORDER — HEPARIN SOD (PORK) LOCK FLUSH 100 UNIT/ML IV SOLN: 500.0000 [IU] | Freq: Once | INTRAVENOUS | Status: DC

## 2018-04-04 MED ORDER — SODIUM CHLORIDE 0.9 % IV SOLN
1300.0000 mg | Freq: Once | INTRAVENOUS | Status: AC
  Administered 2018-04-04: 1300 mg via INTRAVENOUS
  Filled 2018-04-04: qty 10.52

## 2018-04-04 MED ORDER — SODIUM CHLORIDE 0.9% FLUSH
10.0000 mL | INTRAVENOUS | Status: DC | PRN
  Administered 2018-04-04: 10 mL via INTRAVENOUS
  Filled 2018-04-04: qty 10

## 2018-04-04 NOTE — Progress Notes (Signed)
Verbal order from Dr. Grayland Ormond, patient to receive 1 liter of fluid over one hour today.

## 2018-04-05 LAB — THYROID PANEL WITH TSH
Free Thyroxine Index: 1.4 (ref 1.2–4.9)
T3 Uptake Ratio: 25 % (ref 24–39)
T4, Total: 5.7 ug/dL (ref 4.5–12.0)
TSH: 6.52 u[IU]/mL — ABNORMAL HIGH (ref 0.450–4.500)

## 2018-04-07 NOTE — Progress Notes (Signed)
[place]  [phone]) [phone] Fax:(336) [phone]  ID: [name] OB: [birth year]  MR#: [id number]  FHQ#:[id number]  Patient Care Team: [name], MD as PCP - General (Internal Medicine) Clent Jacks, RN as Registered Nurse  CHIEF COMPLAINT: Urothelial carcinoma.  INTERVAL HISTORY: Patient returns to clinic today for further evaluation and consideration of cycle 1, day 15 of single agent gemcitabine.  She has noted increased edema in her lower extremities, right greater than left.  She also has a mild rash on her torso and upper leg.  She otherwise feels well.  She does not complain of pain today.  She has no neurologic complaints.  She denies any fevers or night sweats.  She has no chest pain, shortness of breath, cough, or hemoptysis.  She denies any nausea, vomiting, constipation, or diarrhea.  She denies any melena or hematochezia.  She has no urinary complaints.  Patient offers no further specific complaints today.  REVIEW OF SYSTEMS:   Review of Systems  Constitutional: Negative.  Negative for fever, malaise/fatigue and weight loss.  Respiratory: Negative.  Negative for cough, hemoptysis and shortness of breath.   Cardiovascular: Positive for leg swelling. Negative for chest pain.  Gastrointestinal: Negative.  Negative for abdominal pain, blood in stool, constipation, diarrhea, melena, nausea and vomiting.  Genitourinary: Negative.  Negative for dysuria and hematuria.  Musculoskeletal: Negative.  Negative for back pain.  Skin: Positive for rash.  Neurological: Negative.  Negative for dizziness, focal weakness, weakness and headaches.  Psychiatric/Behavioral: Negative.  The patient is not nervous/anxious.     As per HPI. Otherwise, a complete review of systems is negative.  PAST MEDICAL HISTORY: Past Medical History:  Diagnosis Date  . Asthma   . Cancer ([place]) 1978   colon ca  . COPD (chronic obstructive pulmonary disease) ([place])   .  GERD (gastroesophageal reflux disease)   . History of hiatal hernia     PAST SURGICAL HISTORY: Past Surgical History:  Procedure Laterality Date  . ABDOMINAL HYSTERECTOMY    . APPENDECTOMY    . bowel obstruction    . CATARACT EXTRACTION W/ INTRAOCULAR LENS  IMPLANT, BILATERAL    . CHOLECYSTECTOMY    . COLON SURGERY     colon cancer  . COLONOSCOPY WITH PROPOFOL N/A 01/18/2018   Procedure: COLONOSCOPY WITH PROPOFOL;  Surgeon: Jonathon Bellows, MD;  Location: [facility] ENDOSCOPY;  Service: Gastroenterology;  Laterality: N/A;  . CYSTOSCOPY W/ URETERAL STENT PLACEMENT Right 02/06/2018   Procedure: CYSTOSCOPY WITH RETROGRADE PYELOGRAM;  Surgeon: Hollice Espy, MD;  Location: [facility] ORS;  Service: Urology;  Laterality: Right;  . CYSTOSCOPY WITH URETEROSCOPY Right 02/06/2018   Procedure: CYSTOSCOPY WITH URETEROSCOPY;  Surgeon: Hollice Espy, MD;  Location: [facility] ORS;  Service: Urology;  Laterality: Right;  . PORTA CATH INSERTION N/A 02/11/2018   Procedure: PORTA CATH INSERTION;  Surgeon: Algernon Huxley, MD;  Location: [place] CV LAB;  Service: Cardiovascular;  Laterality: N/A;  . THYROID SURGERY     nodule removed  . THYROIDECTOMY    . TONSILLECTOMY    . URETERAL BIOPSY Right 02/06/2018   Procedure: URETERAL BIOPSY;  Surgeon: Hollice Espy, MD;  Location: [facility] ORS;  Service: Urology;  Laterality: Right;    FAMILY HISTORY: Negative and noncontributory. No family history on file.  ADVANCED DIRECTIVES (Y/N):  N  HEALTH MAINTENANCE: Social History   Tobacco Use  . Smoking status: Never Smoker  . Smokeless tobacco: Never Used  Substance Use Topics  . Alcohol use: Yes  Comment: rarely  . Drug use: No     Colonoscopy:  PAP:  Bone density:  Lipid panel:  Allergies  Allergen Reactions  . Demerol [Meperidine] Swelling  . Sulfa Antibiotics Swelling    Current Outpatient Medications  Medication Sig Dispense Refill  . acetaminophen (TYLENOL) 325 MG tablet Take 325 mg by mouth  every 6 (six) hours as needed.    Marland Kitchen albuterol (PROVENTIL HFA;VENTOLIN HFA) 108 (90 Base) MCG/ACT inhaler Inhale into the lungs.    Marland Kitchen aspirin EC 81 MG tablet Take by mouth.    . budesonide (PULMICORT FLEXHALER) 180 MCG/ACT inhaler Inhale into the lungs.    . Cyanocobalamin 2500 MCG SUBL Place under the tongue.    . famotidine (PEPCID) 40 MG tablet Take by mouth.    . fluticasone (FLONASE) 50 MCG/ACT nasal spray USE 2 SPRAYS INTO BOTH     NOSTRILS NIGHTLY    . HYDROcodone-acetaminophen (NORCO/VICODIN) 5-325 MG tablet TK 1 T PO Q 6 H PRN  0  . mirabegron ER (MYRBETRIQ) 25 MG TB24 tablet Take 1 tablet (25 mg total) by mouth daily. 30 tablet 4  . montelukast (SINGULAIR) 10 MG tablet TAKE 1 TABLET DAILY    . Multiple Vitamin (MULTI-VITAMINS) TABS Take by mouth.    . Multiple Vitamins-Minerals (MULTIVITAMIN ADULT PO) Take by mouth.    . ondansetron (ZOFRAN) 8 MG tablet Take 1 tablet (8 mg total) by mouth 2 (two) times daily as needed (Nausea or vomiting). 30 tablet 2  . prochlorperazine (COMPAZINE) 10 MG tablet Take 1 tablet (10 mg total) by mouth every 6 (six) hours as needed (Nausea or vomiting). 60 tablet 2  . ranitidine (ZANTAC) 300 MG tablet Take 1 tablet by mouth 1 day or 1 dose.    . tamsulosin (FLOMAX) 0.4 MG CAPS capsule Take by mouth.     No current facility-administered medications for this visit.     OBJECTIVE: Vitals:   04/11/18 0854  BP: 124/72  Pulse: 99  Temp: 97.6 F (36.4 C)     Body mass index is 25.64 kg/m.    ECOG FS:0 - Asymptomatic  General: Well-developed, well-nourished, no acute distress. Eyes: Pink conjunctiva, anicteric sclera. HEENT: Normocephalic, moist mucous membranes. Lungs: Clear to auscultation bilaterally. Heart: Regular rate and rhythm. No rubs, murmurs, or gallops. Abdomen: Soft, nontender, nondistended. No organomegaly noted, normoactive bowel sounds. Musculoskeletal: Bilateral lower extremity edema right greater than left.   Neuro: Alert,  answering all questions appropriately. Cranial nerves grossly intact. Skin: Mild maculopapular rash noted on her torso. Psych: Normal affect.   LAB RESULTS:  Lab Results  Component Value Date   NA 139 04/11/2018   K 4.0 04/11/2018   CL 110 04/11/2018   CO2 22 04/11/2018   GLUCOSE 131 (H) 04/11/2018   BUN 28 (H) 04/11/2018   CREATININE 1.41 (H) 04/11/2018   CALCIUM 8.7 (L) 04/11/2018   PROT 6.3 (L) 04/11/2018   ALBUMIN 3.0 (L) 04/11/2018   AST 43 (H) 04/11/2018   ALT 39 04/11/2018   ALKPHOS 93 04/11/2018   BILITOT 0.5 04/11/2018   GFRNONAA 32 (L) 04/11/2018   GFRAA 37 (L) 04/11/2018    Lab Results  Component Value Date   WBC 4.3 04/11/2018   NEUTROABS 3.1 04/11/2018   HGB 8.9 (L) 04/11/2018   HCT 28.1 (L) 04/11/2018   MCV 89.8 04/11/2018   PLT 77 (L) 04/11/2018     STUDIES: US Venous Img Lower Unilateral Right  Result Date: 04/11/2018 CLINICAL DATA:  Right lower  extremity edema for the past 5-6 days. History of malignancy. Evaluate for DVT. EXAM: RIGHT LOWER EXTREMITY VENOUS DOPPLER ULTRASOUND TECHNIQUE: Gray-scale sonography with graded compression, as well as color Doppler and duplex ultrasound were performed to evaluate the lower extremity deep venous systems from the level of the common femoral vein and including the common femoral, femoral, profunda femoral, popliteal and calf veins including the posterior tibial, peroneal and gastrocnemius veins when visible. The superficial great saphenous vein was also interrogated. Spectral Doppler was utilized to evaluate flow at rest and with distal augmentation maneuvers in the common femoral, femoral and popliteal veins. COMPARISON:  Right lower extremity venous Doppler ultrasound-07/24/2014 FINDINGS: Contralateral Common Femoral Vein: Respiratory phasicity is normal and symmetric with the symptomatic side. No evidence of thrombus. Normal compressibility. Common Femoral Vein: No evidence of thrombus. Normal compressibility,  respiratory phasicity and response to augmentation. Saphenofemoral Junction: No evidence of thrombus. Normal compressibility and flow on color Doppler imaging. Profunda Femoral Vein: No evidence of thrombus. Normal compressibility and flow on color Doppler imaging. Femoral Vein: No evidence of thrombus. Normal compressibility, respiratory phasicity and response to augmentation. Popliteal Vein: No evidence of thrombus. Normal compressibility, respiratory phasicity and response to augmentation. Calf Veins: No evidence of thrombus. Normal compressibility and flow on color Doppler imaging. Superficial Great Saphenous Vein: No evidence of thrombus. Normal compressibility. Venous Reflux:  None. Other Findings:  None. IMPRESSION: No evidence of DVT within the right lower extremity Electronically Signed   By: Sandi Mariscal M.D.   On: 04/11/2018 11:25    ASSESSMENT: Urothelial carcinoma, PDL-1 0%.  PLAN:    1.  Urothelial carcinoma: Case discussed with urology.  Cystoscopy on February 06, 2018 revealed a right ureteral mass.  Cytology report consistent with high-grade urothelial carcinoma consistent with previous biopsy of abdominal mass.  Unfortunately, patient's PDL-1 is 0% therefore Tecentriq may not offer much benefit.  Tecentriq has been discontinued.  Plan was to give gemcitabine on days 1, 8, and 15 with a 22 off.  But given her thrombocytopenia, patient may only be able to tolerate treatment on days 1 and 8.  Given her advanced age and decreased kidney function, hesitant to add cisplatin.  Delay treatment today secondary to thrombocytopenia.  Return to clinic in 1 week for further evaluation and consideration of her next infusion of gemcitabine.    2.  Hydronephrosis: Stent placement was not possible.  Nephrostomy tube was briefly discussed, but patient declined this at this time. 3.  Renal insufficiency: Creatinine remains stable at 1.41.  Likely secondary to obstruction from tumor.  No nephrostomy tubes at  this time.  No cisplatin as above.  Patient will receive 1 L IV fluids with treatment.   4.  Anemia: Patient's hemoglobin continues to slowly trend down and is now 8.9.   5.  Pain: Resolved. 6.  Lower extremity edema: Ultrasound was negative for DVT.  Monitor.  Patient expressed understanding and was in agreement with this plan. She also understands that She can call clinic at any time with any questions, concerns, or complaints.   Cancer Staging No matching staging information was found for the patient.  Lloyd Huger, MD   04/12/2018 6:40 AM

## 2018-04-11 ENCOUNTER — Ambulatory Visit
Admission: RE | Admit: 2018-04-11 | Discharge: 2018-04-11 | Disposition: A | Discharge status: Home / Self Care | Payer: Medicare Other | Source: Ambulatory Visit | Attending: Oncology | Admitting: Oncology

## 2018-04-11 ENCOUNTER — Inpatient Hospital Stay (HOSPITAL_BASED_OUTPATIENT_CLINIC_OR_DEPARTMENT_OTHER): Payer: Medicare Other | Admitting: Oncology

## 2018-04-11 ENCOUNTER — Inpatient Hospital Stay: Payer: Medicare Other

## 2018-04-11 ENCOUNTER — Other Ambulatory Visit: Payer: Self-pay

## 2018-04-11 VITALS — BP 124/72 | HR 99 | Temp 97.6°F | Ht 61.0 in | Wt 135.7 lb

## 2018-04-11 DIAGNOSIS — L219 Seborrheic dermatitis, unspecified: Secondary | ICD-10-CM

## 2018-04-11 DIAGNOSIS — Z515 Encounter for palliative care: Secondary | ICD-10-CM | POA: Diagnosis not present

## 2018-04-11 DIAGNOSIS — R5383 Other fatigue: Secondary | ICD-10-CM

## 2018-04-11 DIAGNOSIS — M7989 Other specified soft tissue disorders: Secondary | ICD-10-CM | POA: Diagnosis present

## 2018-04-11 DIAGNOSIS — R609 Edema, unspecified: Secondary | ICD-10-CM | POA: Diagnosis not present

## 2018-04-11 DIAGNOSIS — C779 Secondary and unspecified malignant neoplasm of lymph node, unspecified: Secondary | ICD-10-CM

## 2018-04-11 DIAGNOSIS — C689 Malignant neoplasm of urinary organ, unspecified: Secondary | ICD-10-CM

## 2018-04-11 DIAGNOSIS — N1330 Unspecified hydronephrosis: Secondary | ICD-10-CM

## 2018-04-11 DIAGNOSIS — C801 Malignant (primary) neoplasm, unspecified: Secondary | ICD-10-CM | POA: Diagnosis not present

## 2018-04-11 DIAGNOSIS — R21 Rash and other nonspecific skin eruption: Secondary | ICD-10-CM

## 2018-04-11 DIAGNOSIS — Z7982 Long term (current) use of aspirin: Secondary | ICD-10-CM

## 2018-04-11 DIAGNOSIS — K219 Gastro-esophageal reflux disease without esophagitis: Secondary | ICD-10-CM

## 2018-04-11 DIAGNOSIS — C679 Malignant neoplasm of bladder, unspecified: Secondary | ICD-10-CM

## 2018-04-11 DIAGNOSIS — K449 Diaphragmatic hernia without obstruction or gangrene: Secondary | ICD-10-CM

## 2018-04-11 DIAGNOSIS — K5900 Constipation, unspecified: Secondary | ICD-10-CM

## 2018-04-11 DIAGNOSIS — D649 Anemia, unspecified: Secondary | ICD-10-CM

## 2018-04-11 DIAGNOSIS — Z66 Do not resuscitate: Secondary | ICD-10-CM

## 2018-04-11 DIAGNOSIS — R630 Anorexia: Secondary | ICD-10-CM

## 2018-04-11 DIAGNOSIS — J449 Chronic obstructive pulmonary disease, unspecified: Secondary | ICD-10-CM

## 2018-04-11 DIAGNOSIS — Z79899 Other long term (current) drug therapy: Secondary | ICD-10-CM

## 2018-04-11 LAB — CBC WITH DIFFERENTIAL/PLATELET
Abs Immature Granulocytes: 0.02 10*3/uL (ref 0.00–0.07)
Basophils Absolute: 0 10*3/uL (ref 0.0–0.1)
Basophils Relative: 0 %
EOS ABS: 0 10*3/uL (ref 0.0–0.5)
Eosinophils Relative: 1 %
HCT: 28.1 % — ABNORMAL LOW (ref 36.0–46.0)
Hemoglobin: 8.9 g/dL — ABNORMAL LOW (ref 12.0–15.0)
Immature Granulocytes: 1 %
Lymphocytes Relative: 20 %
Lymphs Abs: 0.8 10*3/uL (ref 0.7–4.0)
MCH: 28.4 pg (ref 26.0–34.0)
MCHC: 31.7 g/dL (ref 30.0–36.0)
MCV: 89.8 fL (ref 80.0–100.0)
MONO ABS: 0.2 10*3/uL (ref 0.1–1.0)
Monocytes Relative: 5 %
NEUTROS ABS: 3.1 10*3/uL (ref 1.7–7.7)
Neutrophils Relative %: 73 %
Platelets: 77 10*3/uL — ABNORMAL LOW (ref 150–400)
RBC: 3.13 MIL/uL — ABNORMAL LOW (ref 3.87–5.11)
RDW: 12.4 % (ref 11.5–15.5)
WBC: 4.3 10*3/uL (ref 4.0–10.5)
nRBC: 0 % (ref 0.0–0.2)

## 2018-04-11 LAB — COMPREHENSIVE METABOLIC PANEL
ALT: 39 U/L (ref 0–44)
AST: 43 U/L — ABNORMAL HIGH (ref 15–41)
Albumin: 3 g/dL — ABNORMAL LOW (ref 3.5–5.0)
Alkaline Phosphatase: 93 U/L (ref 38–126)
Anion gap: 7 (ref 5–15)
BUN: 28 mg/dL — ABNORMAL HIGH (ref 8–23)
CO2: 22 mmol/L (ref 22–32)
Calcium: 8.7 mg/dL — ABNORMAL LOW (ref 8.9–10.3)
Chloride: 110 mmol/L (ref 98–111)
Creatinine, Ser: 1.41 mg/dL — ABNORMAL HIGH (ref 0.44–1.00)
GFR calc Af Amer: 37 mL/min — ABNORMAL LOW (ref >60)
GFR calc non Af Amer: 32 mL/min — ABNORMAL LOW (ref >60)
Glucose, Bld: 131 mg/dL — ABNORMAL HIGH (ref 70–99)
POTASSIUM: 4 mmol/L (ref 3.5–5.1)
Sodium: 139 mmol/L (ref 135–145)
Total Bilirubin: 0.5 mg/dL (ref 0.3–1.2)
Total Protein: 6.3 g/dL — ABNORMAL LOW (ref 6.5–8.1)

## 2018-04-11 MED ORDER — SODIUM CHLORIDE 0.9% FLUSH
10.0000 mL | Freq: Once | INTRAVENOUS | Status: AC
  Administered 2018-04-11: 10 mL via INTRAVENOUS
  Filled 2018-04-11: qty 10

## 2018-04-11 MED ORDER — HEPARIN SOD (PORK) LOCK FLUSH 100 UNIT/ML IV SOLN
500.0000 [IU] | Freq: Once | INTRAVENOUS | Status: AC
  Administered 2018-04-11: 500 [IU] via INTRAVENOUS

## 2018-04-11 NOTE — Progress Notes (Signed)
Patient is here today to follow up on urothelial carcinoma. Patient has edema on bilateral lower extremities. Patient also stated that she has a rash on her chest, bilateral axilla and on her upper legs.

## 2018-04-12 LAB — THYROID PANEL WITH TSH
Free Thyroxine Index: 1.4 (ref 1.2–4.9)
T3 Uptake Ratio: 24 % (ref 24–39)
T4, Total: 5.9 ug/dL (ref 4.5–12.0)
TSH: 6.28 u[IU]/mL — ABNORMAL HIGH (ref 0.450–4.500)

## 2018-04-12 NOTE — Progress Notes (Signed)
[place]  [phone] [phone]) [phone]  ID: [name] OB: 1924-02-16  MR#: [id number]  XTK#:[id number]  Patient Care Team: [name], MD as PCP - General (Internal Medicine) Clent Jacks, RN as Registered Nurse  CHIEF COMPLAINT: Urothelial carcinoma.  INTERVAL HISTORY: Patient returns to clinic today for further evaluation and consideration of cycle 2, day 1 of single agent gemcitabine.  Day 15 was eliminated from her regimen secondary to thrombocytopenia.  She was evaluated in urgent care earlier this week with chest pain and shortness of breath and was told it was musculoskeletal in nature.  She currently feels well and is asymptomatic. She does not complain of pain today.  She has no neurologic complaints.  She denies any fevers or night sweats.  She has no chest pain, shortness of breath, cough, or hemoptysis.  She denies any nausea, vomiting, constipation, or diarrhea.  She denies any melena or hematochezia.  She has no urinary complaints.  Patient feels at her baseline offers no specific complaints today.  REVIEW OF SYSTEMS:   Review of Systems  Constitutional: Negative.  Negative for fever, malaise/fatigue and weight loss.  Respiratory: Negative.  Negative for cough, hemoptysis and shortness of breath.   Cardiovascular: Positive for leg swelling. Negative for chest pain.  Gastrointestinal: Negative.  Negative for abdominal pain, blood in stool, constipation, diarrhea, melena, nausea and vomiting.  Genitourinary: Negative.  Negative for dysuria and hematuria.  Musculoskeletal: Negative.  Negative for back pain.  Skin: Negative.  Negative for rash.  Neurological: Negative.  Negative for dizziness, focal weakness, weakness and headaches.  Psychiatric/Behavioral: Negative.  The patient is not nervous/anxious.     As per HPI. Otherwise, a complete review of systems is negative.  PAST MEDICAL HISTORY: Past Medical History:    Diagnosis Date  . Asthma   . Cancer ([place]) 1978   colon ca  . COPD (chronic obstructive pulmonary disease) ([place])   . GERD (gastroesophageal reflux disease)   . History of hiatal hernia     PAST SURGICAL HISTORY: Past Surgical History:  Procedure Laterality Date  . ABDOMINAL HYSTERECTOMY    . APPENDECTOMY    . bowel obstruction    . CATARACT EXTRACTION W/ INTRAOCULAR LENS  IMPLANT, BILATERAL    . CHOLECYSTECTOMY    . COLON SURGERY     colon cancer  . COLONOSCOPY WITH PROPOFOL N/A 01/18/2018   Procedure: COLONOSCOPY WITH PROPOFOL;  Surgeon: Jonathon Bellows, MD;  Location: [facility] ENDOSCOPY;  Service: Gastroenterology;  Laterality: N/A;  . CYSTOSCOPY W/ URETERAL STENT PLACEMENT Right 02/06/2018   Procedure: CYSTOSCOPY WITH RETROGRADE PYELOGRAM;  Surgeon: Hollice Espy, MD;  Location: [facility] ORS;  Service: Urology;  Laterality: Right;  . CYSTOSCOPY WITH URETEROSCOPY Right 02/06/2018   Procedure: CYSTOSCOPY WITH URETEROSCOPY;  Surgeon: Hollice Espy, MD;  Location: [facility] ORS;  Service: Urology;  Laterality: Right;  . PORTA CATH INSERTION N/A 02/11/2018   Procedure: PORTA CATH INSERTION;  Surgeon: Algernon Huxley, MD;  Location: [place] CV LAB;  Service: Cardiovascular;  Laterality: N/A;  . THYROID SURGERY     nodule removed  . THYROIDECTOMY    . TONSILLECTOMY    . URETERAL BIOPSY Right 02/06/2018   Procedure: URETERAL BIOPSY;  Surgeon: Hollice Espy, MD;  Location: [facility] ORS;  Service: Urology;  Laterality: Right;    FAMILY HISTORY: Negative and noncontributory. No family history on file.  ADVANCED DIRECTIVES (Y/N):  N  HEALTH MAINTENANCE: Social History   Tobacco Use  . Smoking status:  Never Smoker  . Smokeless tobacco: Never Used  Substance Use Topics  . Alcohol use: Yes    Comment: rarely  . Drug use: No     Colonoscopy:  PAP:  Bone density:  Lipid panel:  Allergies  Allergen Reactions  . Demerol [Meperidine] Swelling  . Sulfa Antibiotics Swelling    Current  Outpatient Medications  Medication Sig Dispense Refill  . acetaminophen (TYLENOL) 325 MG tablet Take 325 mg by mouth every 6 (six) hours as needed.    Marland Kitchen albuterol (PROVENTIL HFA;VENTOLIN HFA) 108 (90 Base) MCG/ACT inhaler Inhale into the lungs.    Marland Kitchen aspirin EC 81 MG tablet Take by mouth.    . budesonide (PULMICORT FLEXHALER) 180 MCG/ACT inhaler Inhale into the lungs.    . Cyanocobalamin 2500 MCG SUBL Place under the tongue.    . famotidine (PEPCID) 40 MG tablet Take by mouth.    . fluticasone (FLONASE) 50 MCG/ACT nasal spray USE 2 SPRAYS INTO BOTH     NOSTRILS NIGHTLY    . HYDROcodone-acetaminophen (NORCO/VICODIN) 5-325 MG tablet TK 1 T PO Q 6 H PRN  0  . mirabegron ER (MYRBETRIQ) 25 MG TB24 tablet Take 1 tablet (25 mg total) by mouth daily. 30 tablet 4  . montelukast (SINGULAIR) 10 MG tablet TAKE 1 TABLET DAILY    . Multiple Vitamin (MULTI-VITAMINS) TABS Take by mouth.    . Multiple Vitamins-Minerals (MULTIVITAMIN ADULT PO) Take by mouth.    . ondansetron (ZOFRAN) 8 MG tablet Take 1 tablet (8 mg total) by mouth 2 (two) times daily as needed (Nausea or vomiting). 30 tablet 2  . prochlorperazine (COMPAZINE) 10 MG tablet Take 1 tablet (10 mg total) by mouth every 6 (six) hours as needed (Nausea or vomiting). 60 tablet 2  . ranitidine (ZANTAC) 300 MG tablet Take 1 tablet by mouth 1 day or 1 dose.    . tamsulosin (FLOMAX) 0.4 MG CAPS capsule Take by mouth.     No current facility-administered medications for this visit.     OBJECTIVE: Vitals:   04/18/18 0859  BP: 140/64  Pulse: 76  Temp: (!) 97 F (36.1 C)     Body mass index is 25.24 kg/m.    ECOG FS:0 - Asymptomatic  General: Well-developed, well-nourished, no acute distress. Eyes: Pink conjunctiva, anicteric sclera. HEENT: Normocephalic, moist mucous membranes. Lungs: Clear to auscultation bilaterally. Heart: Regular rate and rhythm. No rubs, murmurs, or gallops. Abdomen: Soft, nontender, nondistended. No organomegaly noted,  normoactive bowel sounds. Musculoskeletal: No edema, cyanosis, or clubbing. Neuro: Alert, answering all questions appropriately. Cranial nerves grossly intact. Skin: No rashes or petechiae noted. Psych: Normal affect.   LAB RESULTS:  Lab Results  Component Value Date   NA 138 04/18/2018   K 4.0 04/18/2018   CL 108 04/18/2018   CO2 24 04/18/2018   GLUCOSE 164 (H) 04/18/2018   BUN 25 (H) 04/18/2018   CREATININE 1.36 (H) 04/18/2018   CALCIUM 8.7 (L) 04/18/2018   PROT 6.2 (L) 04/18/2018   ALBUMIN 3.2 (L) 04/18/2018   AST 39 04/18/2018   ALT 35 04/18/2018   ALKPHOS 109 04/18/2018   BILITOT 0.3 04/18/2018   GFRNONAA 34 (L) 04/18/2018   GFRAA 39 (L) 04/18/2018    Lab Results  Component Value Date   WBC 5.8 04/18/2018   NEUTROABS 4.0 04/18/2018   HGB 9.0 (L) 04/18/2018   HCT 28.9 (L) 04/18/2018   MCV 92.0 04/18/2018   PLT 375 04/18/2018     STUDIES: US Venous Img  Lower Unilateral Right  Result Date: 04/11/2018 CLINICAL DATA:  Right lower extremity edema for the past 5-6 days. History of malignancy. Evaluate for DVT. EXAM: RIGHT LOWER EXTREMITY VENOUS DOPPLER ULTRASOUND TECHNIQUE: Gray-scale sonography with graded compression, as well as color Doppler and duplex ultrasound were performed to evaluate the lower extremity deep venous systems from the level of the common femoral vein and including the common femoral, femoral, profunda femoral, popliteal and calf veins including the posterior tibial, peroneal and gastrocnemius veins when visible. The superficial great saphenous vein was also interrogated. Spectral Doppler was utilized to evaluate flow at rest and with distal augmentation maneuvers in the common femoral, femoral and popliteal veins. COMPARISON:  Right lower extremity venous Doppler ultrasound-07/24/2014 FINDINGS: Contralateral Common Femoral Vein: Respiratory phasicity is normal and symmetric with the symptomatic side. No evidence of thrombus. Normal compressibility.  Common Femoral Vein: No evidence of thrombus. Normal compressibility, respiratory phasicity and response to augmentation. Saphenofemoral Junction: No evidence of thrombus. Normal compressibility and flow on color Doppler imaging. Profunda Femoral Vein: No evidence of thrombus. Normal compressibility and flow on color Doppler imaging. Femoral Vein: No evidence of thrombus. Normal compressibility, respiratory phasicity and response to augmentation. Popliteal Vein: No evidence of thrombus. Normal compressibility, respiratory phasicity and response to augmentation. Calf Veins: No evidence of thrombus. Normal compressibility and flow on color Doppler imaging. Superficial Great Saphenous Vein: No evidence of thrombus. Normal compressibility. Venous Reflux:  None. Other Findings:  None. IMPRESSION: No evidence of DVT within the right lower extremity Electronically Signed   By: Sandi Mariscal M.D.   On: 04/11/2018 11:25    ASSESSMENT: Urothelial carcinoma, PDL-1 0%.  PLAN:    1.  Urothelial carcinoma: Case discussed with urology.  Cystoscopy on February 06, 2018 revealed a right ureteral mass.  Cytology report with high-grade urothelial carcinoma consistent with previous biopsy of abdominal mass.  Unfortunately, patient's PDL-1 is 0% therefore Tecentriq may not offer much benefit.  Tecentriq has been discontinued.  Plan was to give gemcitabine on days 1, 8, and 15 with a 22 off.  But given her thrombocytopenia, patient may only be able to tolerate treatment on days 1 and 8.  Given her advanced age and decreased kidney function, hesitant to add cisplatin.  Proceed with cycle 2, day 1 of gemcitabine today.  Return to clinic in 1 week for further evaluation and consideration of cycle 2, day 8.  Will repeat imaging after cycle 4. 2.  Hydronephrosis: Stent placement was not possible.  Nephrostomy tube was briefly discussed, but patient declined this at this time. 3.  Renal insufficiency: Creatinine elevated, but stable at  1.36.  Likely secondary to obstruction from tumor.  No nephrostomy tubes at this time.  No cisplatin as above.  Patient will receive 1 L IV fluids with treatment.   4.  Anemia: Patient's hemoglobin is decreased, but stable at 9.0.  Monitor. 5.  Pain: Resolved. 6.  Lower extremity edema: Ultrasound was negative for DVT.  Monitor.  Patient expressed understanding and was in agreement with this plan. She also understands that She can call clinic at any time with any questions, concerns, or complaints.   Cancer Staging No matching staging information was found for the patient.  Lloyd Huger, MD   04/18/2018 2:50 PM

## 2018-04-15 ENCOUNTER — Telehealth: Payer: Self-pay | Admitting: *Deleted

## 2018-04-15 NOTE — Telephone Encounter (Signed)
Daughter contacted the cancer center triage to report "chest pain and shortness of breath." call returned, I also spoke with patient. Pt stated, "I'm having chest pain. It's grabbing and taking my breath away. It's gripping in the center for pain below chest pain." per daughter patient has h/o asthma; nebulizer. I advised patient's daughter and patient to go to ER for evaluation of chest pain. Pt stated that she didn't think this was "medically necessary, I'm not having a heart attack, just chest pain and shortness of breath. I feel that if I use my nebulizer it may get better." Pt stated, "It's just grabbing chest pain in the center of my chest under my breast bone."  I again advised the patient to go to the ER or call 911 for chest pain. Pt stated, "ok, but I think I will go to the urgent care instead." I advised pt to go to the ER for chest pain. Pt stated "ok."

## 2018-04-18 ENCOUNTER — Inpatient Hospital Stay: Payer: Medicare Other | Attending: Hospice and Palliative Medicine | Admitting: Hospice and Palliative Medicine

## 2018-04-18 ENCOUNTER — Other Ambulatory Visit: Payer: Self-pay

## 2018-04-18 ENCOUNTER — Inpatient Hospital Stay (HOSPITAL_BASED_OUTPATIENT_CLINIC_OR_DEPARTMENT_OTHER): Payer: Medicare Other | Admitting: Oncology

## 2018-04-18 ENCOUNTER — Inpatient Hospital Stay: Payer: Medicare Other

## 2018-04-18 VITALS — BP 140/64 | HR 76 | Temp 97.0°F | Ht 61.0 in | Wt 133.6 lb

## 2018-04-18 DIAGNOSIS — K219 Gastro-esophageal reflux disease without esophagitis: Secondary | ICD-10-CM

## 2018-04-18 DIAGNOSIS — R05 Cough: Secondary | ICD-10-CM | POA: Insufficient documentation

## 2018-04-18 DIAGNOSIS — C689 Malignant neoplasm of urinary organ, unspecified: Secondary | ICD-10-CM | POA: Diagnosis not present

## 2018-04-18 DIAGNOSIS — J449 Chronic obstructive pulmonary disease, unspecified: Secondary | ICD-10-CM | POA: Insufficient documentation

## 2018-04-18 DIAGNOSIS — N1330 Unspecified hydronephrosis: Secondary | ICD-10-CM | POA: Diagnosis not present

## 2018-04-18 DIAGNOSIS — G893 Neoplasm related pain (acute) (chronic): Secondary | ICD-10-CM | POA: Diagnosis not present

## 2018-04-18 DIAGNOSIS — D510 Vitamin B12 deficiency anemia due to intrinsic factor deficiency: Secondary | ICD-10-CM | POA: Insufficient documentation

## 2018-04-18 DIAGNOSIS — Z5111 Encounter for antineoplastic chemotherapy: Secondary | ICD-10-CM

## 2018-04-18 DIAGNOSIS — Z79899 Other long term (current) drug therapy: Secondary | ICD-10-CM

## 2018-04-18 DIAGNOSIS — Z85038 Personal history of other malignant neoplasm of large intestine: Secondary | ICD-10-CM | POA: Diagnosis not present

## 2018-04-18 DIAGNOSIS — R509 Fever, unspecified: Secondary | ICD-10-CM | POA: Diagnosis not present

## 2018-04-18 DIAGNOSIS — K449 Diaphragmatic hernia without obstruction or gangrene: Secondary | ICD-10-CM | POA: Insufficient documentation

## 2018-04-18 DIAGNOSIS — Z7982 Long term (current) use of aspirin: Secondary | ICD-10-CM

## 2018-04-18 DIAGNOSIS — R600 Localized edema: Secondary | ICD-10-CM

## 2018-04-18 DIAGNOSIS — Z515 Encounter for palliative care: Secondary | ICD-10-CM | POA: Insufficient documentation

## 2018-04-18 DIAGNOSIS — D701 Agranulocytosis secondary to cancer chemotherapy: Secondary | ICD-10-CM | POA: Insufficient documentation

## 2018-04-18 DIAGNOSIS — R531 Weakness: Secondary | ICD-10-CM | POA: Diagnosis not present

## 2018-04-18 DIAGNOSIS — T451X5A Adverse effect of antineoplastic and immunosuppressive drugs, initial encounter: Secondary | ICD-10-CM | POA: Diagnosis not present

## 2018-04-18 DIAGNOSIS — C679 Malignant neoplasm of bladder, unspecified: Secondary | ICD-10-CM

## 2018-04-18 DIAGNOSIS — E46 Unspecified protein-calorie malnutrition: Secondary | ICD-10-CM

## 2018-04-18 LAB — COMPREHENSIVE METABOLIC PANEL
ALBUMIN: 3.2 g/dL — AB (ref 3.5–5.0)
ALT: 35 U/L (ref 0–44)
AST: 39 U/L (ref 15–41)
Alkaline Phosphatase: 109 U/L (ref 38–126)
Anion gap: 6 (ref 5–15)
BILIRUBIN TOTAL: 0.3 mg/dL (ref 0.3–1.2)
BUN: 25 mg/dL — ABNORMAL HIGH (ref 8–23)
CO2: 24 mmol/L (ref 22–32)
Calcium: 8.7 mg/dL — ABNORMAL LOW (ref 8.9–10.3)
Chloride: 108 mmol/L (ref 98–111)
Creatinine, Ser: 1.36 mg/dL — ABNORMAL HIGH (ref 0.44–1.00)
GFR calc Af Amer: 39 mL/min — ABNORMAL LOW (ref >60)
GFR calc non Af Amer: 34 mL/min — ABNORMAL LOW (ref >60)
Glucose, Bld: 164 mg/dL — ABNORMAL HIGH (ref 70–99)
Potassium: 4 mmol/L (ref 3.5–5.1)
Sodium: 138 mmol/L (ref 135–145)
Total Protein: 6.2 g/dL — ABNORMAL LOW (ref 6.5–8.1)

## 2018-04-18 LAB — CBC WITH DIFFERENTIAL/PLATELET
ABS IMMATURE GRANULOCYTES: 0.02 10*3/uL (ref 0.00–0.07)
BASOS PCT: 1 %
Basophils Absolute: 0.1 10*3/uL (ref 0.0–0.1)
Eosinophils Absolute: 0.2 10*3/uL (ref 0.0–0.5)
Eosinophils Relative: 4 %
HCT: 28.9 % — ABNORMAL LOW (ref 36.0–46.0)
Hemoglobin: 9 g/dL — ABNORMAL LOW (ref 12.0–15.0)
Immature Granulocytes: 0 %
Lymphocytes Relative: 16 %
Lymphs Abs: 0.9 10*3/uL (ref 0.7–4.0)
MCH: 28.7 pg (ref 26.0–34.0)
MCHC: 31.1 g/dL (ref 30.0–36.0)
MCV: 92 fL (ref 80.0–100.0)
Monocytes Absolute: 0.6 10*3/uL (ref 0.1–1.0)
Monocytes Relative: 11 %
Neutro Abs: 4 10*3/uL (ref 1.7–7.7)
Neutrophils Relative %: 68 %
Platelets: 375 10*3/uL (ref 150–400)
RBC: 3.14 MIL/uL — ABNORMAL LOW (ref 3.87–5.11)
RDW: 14.1 % (ref 11.5–15.5)
WBC: 5.8 10*3/uL (ref 4.0–10.5)
nRBC: 0 % (ref 0.0–0.2)

## 2018-04-18 MED ORDER — HEPARIN SOD (PORK) LOCK FLUSH 100 UNIT/ML IV SOLN
500.0000 [IU] | Freq: Once | INTRAVENOUS | Status: AC
  Administered 2018-04-18: 500 [IU] via INTRAVENOUS
  Filled 2018-04-18: qty 5

## 2018-04-18 MED ORDER — PROCHLORPERAZINE MALEATE 10 MG PO TABS
10.0000 mg | ORAL_TABLET | Freq: Once | ORAL | Status: AC
  Administered 2018-04-18: 10 mg via ORAL
  Filled 2018-04-18: qty 1

## 2018-04-18 MED ORDER — SODIUM CHLORIDE 0.9 % IV SOLN
Freq: Once | INTRAVENOUS | Status: AC
  Administered 2018-04-18: 10:00:00 via INTRAVENOUS
  Filled 2018-04-18: qty 250

## 2018-04-18 MED ORDER — SODIUM CHLORIDE 0.9% FLUSH
10.0000 mL | Freq: Once | INTRAVENOUS | Status: AC
  Administered 2018-04-18: 10 mL via INTRAVENOUS
  Filled 2018-04-18: qty 10

## 2018-04-18 MED ORDER — SODIUM CHLORIDE 0.9 % IV SOLN
1300.0000 mg | Freq: Once | INTRAVENOUS | Status: AC
  Administered 2018-04-18: 1300 mg via INTRAVENOUS
  Filled 2018-04-18: qty 26.3

## 2018-04-18 NOTE — Progress Notes (Signed)
[place]  Telephone:([phone] [phone]   Name: Kristi Thompson Date: 04/18/2018 MRN: [id number]  DOB: [birth year]  Patient Care Team: [name], MD as PCP - General (Internal Medicine) Clent Jacks, RN as Registered Nurse    REASON FOR CONSULTATION: Palliative Care consult requested for this 85 y.o. female with multiple medical problems including asthma, COPD, and GERD, who was recently found to have a pelvic mass with right hydronephrosis and retroperitoneal lymphadenopathy.  Biopsy of the right periadrenal lymph node was consistent with carcinoma of unknown primary but favored urothelial origin.  Patient was treated on atezolizumab.  She was referred to palliative care to help address goals and support her through the treatment process.  SOCIAL HISTORY:    Patient is widowed.  She lives at [facility] in a senior apartment.  She has a daughter who lives in [place] and a son and daughter who lives in [place].  She is originally from [place].  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  ADVANCE DIRECTIVES:  Patient has healthcare power of attorney and living will on file.  Her living will specifies a desire for natural death.  CODE STATUS: DNR  PAST MEDICAL HISTORY: Past Medical History:  Diagnosis Date  . Asthma   . Cancer ([place]) 1978   colon ca  . COPD (chronic obstructive pulmonary disease) ([place])   . GERD (gastroesophageal reflux disease)   . History of hiatal hernia     PAST SURGICAL HISTORY:  Past Surgical History:  Procedure Laterality Date  . ABDOMINAL HYSTERECTOMY    . APPENDECTOMY    . bowel obstruction    . CATARACT EXTRACTION W/ INTRAOCULAR LENS  IMPLANT, BILATERAL    . CHOLECYSTECTOMY    . COLON SURGERY     colon cancer  . COLONOSCOPY WITH PROPOFOL N/A 01/18/2018   Procedure: COLONOSCOPY WITH PROPOFOL;  Surgeon: Jonathon Bellows, MD;  Location: [facility] ENDOSCOPY;  Service: Gastroenterology;  Laterality:  N/A;  . CYSTOSCOPY W/ URETERAL STENT PLACEMENT Right 02/06/2018   Procedure: CYSTOSCOPY WITH RETROGRADE PYELOGRAM;  Surgeon: Hollice Espy, MD;  Location: [facility] ORS;  Service: Urology;  Laterality: Right;  . CYSTOSCOPY WITH URETEROSCOPY Right 02/06/2018   Procedure: CYSTOSCOPY WITH URETEROSCOPY;  Surgeon: Hollice Espy, MD;  Location: [facility] ORS;  Service: Urology;  Laterality: Right;  . PORTA CATH INSERTION N/A 02/11/2018   Procedure: PORTA CATH INSERTION;  Surgeon: Algernon Huxley, MD;  Location: [place] CV LAB;  Service: Cardiovascular;  Laterality: N/A;  . THYROID SURGERY     nodule removed  . THYROIDECTOMY    . TONSILLECTOMY    . URETERAL BIOPSY Right 02/06/2018   Procedure: URETERAL BIOPSY;  Surgeon: Hollice Espy, MD;  Location: [facility] ORS;  Service: Urology;  Laterality: Right;    HEMATOLOGY/ONCOLOGY HISTORY:    Urothelial carcinoma ([place])   02/12/2018 Initial Diagnosis    Urothelial carcinoma ([place])    02/14/2018 - 03/27/2018 Chemotherapy    The patient had atezolizumab (TECENTRIQ) 1,200 mg in sodium chloride 0.9 % 250 mL chemo infusion, 1,200 mg, Intravenous, Once, 2 of 6 cycles Administration: 1,200 mg (02/14/2018), 1,200 mg (03/07/2018)  for chemotherapy treatment.     03/28/2018 -  Chemotherapy    The patient had gemcitabine (GEMZAR) 1,300 mg in sodium chloride 0.9 % 250 mL chemo infusion, 1,292 mg (100 % of original dose 800 mg/m2), Intravenous,  Once, 2 of 4 cycles Dose modification: 800 mg/m2 (original dose 800 mg/m2, Cycle 1, Reason: Patient  Age) Administration: 1,300 mg (03/28/2018), 1,300 mg (04/04/2018), 1,300 mg (04/18/2018)  for chemotherapy treatment.      ALLERGIES:  is allergic to demerol [meperidine] and sulfa antibiotics.  MEDICATIONS:  Current Outpatient Medications  Medication Sig Dispense Refill  . acetaminophen (TYLENOL) 325 MG tablet Take 325 mg by mouth every 6 (six) hours as needed.    Marland Kitchen albuterol (PROVENTIL HFA;VENTOLIN HFA) 108 (90 Base) MCG/ACT  inhaler Inhale into the lungs.    Marland Kitchen aspirin EC 81 MG tablet Take by mouth.    . budesonide (PULMICORT FLEXHALER) 180 MCG/ACT inhaler Inhale into the lungs.    . Cyanocobalamin 2500 MCG SUBL Place under the tongue.    . famotidine (PEPCID) 40 MG tablet Take by mouth.    . fluticasone (FLONASE) 50 MCG/ACT nasal spray USE 2 SPRAYS INTO BOTH     NOSTRILS NIGHTLY    . HYDROcodone-acetaminophen (NORCO/VICODIN) 5-325 MG tablet TK 1 T PO Q 6 H PRN  0  . mirabegron ER (MYRBETRIQ) 25 MG TB24 tablet Take 1 tablet (25 mg total) by mouth daily. 30 tablet 4  . montelukast (SINGULAIR) 10 MG tablet TAKE 1 TABLET DAILY    . Multiple Vitamin (MULTI-VITAMINS) TABS Take by mouth.    . Multiple Vitamins-Minerals (MULTIVITAMIN ADULT PO) Take by mouth.    . ondansetron (ZOFRAN) 8 MG tablet Take 1 tablet (8 mg total) by mouth 2 (two) times daily as needed (Nausea or vomiting). 30 tablet 2  . prochlorperazine (COMPAZINE) 10 MG tablet Take 1 tablet (10 mg total) by mouth every 6 (six) hours as needed (Nausea or vomiting). 60 tablet 2  . ranitidine (ZANTAC) 300 MG tablet Take 1 tablet by mouth 1 day or 1 dose.    . tamsulosin (FLOMAX) 0.4 MG CAPS capsule Take by mouth.     No current facility-administered medications for this visit.     VITAL SIGNS: LMP  (LMP Unknown)  There were no vitals filed for this visit.  Estimated body mass index is 25.24 kg/m as calculated from the following:   Height as of an earlier encounter on 04/18/18: _0  (1.549 m).   Weight as of an earlier encounter on 04/18/18: 133 lb 9.6 oz (60.6 kg).  LABS: CBC:    Component Value Date/Time   WBC 5.8 04/18/2018 0844   HGB 9.0 (L) 04/18/2018 0844   HGB 12.9 04/30/2013 2116   HCT 28.9 (L) 04/18/2018 0844   HCT 39.1 04/30/2013 2116   PLT 375 04/18/2018 0844   PLT 229 04/30/2013 2116   MCV 92.0 04/18/2018 0844   MCV 93 04/30/2013 2116   NEUTROABS 4.0 04/18/2018 0844   LYMPHSABS 0.9 04/18/2018 0844   MONOABS 0.6 04/18/2018 0844   EOSABS  0.2 04/18/2018 0844   BASOSABS 0.1 04/18/2018 0844   Comprehensive Metabolic Panel:    Component Value Date/Time   NA 138 04/18/2018 0844   NA 140 04/30/2013 2116   K 4.0 04/18/2018 0844   K 3.8 04/30/2013 2116   CL 108 04/18/2018 0844   CL 107 04/30/2013 2116   CO2 24 04/18/2018 0844   CO2 29 04/30/2013 2116   BUN 25 (H) 04/18/2018 0844   BUN 25 (H) 04/30/2013 2116   CREATININE 1.36 (H) 04/18/2018 0844   CREATININE 1.01 04/30/2013 2116   GLUCOSE 164 (H) 04/18/2018 0844   GLUCOSE 126 (H) 04/30/2013 2116   CALCIUM 8.7 (L) 04/18/2018 0844   CALCIUM 8.9 04/30/2013 2116   AST 39 04/18/2018 0844   ALT 35  04/18/2018 0844   ALKPHOS 109 04/18/2018 0844   BILITOT 0.3 04/18/2018 0844   PROT 6.2 (L) 04/18/2018 0844   ALBUMIN 3.2 (L) 04/18/2018 0844    RADIOGRAPHIC STUDIES: US Venous Img Lower Unilateral Right  Result Date: 04/11/2018 CLINICAL DATA:  Right lower extremity edema for the past 5-6 days. History of malignancy. Evaluate for DVT. EXAM: RIGHT LOWER EXTREMITY VENOUS DOPPLER ULTRASOUND TECHNIQUE: Gray-scale sonography with graded compression, as well as color Doppler and duplex ultrasound were performed to evaluate the lower extremity deep venous systems from the level of the common femoral vein and including the common femoral, femoral, profunda femoral, popliteal and calf veins including the posterior tibial, peroneal and gastrocnemius veins when visible. The superficial great saphenous vein was also interrogated. Spectral Doppler was utilized to evaluate flow at rest and with distal augmentation maneuvers in the common femoral, femoral and popliteal veins. COMPARISON:  Right lower extremity venous Doppler ultrasound-07/24/2014 FINDINGS: Contralateral Common Femoral Vein: Respiratory phasicity is normal and symmetric with the symptomatic side. No evidence of thrombus. Normal compressibility. Common Femoral Vein: No evidence of thrombus. Normal compressibility, respiratory phasicity and  response to augmentation. Saphenofemoral Junction: No evidence of thrombus. Normal compressibility and flow on color Doppler imaging. Profunda Femoral Vein: No evidence of thrombus. Normal compressibility and flow on color Doppler imaging. Femoral Vein: No evidence of thrombus. Normal compressibility, respiratory phasicity and response to augmentation. Popliteal Vein: No evidence of thrombus. Normal compressibility, respiratory phasicity and response to augmentation. Calf Veins: No evidence of thrombus. Normal compressibility and flow on color Doppler imaging. Superficial Great Saphenous Vein: No evidence of thrombus. Normal compressibility. Venous Reflux:  None. Other Findings:  None. IMPRESSION: No evidence of DVT within the right lower extremity Electronically Signed   By: Sandi Mariscal M.D.   On: 04/11/2018 11:25    PERFORMANCE STATUS (ECOG) : 1 - Symptomatic but completely ambulatory  Review of Systems As noted above. Otherwise, a complete review of systems is negative.  Physical Exam General: NAD, frail appearing, thin Pulmonary: unlabored Extremities: no edema, no joint deformities Skin: no rashes Neurological: Weakness but otherwise nonfocal  IMPRESSION: I met today with patient and daughter today for follow up visit.  She was seen while she was receiving infusion.  Symptomatically, patient says she is doing reasonably well.  She denies severe pain.  She says her appetite is reasonably good.  However, daughter says that patient often has to be encouraged to eat.  We discussed high-protein and high-calorie foods.  Patient has found oral supplements to be intolerable.  I recommended trial of a supplemental pudding. We again talked about referral to RD but patient declined.   Patient says she has been somewhat fatigued and weak.  She still able to ambulate in the home with use of a walker.  Daughter was interested in referral to OT here at the clinic.  Will need to readdress ACP during a  future visit.  PLAN: Treatment plan as outlined per oncology Continue supportive care Norco as needed for pain Bowel regimen for constipation Trial of oral supplements as tolerated Referral to OT ACP during future visit RTC in 3 weeks   Patient expressed understanding and was in agreement with this plan. She also understands that She can call clinic at any time with any questions, concerns, or complaints.    Time Total: 15 minutes  Visit consisted of counseling and education dealing with the complex and emotionally intense issues of symptom management and palliative care in the setting of  serious and potentially life-threatening illness.Greater than 50%  of this time was spent counseling and coordinating care related to the above assessment and plan.  Signed by: Altha Harm, PhD, NP-C [phone] (Work Cell)

## 2018-04-18 NOTE — Progress Notes (Signed)
Patient is here today to follow up on her urothelial carcinoma. Patient stated that she had been feeling good with no complaints.

## 2018-04-19 LAB — THYROID PANEL WITH TSH
Free Thyroxine Index: 1.3 (ref 1.2–4.9)
T3 UPTAKE RATIO: 22 % — AB (ref 24–39)
T4, Total: 5.8 ug/dL (ref 4.5–12.0)
TSH: 5.78 u[IU]/mL — ABNORMAL HIGH (ref 0.450–4.500)

## 2018-04-19 NOTE — Progress Notes (Signed)
[place]  [phone] [phone]) [phone]  ID: Kristi Thompson OB: 1923-07-28  MR#: [id number]  GXQ#:[id number]  Patient Care Team: [name], MD as PCP - General (Internal Medicine) Clent Jacks, RN as Registered Nurse  CHIEF COMPLAINT: Urothelial carcinoma.  INTERVAL HISTORY: Patient returns to clinic today for further evaluation and consideration of cycle 2, day 8 of single agent gemcitabine.  She was seen yesterday in symptom management clinic with neutropenic fevers.  Her fever has defervesced, but patient still complains of weakness and fatigue.  She has no other symptoms.  She does not complain of pain today.  She has no neurologic complaints.  She has no chest pain, shortness of breath, cough, or hemoptysis.  She denies any nausea, vomiting, constipation, or diarrhea.  She denies any melena or hematochezia.  She has no urinary complaints.  Patient offers no further specific complaints today.  REVIEW OF SYSTEMS:   Review of Systems  Constitutional: Positive for fever. Negative for malaise/fatigue and weight loss.  Respiratory: Negative.  Negative for cough, hemoptysis and shortness of breath.   Cardiovascular: Positive for leg swelling. Negative for chest pain.  Gastrointestinal: Negative.  Negative for abdominal pain, blood in stool, constipation, diarrhea, melena, nausea and vomiting.  Genitourinary: Negative.  Negative for dysuria and hematuria.  Musculoskeletal: Negative.  Negative for back pain.  Skin: Negative.  Negative for rash.  Neurological: Negative.  Negative for dizziness, focal weakness, weakness and headaches.  Psychiatric/Behavioral: Negative.  The patient is not nervous/anxious.     As per HPI. Otherwise, a complete review of systems is negative.  PAST MEDICAL HISTORY: Past Medical History:  Diagnosis Date  . Asthma   . Cancer ([place]) 1978   colon ca  . COPD (chronic obstructive pulmonary disease) ([place])   . GERD  (gastroesophageal reflux disease)   . History of hiatal hernia     PAST SURGICAL HISTORY: Past Surgical History:  Procedure Laterality Date  . ABDOMINAL HYSTERECTOMY    . APPENDECTOMY    . bowel obstruction    . CATARACT EXTRACTION W/ INTRAOCULAR LENS  IMPLANT, BILATERAL    . CHOLECYSTECTOMY    . COLON SURGERY     colon cancer  . COLONOSCOPY WITH PROPOFOL N/A 01/18/2018   Procedure: COLONOSCOPY WITH PROPOFOL;  Surgeon: Jonathon Bellows, MD;  Location: [facility] ENDOSCOPY;  Service: Gastroenterology;  Laterality: N/A;  . CYSTOSCOPY W/ URETERAL STENT PLACEMENT Right 02/06/2018   Procedure: CYSTOSCOPY WITH RETROGRADE PYELOGRAM;  Surgeon: Hollice Espy, MD;  Location: [facility] ORS;  Service: Urology;  Laterality: Right;  . CYSTOSCOPY WITH URETEROSCOPY Right 02/06/2018   Procedure: CYSTOSCOPY WITH URETEROSCOPY;  Surgeon: Hollice Espy, MD;  Location: [facility] ORS;  Service: Urology;  Laterality: Right;  . PORTA CATH INSERTION N/A 02/11/2018   Procedure: PORTA CATH INSERTION;  Surgeon: Algernon Huxley, MD;  Location: [place] CV LAB;  Service: Cardiovascular;  Laterality: N/A;  . THYROID SURGERY     nodule removed  . THYROIDECTOMY    . TONSILLECTOMY    . URETERAL BIOPSY Right 02/06/2018   Procedure: URETERAL BIOPSY;  Surgeon: Hollice Espy, MD;  Location: [facility] ORS;  Service: Urology;  Laterality: Right;    FAMILY HISTORY: Negative and noncontributory. No family history on file.  ADVANCED DIRECTIVES (Y/N):  N  HEALTH MAINTENANCE: Social History   Tobacco Use  . Smoking status: Never Smoker  . Smokeless tobacco: Never Used  Substance Use Topics  . Alcohol use: Yes    Comment: rarely  .  Drug use: No     Colonoscopy:  PAP:  Bone density:  Lipid panel:  Allergies  Allergen Reactions  . Demerol [Meperidine] Swelling  . Sulfa Antibiotics Swelling    Current Outpatient Medications  Medication Sig Dispense Refill  . acetaminophen (TYLENOL) 325 MG tablet Take 325 mg by mouth every 6  (six) hours as needed.    Marland Kitchen albuterol (PROVENTIL HFA;VENTOLIN HFA) 108 (90 Base) MCG/ACT inhaler Inhale into the lungs.    Marland Kitchen amoxicillin-clavulanate (AUGMENTIN) 500-125 MG tablet Take 1 tablet (500 mg total) by mouth 2 (two) times daily. 14 tablet 0  . aspirin EC 81 MG tablet Take by mouth.    . budesonide (PULMICORT FLEXHALER) 180 MCG/ACT inhaler Inhale into the lungs.    . Cyanocobalamin 2500 MCG SUBL Place under the tongue.    . famotidine (PEPCID) 40 MG tablet Take by mouth.    . fluticasone (FLONASE) 50 MCG/ACT nasal spray USE 2 SPRAYS INTO BOTH     NOSTRILS NIGHTLY    . HYDROcodone-acetaminophen (NORCO/VICODIN) 5-325 MG tablet TK 1 T PO Q 6 H PRN  0  . mirabegron ER (MYRBETRIQ) 25 MG TB24 tablet Take 1 tablet (25 mg total) by mouth daily. 30 tablet 4  . montelukast (SINGULAIR) 10 MG tablet TAKE 1 TABLET DAILY    . Multiple Vitamin (MULTI-VITAMINS) TABS Take by mouth.    . Multiple Vitamins-Minerals (MULTIVITAMIN ADULT PO) Take by mouth.    . ondansetron (ZOFRAN) 8 MG tablet Take 1 tablet (8 mg total) by mouth 2 (two) times daily as needed (Nausea or vomiting). 30 tablet 2  . predniSONE (STERAPRED UNI-PAK 21 TAB) 10 MG (21) TBPK tablet Take as directed. 21 tablet 0  . prochlorperazine (COMPAZINE) 10 MG tablet Take 1 tablet (10 mg total) by mouth every 6 (six) hours as needed (Nausea or vomiting). 60 tablet 2  . ranitidine (ZANTAC) 300 MG tablet Take 1 tablet by mouth 1 day or 1 dose.    . tamsulosin (FLOMAX) 0.4 MG CAPS capsule Take by mouth.     No current facility-administered medications for this visit.     OBJECTIVE: There were no vitals filed for this visit.   There is no height or weight on file to calculate BMI.    ECOG FS:0 - Asymptomatic  General: Well-developed, well-nourished, no acute distress. Eyes: Pink conjunctiva, anicteric sclera. HEENT: Normocephalic, moist mucous membranes. Lungs: Clear to auscultation bilaterally. Heart: Regular rate and rhythm. No rubs, murmurs,  or gallops. Abdomen: Soft, nontender, nondistended. No organomegaly noted, normoactive bowel sounds. Musculoskeletal: No edema, cyanosis, or clubbing. Neuro: Alert, answering all questions appropriately. Cranial nerves grossly intact. Skin: No rashes or petechiae noted. Psych: Normal affect.  LAB RESULTS:  Lab Results  Component Value Date   NA 134 (L) 04/24/2018   K 3.7 04/24/2018   CL 105 04/24/2018   CO2 23 04/24/2018   GLUCOSE 137 (H) 04/24/2018   BUN 19 04/24/2018   CREATININE 1.36 (H) 04/24/2018   CALCIUM 8.1 (L) 04/24/2018   PROT 6.1 (L) 04/24/2018   ALBUMIN 3.1 (L) 04/24/2018   AST 59 (H) 04/24/2018   ALT 44 04/24/2018   ALKPHOS 96 04/24/2018   BILITOT 0.4 04/24/2018   GFRNONAA 34 (L) 04/24/2018   GFRAA 39 (L) 04/24/2018    Lab Results  Component Value Date   WBC 1.0 (LL) 04/24/2018   NEUTROABS 0.4 (L) 04/24/2018   HGB 8.7 (L) 04/24/2018   HCT 28.2 (L) 04/24/2018   MCV 91.0 04/24/2018  PLT 315 04/24/2018     STUDIES: Dg Chest 2 View  Result Date: 04/24/2018 CLINICAL DATA:  Fever, cough. EXAM: CHEST - 2 VIEW COMPARISON:  Radiographs of February 22, 2015. FINDINGS: Stable cardiomediastinal silhouette. Atherosclerosis of thoracic aorta is noted. Interval placement of right internal jugular Port-A-Cath with distal tip in expected position of the SVC. No pneumothorax or pleural effusion is noted. No acute pulmonary disease is noted. Bony thorax is unremarkable. IMPRESSION: No active cardiopulmonary disease. Aortic Atherosclerosis (ICD10-I70.0). Electronically Signed   By: Marijo Conception, M.D.   On: 04/24/2018 12:00   US Venous Img Lower Unilateral Right  Result Date: 04/11/2018 CLINICAL DATA:  Right lower extremity edema for the past 5-6 days. History of malignancy. Evaluate for DVT. EXAM: RIGHT LOWER EXTREMITY VENOUS DOPPLER ULTRASOUND TECHNIQUE: Gray-scale sonography with graded compression, as well as color Doppler and duplex ultrasound were performed to  evaluate the lower extremity deep venous systems from the level of the common femoral vein and including the common femoral, femoral, profunda femoral, popliteal and calf veins including the posterior tibial, peroneal and gastrocnemius veins when visible. The superficial great saphenous vein was also interrogated. Spectral Doppler was utilized to evaluate flow at rest and with distal augmentation maneuvers in the common femoral, femoral and popliteal veins. COMPARISON:  Right lower extremity venous Doppler ultrasound-07/24/2014 FINDINGS: Contralateral Common Femoral Vein: Respiratory phasicity is normal and symmetric with the symptomatic side. No evidence of thrombus. Normal compressibility. Common Femoral Vein: No evidence of thrombus. Normal compressibility, respiratory phasicity and response to augmentation. Saphenofemoral Junction: No evidence of thrombus. Normal compressibility and flow on color Doppler imaging. Profunda Femoral Vein: No evidence of thrombus. Normal compressibility and flow on color Doppler imaging. Femoral Vein: No evidence of thrombus. Normal compressibility, respiratory phasicity and response to augmentation. Popliteal Vein: No evidence of thrombus. Normal compressibility, respiratory phasicity and response to augmentation. Calf Veins: No evidence of thrombus. Normal compressibility and flow on color Doppler imaging. Superficial Great Saphenous Vein: No evidence of thrombus. Normal compressibility. Venous Reflux:  None. Other Findings:  None. IMPRESSION: No evidence of DVT within the right lower extremity Electronically Signed   By: Sandi Mariscal M.D.   On: 04/11/2018 11:25    ASSESSMENT: Urothelial carcinoma, PDL-1 0%.  PLAN:    1.  Urothelial carcinoma: Case discussed with urology.  Cystoscopy on February 06, 2018 revealed a right ureteral mass.  Cytology report with high-grade urothelial carcinoma consistent with previous biopsy of abdominal mass.  Unfortunately, patient's PDL-1 is  0% therefore Tecentriq may not offer much benefit.  Tecentriq has been discontinued.  Plan was to give gemcitabine on days 1, 8, and 15 with a 22 off.  But given her thrombocytopenia, patient may only be able to tolerate treatment on days 1 and 8.  Given her advanced age and decreased kidney function, hesitant to add cisplatin.  Delay cycle 2, day 8 of gemcitabine today secondary to neutropenic fever.  Return to clinic in 1 week for reconsideration of treatment.  Plan to reimage after cycle 4.   2.  Hydronephrosis: Stent placement was not possible.  Nephrostomy tube was briefly discussed, but patient declined this at this time. 3.  Renal insufficiency: Creatinine remains mildly elevated at 1.36.  Likely secondary to obstruction from tumor.  No nephrostomy tubes at this time.  No cisplatin as above.  Patient will receive 1 L IV fluids with treatment.   4.  Anemia: Hemoglobin continues to slowly trend down is now 8.7.  Iron stores  are also reduced.  Patient will receive 510 mg IV Feraheme with next treatment. 5.  Pain: Resolved. 6.  Lower extremity edema: Ultrasound was negative for DVT.  Monitor. 7.  Neutropenia: Secondary to chemotherapy.  Delay treatment as above. 8.  Fevers: Resolved.  Continue antibiotics as prescribed.  Patient expressed understanding and was in agreement with this plan. She also understands that She can call clinic at any time with any questions, concerns, or complaints.   Cancer Staging No matching staging information was found for the patient.  Lloyd Huger, MD   04/26/2018 11:44 AM

## 2018-04-24 ENCOUNTER — Inpatient Hospital Stay: Payer: Medicare Other | Admitting: Occupational Therapy

## 2018-04-24 ENCOUNTER — Ambulatory Visit
Admission: RE | Admit: 2018-04-24 | Discharge: 2018-04-24 | Disposition: A | Discharge status: Home / Self Care | Payer: Medicare Other | Source: Ambulatory Visit | Attending: Oncology | Admitting: Oncology

## 2018-04-24 ENCOUNTER — Other Ambulatory Visit: Payer: Self-pay

## 2018-04-24 ENCOUNTER — Inpatient Hospital Stay (HOSPITAL_BASED_OUTPATIENT_CLINIC_OR_DEPARTMENT_OTHER): Payer: Medicare Other | Admitting: Oncology

## 2018-04-24 ENCOUNTER — Inpatient Hospital Stay: Payer: Medicare Other

## 2018-04-24 ENCOUNTER — Encounter: Payer: Self-pay | Admitting: Oncology

## 2018-04-24 VITALS — BP 167/72 | HR 86 | Temp 101.0°F | Resp 20

## 2018-04-24 VITALS — Temp 99.0°F

## 2018-04-24 DIAGNOSIS — R509 Fever, unspecified: Secondary | ICD-10-CM

## 2018-04-24 DIAGNOSIS — Z95828 Presence of other vascular implants and grafts: Secondary | ICD-10-CM

## 2018-04-24 DIAGNOSIS — C689 Malignant neoplasm of urinary organ, unspecified: Secondary | ICD-10-CM | POA: Diagnosis not present

## 2018-04-24 DIAGNOSIS — K449 Diaphragmatic hernia without obstruction or gangrene: Secondary | ICD-10-CM

## 2018-04-24 DIAGNOSIS — R05 Cough: Secondary | ICD-10-CM

## 2018-04-24 DIAGNOSIS — D510 Vitamin B12 deficiency anemia due to intrinsic factor deficiency: Secondary | ICD-10-CM

## 2018-04-24 DIAGNOSIS — R531 Weakness: Secondary | ICD-10-CM

## 2018-04-24 DIAGNOSIS — Z85038 Personal history of other malignant neoplasm of large intestine: Secondary | ICD-10-CM

## 2018-04-24 DIAGNOSIS — K219 Gastro-esophageal reflux disease without esophagitis: Secondary | ICD-10-CM

## 2018-04-24 DIAGNOSIS — Z5189 Encounter for other specified aftercare: Secondary | ICD-10-CM

## 2018-04-24 DIAGNOSIS — C779 Secondary and unspecified malignant neoplasm of lymph node, unspecified: Secondary | ICD-10-CM

## 2018-04-24 DIAGNOSIS — C801 Malignant (primary) neoplasm, unspecified: Secondary | ICD-10-CM

## 2018-04-24 DIAGNOSIS — Z79899 Other long term (current) drug therapy: Secondary | ICD-10-CM

## 2018-04-24 DIAGNOSIS — G893 Neoplasm related pain (acute) (chronic): Secondary | ICD-10-CM | POA: Diagnosis not present

## 2018-04-24 DIAGNOSIS — N1330 Unspecified hydronephrosis: Secondary | ICD-10-CM

## 2018-04-24 DIAGNOSIS — R600 Localized edema: Secondary | ICD-10-CM

## 2018-04-24 DIAGNOSIS — Z7982 Long term (current) use of aspirin: Secondary | ICD-10-CM

## 2018-04-24 DIAGNOSIS — Z5111 Encounter for antineoplastic chemotherapy: Secondary | ICD-10-CM | POA: Diagnosis not present

## 2018-04-24 DIAGNOSIS — R059 Cough, unspecified: Secondary | ICD-10-CM

## 2018-04-24 DIAGNOSIS — J449 Chronic obstructive pulmonary disease, unspecified: Secondary | ICD-10-CM

## 2018-04-24 LAB — URINALYSIS, COMPLETE (UACMP) WITH MICROSCOPIC
Bacteria, UA: NONE SEEN
Bilirubin Urine: NEGATIVE
Glucose, UA: NEGATIVE mg/dL
Ketones, ur: NEGATIVE mg/dL
Nitrite: NEGATIVE
Protein, ur: NEGATIVE mg/dL
SPECIFIC GRAVITY, URINE: 1.018 (ref 1.005–1.030)
pH: 5 (ref 5.0–8.0)

## 2018-04-24 LAB — CBC WITH DIFFERENTIAL/PLATELET
Abs Immature Granulocytes: 0.04 10*3/uL (ref 0.00–0.07)
Basophils Absolute: 0 10*3/uL (ref 0.0–0.1)
Basophils Relative: 2 %
Eosinophils Absolute: 0 10*3/uL (ref 0.0–0.5)
Eosinophils Relative: 3 %
HCT: 28.2 % — ABNORMAL LOW (ref 36.0–46.0)
Hemoglobin: 8.7 g/dL — ABNORMAL LOW (ref 12.0–15.0)
IMMATURE GRANULOCYTES: 4 %
LYMPHS PCT: 34 %
Lymphs Abs: 0.4 10*3/uL — ABNORMAL LOW (ref 0.7–4.0)
MCH: 28.1 pg (ref 26.0–34.0)
MCHC: 30.9 g/dL (ref 30.0–36.0)
MCV: 91 fL (ref 80.0–100.0)
Monocytes Absolute: 0.2 10*3/uL (ref 0.1–1.0)
Monocytes Relative: 18 %
Neutro Abs: 0.4 10*3/uL — ABNORMAL LOW (ref 1.7–7.7)
Neutrophils Relative %: 39 %
Platelets: 315 10*3/uL (ref 150–400)
RBC: 3.1 MIL/uL — ABNORMAL LOW (ref 3.87–5.11)
RDW: 14.1 % (ref 11.5–15.5)
WBC: 1 10*3/uL — CL (ref 4.0–10.5)
nRBC: 0 % (ref 0.0–0.2)

## 2018-04-24 LAB — FERRITIN: Ferritin: 563 ng/mL — ABNORMAL HIGH (ref 11–307)

## 2018-04-24 LAB — IRON AND TIBC
Iron: 14 ug/dL — ABNORMAL LOW (ref 28–170)
Saturation Ratios: 6 % — ABNORMAL LOW (ref 10.4–31.8)
TIBC: 227 ug/dL — ABNORMAL LOW (ref 250–450)
UIBC: 213 ug/dL

## 2018-04-24 LAB — COMPREHENSIVE METABOLIC PANEL
ALK PHOS: 96 U/L (ref 38–126)
ALT: 44 U/L (ref 0–44)
AST: 59 U/L — ABNORMAL HIGH (ref 15–41)
Albumin: 3.1 g/dL — ABNORMAL LOW (ref 3.5–5.0)
Anion gap: 6 (ref 5–15)
BUN: 19 mg/dL (ref 8–23)
CO2: 23 mmol/L (ref 22–32)
Calcium: 8.1 mg/dL — ABNORMAL LOW (ref 8.9–10.3)
Chloride: 105 mmol/L (ref 98–111)
Creatinine, Ser: 1.36 mg/dL — ABNORMAL HIGH (ref 0.44–1.00)
GFR calc Af Amer: 39 mL/min — ABNORMAL LOW (ref >60)
GFR calc non Af Amer: 34 mL/min — ABNORMAL LOW (ref >60)
GLUCOSE: 137 mg/dL — AB (ref 70–99)
Potassium: 3.7 mmol/L (ref 3.5–5.1)
Sodium: 134 mmol/L — ABNORMAL LOW (ref 135–145)
Total Bilirubin: 0.4 mg/dL (ref 0.3–1.2)
Total Protein: 6.1 g/dL — ABNORMAL LOW (ref 6.5–8.1)

## 2018-04-24 LAB — INFLUENZA PANEL BY PCR (TYPE A & B)
INFLAPCR: NEGATIVE
Influenza B By PCR: NEGATIVE

## 2018-04-24 MED ORDER — SODIUM CHLORIDE 0.9% FLUSH
10.0000 mL | INTRAVENOUS | Status: DC | PRN
  Administered 2018-04-24: 10 mL via INTRAVENOUS
  Filled 2018-04-24: qty 10

## 2018-04-24 MED ORDER — HEPARIN SOD (PORK) LOCK FLUSH 100 UNIT/ML IV SOLN
500.0000 [IU] | Freq: Once | INTRAVENOUS | Status: AC
  Administered 2018-04-24: 500 [IU] via INTRAVENOUS

## 2018-04-24 MED ORDER — SODIUM CHLORIDE 0.9 % IV SOLN
Freq: Once | INTRAVENOUS | Status: AC
  Administered 2018-04-24: 10:00:00 via INTRAVENOUS
  Filled 2018-04-24: qty 250

## 2018-04-24 MED ORDER — AMOXICILLIN-POT CLAVULANATE 500-125 MG PO TABS: 1.0000 | ORAL_TABLET | Freq: Two times a day (BID) | ORAL | 0 refills | Status: DC

## 2018-04-24 MED ORDER — ACETAMINOPHEN 325 MG PO TABS
ORAL_TABLET | ORAL | Status: AC
  Filled 2018-04-24: qty 2

## 2018-04-24 MED ORDER — ACETAMINOPHEN 325 MG PO TABS
650.0000 mg | ORAL_TABLET | Freq: Once | ORAL | Status: AC
  Administered 2018-04-24: 650 mg via ORAL

## 2018-04-24 MED ORDER — SODIUM CHLORIDE 0.9 % IV SOLN
Freq: Once | INTRAVENOUS | Status: DC
  Filled 2018-04-24: qty 250

## 2018-04-24 MED ORDER — PREDNISONE 10 MG (21) PO TBPK: ORAL_TABLET | ORAL | 0 refills | Status: DC

## 2018-04-24 NOTE — Patient Instructions (Signed)
Provided and review with pt and daughter some energy conservation and fall prevention hand outs As well as provided some balance HEP to incomperate with her walking in the house

## 2018-04-24 NOTE — Therapy (Signed)
[facility] [address] [place], [place], [place], [address] Phone: [phone]   Fax:  [phone]  Occupational Therapy Evaluation  Patient Details  Name: Kristi Thompson MRN: [id number] Date of Birth: 1923-09-07 No data recorded  Encounter Date: 04/24/2018    Past Medical History:  Diagnosis Date  . Asthma   . Cancer ([place]) 1978   colon ca  . COPD (chronic obstructive pulmonary disease) ([place])   . GERD (gastroesophageal reflux disease)   . History of hiatal hernia     Past Surgical History:  Procedure Laterality Date  . ABDOMINAL HYSTERECTOMY    . APPENDECTOMY    . bowel obstruction    . CATARACT EXTRACTION W/ INTRAOCULAR LENS  IMPLANT, BILATERAL    . CHOLECYSTECTOMY    . COLON SURGERY     colon cancer  . COLONOSCOPY WITH PROPOFOL N/A 01/18/2018   Procedure: COLONOSCOPY WITH PROPOFOL;  Surgeon: Jonathon Bellows, MD;  Location: [facility] ENDOSCOPY;  Service: Gastroenterology;  Laterality: N/A;  . CYSTOSCOPY W/ URETERAL STENT PLACEMENT Right 02/06/2018   Procedure: CYSTOSCOPY WITH RETROGRADE PYELOGRAM;  Surgeon: Hollice Espy, MD;  Location: [facility] ORS;  Service: Urology;  Laterality: Right;  . CYSTOSCOPY WITH URETEROSCOPY Right 02/06/2018   Procedure: CYSTOSCOPY WITH URETEROSCOPY;  Surgeon: Hollice Espy, MD;  Location: [facility] ORS;  Service: Urology;  Laterality: Right;  . PORTA CATH INSERTION N/A 02/11/2018   Procedure: PORTA CATH INSERTION;  Surgeon: Algernon Huxley, MD;  Location: [place] CV LAB;  Service: Cardiovascular;  Laterality: N/A;  . THYROID SURGERY     nodule removed  . THYROIDECTOMY    . TONSILLECTOMY    . URETERAL BIOPSY Right 02/06/2018   Procedure: URETERAL BIOPSY;  Surgeon: Hollice Espy, MD;  Location: [facility] ORS;  Service: Urology;  Laterality: Right;    There were no vitals filed for this visit.  Subjective Assessment - 04/24/18 1017    Subjective   I do not want to fall and have hip surgery and I  want to be able to do things for myself - not feeling good today - feel weak and feels like have cold since Monday     Patient Stated Goals  I want to do things for myself    Currently in Pain?  No/denies       Palliative Care  Refer pt for therapy consult . Pt is a  85 y.o. female with multiple medical problems including asthma, COPD, and GERD, who was diagnosis  In November to  have a pelvic mass with right hydronephrosis and retroperitoneal lymphadenopathy.  Biopsy of the right periadrenal lymph node was consistent with carcinoma of unknown primary but favored urothelial origin.  Patient was treated on atezolizumab.  She was referred to palliative care to help address goals and support her through the treatment process.  She was prior to this very independent and living at [facility] senior apartment. At the moment she is staying with her daughter for 2 months in [place]. Pt was walking up and down stairs and timing herself. Also report she was doing yoga.  But now giving out just walking short distance. And since Monday having cold, did not had fever this am but feels like she is getting one.  Vitals in session was 98.7 temp and pulse 86 and O2 sats 96   Did provided pt and daughter and reviewed with them energy conservation , fall prevention handout - as well as provide her with some balance exercises  to incorporate with her walking in the house.  Did not do balance screen or walking test because of pt not feeling good. Did talk to Faythe Casa PA to see pt. While waiting for PA - pt temp did increase and going to be treated for the flu . Pt and daughter can contact me in 2 wks if want to do a balance screen to assess fall risk or her grand daughter is a PT - she can do it too.                     OT Education - 04/24/18 1020    Education Details  energy conservation , and fall prevention / balance HEP -    Person(s) Educated  Patient;Child(ren)    Methods   Handout;Demonstration;Explanation    Comprehension  Verbalized understanding;Returned demonstration                   Patient will benefit from skilled therapeutic intervention in order to improve the following deficits and impairments:     Visit Diagnosis: Encounter for rehabilitation    Problem List Patient Active Problem List   Diagnosis Date Noted  . Urothelial carcinoma ([place]) 02/12/2018  . 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 Asthma 02/01/2018  . Bowel obstruction ([place]) 02/01/2018  . COPD (chronic obstructive pulmonary disease) ([place]) 02/01/2018  . H/O spontaneous bacterial peritonitis 02/01/2018  . Hyperlipidemia 02/01/2018  . Nephrolithiasis 02/01/2018  . Pernicious anemia 02/01/2018  . Thyroid nodule 02/01/2018  . Carcinoma ([place]) 01/26/2018  . HTN, goal below 140/80 12/19/2017  . CKD (chronic kidney disease) stage 3, GFR 30-59 ml/min (H[facility]) 05/23/2017  . BCC (basal cell carcinoma), face 10/11/2015    Rosalyn Gess OTR/L,CLT 04/24/2018, 10:22 AM  [facility] [address] [place], [place], [place], [address] Phone: [phone]   Fax:  [phone]  Name: Kristi Thompson MRN: [id number] Date of Birth: Jun 20, 1923

## 2018-04-24 NOTE — Patient Instructions (Signed)
It was so nice to meet you and your daughter today.  Today you were given 1 liter of IV fluids and 650 mg Tylenol.   You had a fever of 101.1. Normal temperature after taking tylenol.   You had a chest x-ray, had lab work done and we collected a urinalysis to check for a UTI.  Your influenza swab was negative.  So far, we cannot determine where the fever is coming from.  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  This makes you more susceptible to bacteria.  This is why you are being prescribed antibiotics.  I have sent 2 prescriptions to your pharmacy.  They include an antibiotic called Augmentin that you will take twice a day for 7 days as well as prednisone taper.  Today you will take 60 mg of prednisone, tomorrow you will take 50 mg of prednisone, Friday you will take 50 mg of prednisone and so forth.  Keep your appointment for tomorrow to be seen by Dr. Grayland Ormond.  You do not need to have labs drawn in the morning.  Keep a diary of your temperatures.  Take Tylenol up to 1000 mg 3 times a day for fever.  If unrelieved, please call our clinic or report directly to the emergency room.  Faythe Casa, NP 04/24/2018 12:40 PM   Amoxicillin; Clavulanic Acid tablets What is this medicine? AMOXICILLIN; CLAVULANIC ACID (a mox i SIL in; KLAV yoo lan ic AS id) is a penicillin antibiotic. It is used to treat certain kinds of bacterial infections. It will not work for colds, flu, or other viral infections. This medicine may be used for other purposes; ask your health care provider or pharmacist if you have questions. COMMON BRAND NAME(S): Augmentin What should I tell my health care provider before I take this medicine? They need to know if you have any of these conditions: -bowel disease, like colitis -kidney disease -liver disease -mononucleosis -an unusual or allergic reaction to amoxicillin, penicillin, cephalosporin, other  antibiotics, clavulanic acid, other medicines, foods, dyes, or preservatives -pregnant or trying to get pregnant -breast-feeding How should I use this medicine? Take this medicine by mouth with a full glass of water. Follow the directions on the prescription label. Take at the start of a meal. Do not crush or chew. If the tablet has a score line, you may cut it in half at the score line for easier swallowing. Take your medicine at regular intervals. Do not take your medicine more often than directed. Take all of your medicine as directed even if you think you are better. Do not skip doses or stop your medicine early. Talk to your pediatrician regarding the use of this medicine in children. Special care may be needed. Overdosage: If you think you have taken too much of this medicine contact a poison control center or emergency room at once. NOTE: This medicine is only for you. Do not share this medicine with others. What if I miss a dose? If you miss a dose, take it as soon as you can. If it is almost time for your next dose, take only that dose. Do not take double or extra doses. What may interact with this medicine? -allopurinol -anticoagulants -birth control pills -methotrexate -probenecid This list may not describe all possible interactions. Give your health care provider a list of all the medicines, herbs, non-prescription drugs, or dietary supplements you use. Also tell them if you  smoke, drink alcohol, or use illegal drugs. Some items may interact with your medicine. What should I watch for while using this medicine? Tell your doctor or health care professional if your symptoms do not improve. Do not treat diarrhea with over the counter products. Contact your doctor if you have diarrhea that lasts more than 2 days or if it is severe and watery. If you have diabetes, you may get a false-positive result for sugar in your urine. Check with your doctor or health care professional. Birth  control pills may not work properly while you are taking this medicine. Talk to your doctor about using an extra method of birth control. What side effects may I notice from receiving this medicine? Side effects that you should report to your doctor or health care professional as soon as possible: -allergic reactions like skin rash, itching or hives, swelling of the face, lips, or tongue -breathing problems -dark urine -fever or chills, sore throat -redness, blistering, peeling or loosening of the skin, including inside the mouth -seizures -trouble passing urine or change in the amount of urine -unusual bleeding, bruising -unusually weak or tired -white patches or sores in the mouth or throat Side effects that usually do not require medical attention (report to your doctor or health care professional if they continue or are bothersome): -diarrhea -dizziness -headache -nausea, vomiting -stomach upset -vaginal or anal irritation This list may not describe all possible side effects. Call your doctor for medical advice about side effects. You may report side effects to FDA at 1-800-FDA-1088. Where should I keep my medicine? Keep out of the reach of children. Store at room temperature below 25 degrees C (77 degrees F). Keep container tightly closed. Throw away any unused medicine after the expiration date. NOTE: This sheet is a summary. It may not cover all possible information. If you have questions about this medicine, talk to your doctor, pharmacist, or health care provider.  2019 Elsevier/Gold Standard (2007-05-23 12:04:30)  Neutropenic Fever Neutropenic fever is a type of fever that can develop when you have a very low number of a certain kind of white blood cells called neutrophils. This condition is called neutropenia. Neutrophils are made in the spongy tissue inside the bones (bone marrow), and they help the body fight infection. When you have neutropenia, you could be in danger of a  severe infection and neutropenic fever. Neutropenic fever must be treated right away with antibiotic medicines. What are the causes? This condition is caused by damage to the bone marrow or damage to neutrophils after they leave the bone marrow. Causes of neutropenia may include:  Cancer treatments.  Bone marrow cancer.  Cancer of the white blood cells (leukemia or myeloma).  Severe infection.  Bone marrow failure (aplastic anemia).  Many types of medicines.  Conditions that affect the body's disease-fighting system (autoimmune diseases).  Genes that are passed from parent to child (inherited).  Lack (deficiency) of vitamin B.  Enlarged spleen in rheumatoid arthritis (Felty syndrome). What are the signs or symptoms? The main symptom of this condition is fever. Other symptoms include:  Chills.  Fatigue.  Painful mouth ulcers.  Cough.  Shortness of breath.  Swollen glands (lymph nodes).  Sore throat.  Sinus and ear infections.  Gum disease.  Skin infection.  Burning and frequent urination.  Rectal infections.  Vaginal discharge or itching.  Body aches. How is this diagnosed? You may be diagnosed with neutropenic fever if you have a neutrophil count of less than 500 neutrophils  per microliter of blood and a fever of at least 100.66F (38.0C). You may have tests, such as:  Blood tests. These may include: ? A complete blood count (CBC) and a differential white blood count (WBC). ? Peripheral smear. This test involves checking a blood sample under a microscope.  Tests to see whether germs grow from samples of your blood, urine, or other body fluids (cultures). These may be done to check for a source of infection.  Chest X-rays. Your health care provider will also determine whether your neutropenic fever is high risk or low risk.  You may have high-risk neutropenic fever if: ? Your neutrophil count is less than 100 neutrophils per microliter of blood. ? You  have also been diagnosed with pneumonia or another serious medical problem or infection. ? Your condition requires you to be treated in the hospital. ? You are older than 60 years.  You may have low-risk neutropenic fever if: ? Your neutrophil count is more than 100 neutrophils per microliter of blood. ? Your chest X-ray is normal. ? You do not have an active illness or any other problems that require you to be in the hospital. How is this treated? Treatment for this condition may be started as soon as you get diagnosed with neutropenic fever, even if your health care provider is still looking for the source of infection.  You may have to stop taking any medicine that could be causing neutropenic fever.  If you have high-risk neutropenic fever, you will receive one or more antibiotics through an IV in the hospital.  If you have low-risk neutropenic fever, you may be treated at home. Treatment may involve: ? Taking one or two antibiotics by mouth (orally). ? Receiving IV antibiotics that are given by a health care provider who visits your home.  If your health care provider finds a specific cause of infection, you may be switched to antibiotics that work best against those bacteria. If a fungal infection is found, your medicine will be changed to an antifungal medicine.  If your fever goes away in 3-5 days, you may have to take medicine for about 7 days. If your fever does not go away after 3-5 days, you may have to take medicine for a longer period.  If your fever is caused by cancer medicines (chemotherapy), you may need to take a certain medicine (white blood cell growth factors) that helps prevent fever. Follow these instructions at home: Preventing infection   Take steps to prevent infections: ? Avoid contact with sick people. ? Do not eat uncooked or undercooked meats. ? Wash all fruits and vegetables before eating. ? Do not eat or drink unpasteurized dairy products. ? Get  regular dental care, and maintain good dental hygiene. ? Get a flu shot (influenza vaccine). Ask your health care provider whether you need any other vaccines. ? Wear gloves when gardening. ? Wash your hands often with soap and water. If soap and water are not available, use hand sanitizer. General instructions  Take over-the-counter and prescription medicines only as told by your health care provider.  Take your antibiotic medicine as told by your health care provider. Do not stop taking the antibiotic even if you start to feel better.  Drink enough fluid to keep your urine pale yellow. This helps to prevent dehydration.  Keep all follow-up visits as told by your health care provider. This is important. If you are being treated with oral antibiotics at home, you may need to return  to your health care provider every day to have your CBC checked. You may have to do this until your fever gets better. Contact a health care provider if:  You have chills.  You have a fever.  You have signs or symptoms of an infection. Get help right away if:  You have trouble breathing.  You have chest pain.  You feel faint or dizzy.  You faint. Summary  Neutropenic fever is a type of fever that can develop when you have a very low number of white blood cells called neutrophils.  Neutropenic fever must be treated right away with antibiotic medicines.  Causes of this condition include cancers of the blood and bone marrow, as well as medicines to treat cancer (chemotherapy).  Take steps to reduce your risk of infection. These may include avoiding contact with sick people, cooking all meats thoroughly, washing fruits and vegetables before eating, and washing hands often with soap and water. This information is not intended to replace advice given to you by your health care provider. Make sure you discuss any questions you have with your health care provider. Document Released: 03/04/2013 Document  Revised: 06/06/2016 Document Reviewed: 06/06/2016 Elsevier Interactive Patient Education  2019 [employer]  Neutropenia Neutropenia is a condition that occurs when you have a lower-than-normal level of a type of white blood cell (neutrophil) in your body. Neutrophils are made in the spongy center of large bones (bone marrow) and they fight infections. Neutrophils are your body's main defense against bacterial and fungal infections. The fewer neutrophils you have and the longer your body remains without them, the greater your risk of getting a severe infection. What are the causes? This condition can occur if your body uses up or destroys neutrophils faster than your bone marrow can make them. This problem may happen because of:  Bacterial or fungal infection.  Allergic disorders.  Reactions to some medicines.  Autoimmune disease.  An enlarged spleen. This condition can also occur if your bone marrow does not produce enough neutrophils. This problem may be caused by:  Cancer.  Cancer treatments, such as radiation or chemotherapy.  Viral infections.  Medicines, such as phenytoin.  Vitamin B12 deficiency.  Diseases of the bone marrow.  Environmental toxins, such as insecticides. What are the signs or symptoms? This condition does not usually cause symptoms. If symptoms are present, they are usually caused by an underlying infection. Symptoms of an infection may include:  Fever.  Chills.  Swollen glands.  Oral or anal ulcers.  Cough and shortness of breath.  Rash.  Skin infection.  Fatigue. How is this diagnosed? Your health care provider may suspect neutropenia if you have:  A condition that may cause neutropenia.  Symptoms of infection, especially fever.  Frequent and unusual infections. You will have a medical history and physical exam. Tests will also be done, such as:  A complete blood count (CBC).  A procedure to collect a sample of bone marrow for  examination (bone marrow biopsy).  A chest X-ray.  A urine culture.  A blood culture. How is this treated? Treatment depends on the underlying cause and severity of your condition. Mild neutropenia may not require treatment. Treatment may include medicines, such as:  Antibiotic medicine given through an IV tube.  Antiviral medicines.  Antifungal medicines.  A medicine to increase neutrophil production (colony-stimulating factor). You may get this drug through an IV tube or by injection.  Steroids given through an IV tube. If an underlying condition is  causing neutropenia, you may need treatment for that condition. If medicines you are taking are causing neutropenia, your health care provider may have you stop taking those medicines. Follow these instructions at home: Medicines   Take over-the-counter and prescription medicines only as told by your health care provider.  Get a seasonal flu shot (influenza vaccine). Lifestyle  Do not eat unpasteurized foods.Do not eat unwashed raw fruits or vegetables.  Avoid exposure to groups of people or children.  Avoid being around people who are sick.  Avoid being around dirt or dust, such as in construction areas or gardens.  Do not provide direct care for pets. Avoid animal droppings. Do not clean litter boxes and bird cages. Hygiene   Bathe daily.  Clean the area between the genitals and the anus (perineal area) after you urinate or have a bowel movement. If you are female, wipe from front to back.  Brush your teeth with a soft toothbrush before and after meals.  Do not use a razor that has a blade. Use an electric razor to remove hair.  Wash your hands often. Make sure others who come in contact with you also wash their hands. If soap and water are not available, use hand sanitizer. General instructions  Do not have sex unless your health care provider has approved.  Take actions to avoid cuts and . For  example: ? Be cautious when you use knives. Always cut away from yourself. ? Keep knives in protective sheaths or guards when not in use. ? Use oven mitts when you cook with a hot stove, oven, or grill. ? Stand a safe distance away from open fires.  Avoid people who received a vaccine in the past 30 days if that vaccine contained a live version of the germ (live vaccine). You should not get a live vaccine. Common live vaccines are varicella, measles, mumps, and rubella.  Do not share food utensils.  Do not use tampons, enemas, or rectal suppositories unless your health care provider has approved.  Keep all appointments as told by your health care provider. This is important. Contact a health care provider if:  You have a fever.  You have chills or you start to shake.  You have: ? A sore throat. ? A warm, red, or tender area on your skin. ? A cough. ? Frequent or painful urination. ? Vaginal discharge or itching.  You develop: ? Sores in your mouth or anus. ? Swollen lymph nodes. ? Red streaks on the skin. ? A rash.  You feel: ? Nauseous or you vomit. ? Very fatigued. ? Short of breath. This information is not intended to replace advice given to you by your health care provider. Make sure you discuss any questions you have with your health care provider. Document Released: 08/19/2001 Document Revised: 10/24/2017 Document Reviewed: 09/09/2014 Elsevier Interactive Patient Education  2019 [employer].

## 2018-04-24 NOTE — Progress Notes (Signed)
Symptom Management Consult note [facility]  [phone] [phone]  Patient Care Team: [name], MD as PCP - General (Internal Medicine) Clent Jacks, RN as Registered Nurse   Name of the patient: Kristi Thompson  [id number]  08-Oct-1923   Date of visit: 04/24/2018   Diagnosis- 1. Cough - Influenza panel by PCR (type A & B); Future  2. Fever, unspecified - CBC with Differential; Future  3. 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 Pernicious anemia - CBC with Differential; Future - Ferritin; Future - Iron and TIBC; Future    Chief complaint/ Reason for visit- Cough/congestion  Heme/Onc history: Patient last seen by primary oncologist Dr. Grayland Ormond on 04/18/2018 prior to cycle 2 single agent Gemzar.  Day 15 was eliminated from regimen secondary to persistent thrombocytopenia.  She appeared to be doing well.   Unfortunately, her PDL 1 0% with no expression.  No benefit from [place].  Has history of hydronephrosis.  Unable to place stent.  Patient not agreeable to nephrostomy tube.  Interval history- Cough: Patient complains of chills, fever, myalgias, nasal congestion and nonproductive cough.  Tmax 101.1 while in clinic. Symptoms began 2 days ago.  The cough is non-productive, without wheezing, dyspnea or hemoptysis, worsening over time and is aggravated by reclining position Associated symptoms include:chills, fever and postnasal drip. Patient does not have new pets. Patient do have a history of asthma. Denies any neurologic complaints. Denies recent fevers or illnesses. Denies any easy bleeding or bruising. Reports good appetite and denies weight loss. Denies chest pain. Denies any nausea, vomiting, constipation, or diarrhea. Denies urinary  complaints. Patient offers no further specific complaints today.  ECOG FS:1 - Symptomatic but completely ambulatory  Review of systems- Review of Systems  Constitutional: Positive for chills, fever and malaise/fatigue. Negative for weight loss.  HENT: Positive for congestion. Negative for ear pain and tinnitus.   Eyes: Negative.  Negative for blurred vision and double vision.  Respiratory: Positive for cough. Negative for sputum production and shortness of breath.   Cardiovascular: Negative.  Negative for chest pain, palpitations and leg swelling.  Gastrointestinal: Negative.  Negative for abdominal pain, constipation, diarrhea, nausea and vomiting.  Genitourinary: Negative for dysuria, frequency and urgency.  Musculoskeletal: Negative for back pain and falls.  Skin: Negative.  Negative for rash.  Neurological: Negative.  Negative for weakness and headaches.  Endo/Heme/Allergies: Negative.  Does not bruise/bleed easily.  Psychiatric/Behavioral: Negative.  Negative for depression. The patient is not nervous/anxious and does not have insomnia.      Current treatment- s/p Cycle 2 day 1 Gemzar. Last given 04/04/18.  Allergies  Allergen Reactions  . Demerol [Meperidine] Swelling  . Sulfa Antibiotics Swelling     Past Medical History:  Diagnosis Date  . Asthma   . Cancer ([place]) 1978   colon ca  . COPD (chronic obstructive pulmonary disease) ([place])   . GERD (gastroesophageal reflux disease)   . History of hiatal hernia      Past Surgical History:  Procedure Laterality Date  . ABDOMINAL HYSTERECTOMY    . APPENDECTOMY    . bowel obstruction    . CATARACT EXTRACTION W/ INTRAOCULAR LENS  IMPLANT, BILATERAL    . CHOLECYSTECTOMY    . COLON SURGERY     colon cancer  .  COLONOSCOPY WITH PROPOFOL N/A 01/18/2018   Procedure: COLONOSCOPY WITH PROPOFOL;  Surgeon: Jonathon Bellows, MD;  Location: [facility] ENDOSCOPY;  Service: Gastroenterology;  Laterality: N/A;  . CYSTOSCOPY W/ URETERAL STENT  PLACEMENT Right 02/06/2018   Procedure: CYSTOSCOPY WITH RETROGRADE PYELOGRAM;  Surgeon: Hollice Espy, MD;  Location: [facility] ORS;  Service: Urology;  Laterality: Right;  . CYSTOSCOPY WITH URETEROSCOPY Right 02/06/2018   Procedure: CYSTOSCOPY WITH URETEROSCOPY;  Surgeon: Hollice Espy, MD;  Location: [facility] ORS;  Service: Urology;  Laterality: Right;  . PORTA CATH INSERTION N/A 02/11/2018   Procedure: PORTA CATH INSERTION;  Surgeon: Algernon Huxley, MD;  Location: [place] [facility] LAB;  Service: Cardiovascular;  Laterality: N/A;  . THYROID SURGERY     nodule removed  . THYROIDECTOMY    . TONSILLECTOMY    . URETERAL BIOPSY Right 02/06/2018   Procedure: URETERAL BIOPSY;  Surgeon: Hollice Espy, MD;  Location: [facility] ORS;  Service: Urology;  Laterality: Right;    Social History   Socioeconomic History  . Marital status: Married    Spouse name: Not on file  . Number of children: Not on file  . Years of education: Not on file  . Highest education level: Not on file  Occupational History  . Not on file  Social Needs  . Financial resource strain: Not on file  . Food insecurity:    Worry: Not on file    Inability: Not on file  . Transportation needs:    Medical: Not on file    Non-medical: Not on file  Tobacco Use  . Smoking status: Never Smoker  . Smokeless tobacco: Never Used  Substance and Sexual Activity  . Alcohol use: Yes    Comment: rarely  . Drug use: No  . Sexual activity: Not Currently  Lifestyle  . Physical activity:    Days per week: Not on file    Minutes per session: Not on file  . Stress: Not on file  Relationships  . Social connections:    Talks on phone: Not on file    Gets together: Not on file    Attends religious service: Not on file    Active member of club or organization: Not on file    Attends meetings of clubs or organizations: Not on file    Relationship status: Not on file  . Intimate partner violence:    Fear of current or ex partner: Not on file      Emotionally abused: Not on file    Physically abused: Not on file    Forced sexual activity: Not on file  Other Topics Concern  . Not on file  Social History Narrative  . Not on file    History reviewed. No pertinent family history.   Current Outpatient Medications:  .  acetaminophen (TYLENOL) 325 MG tablet, Take 325 mg by mouth every 6 (six) hours as needed., Disp: , Rfl:  .  albuterol (PROVENTIL HFA;VENTOLIN HFA) 108 (90 Base) MCG/ACT inhaler, Inhale into the lungs., Disp: , Rfl:  .  aspirin EC 81 MG tablet, Take by mouth., Disp: , Rfl:  .  budesonide (PULMICORT FLEXHALER) 180 MCG/ACT inhaler, Inhale into the lungs., Disp: , Rfl:  .  Cyanocobalamin 2500 MCG SUBL, Place under the tongue., Disp: , Rfl:  .  famotidine (PEPCID) 40 MG tablet, Take by mouth., Disp: , Rfl:  .  fluticasone (FLONASE) 50 MCG/ACT nasal spray, USE 2 SPRAYS INTO BOTH     NOSTRILS NIGHTLY, Disp: , Rfl:  .  HYDROcodone-acetaminophen (NORCO/VICODIN) 5-325 MG tablet, TK 1 T PO Q 6 H PRN, Disp: , Rfl: 0 .  mirabegron ER (MYRBETRIQ) 25 MG TB24 tablet, Take 1 tablet (25 mg total) by mouth daily., Disp: 30 tablet, Rfl: 4 .  montelukast (SINGULAIR) 10 MG tablet, TAKE 1 TABLET DAILY, Disp: , Rfl:  .  Multiple Vitamin (MULTI-VITAMINS) TABS, Take by mouth., Disp: , Rfl:  .  Multiple Vitamins-Minerals (MULTIVITAMIN ADULT PO), Take by mouth., Disp: , Rfl:  .  ondansetron (ZOFRAN) 8 MG tablet, Take 1 tablet (8 mg total) by mouth 2 (two) times daily as needed (Nausea or vomiting)., Disp: 30 tablet, Rfl: 2 .  prochlorperazine (COMPAZINE) 10 MG tablet, Take 1 tablet (10 mg total) by mouth every 6 (six) hours as needed (Nausea or vomiting)., Disp: 60 tablet, Rfl: 2 .  ranitidine (ZANTAC) 300 MG tablet, Take 1 tablet by mouth 1 day or 1 dose., Disp: , Rfl:  .  tamsulosin (FLOMAX) 0.4 MG CAPS capsule, Take by mouth., Disp: , Rfl:   Current Facility-Administered Medications:  .  0.9 %  sodium chloride infusion, , Intravenous,  Once, Jacquelin Hawking, NP  Physical exam:  Vitals:   04/24/18 0932  BP: (!) 167/72  Pulse: 86  Resp: 20  Temp: (!) 101 F (38.3 C)  TempSrc: Tympanic   Physical Exam Constitutional:      Appearance: Normal appearance.  HENT:     Head: Normocephalic and atraumatic.     Nose: Mucosal edema and congestion present.     Right Turbinates: Swollen.     Left Turbinates: Swollen.  Eyes:     Pupils: Pupils are equal, round, and reactive to light.  Neck:     Musculoskeletal: Normal range of motion.  Cardiovascular:     Rate and Rhythm: Normal rate and regular rhythm.     Heart sounds: Normal heart sounds. No murmur.  Pulmonary:     Effort: Pulmonary effort is normal.     Breath sounds: Decreased breath sounds and rhonchi present. No wheezing.  Abdominal:     General: Bowel sounds are normal. There is no distension.     Palpations: Abdomen is soft.     Tenderness: There is no abdominal tenderness.  Musculoskeletal: Normal range of motion.  Skin:    General: Skin is warm and dry.     Coloration: Skin is pale.     Findings: No rash.  Neurological:     Mental Status: She is alert and oriented to person, place, and time.  Psychiatric:        Judgment: Judgment normal.      CMP Latest Ref Rng & Units 04/18/2018  Glucose 70 - 99 mg/dL 164(H)  BUN 8 - 23 mg/dL 25(H)  Creatinine 0.44 - 1.00 mg/dL 1.36(H)  Sodium 135 - 145 mmol/L 138  Potassium 3.5 - 5.1 mmol/L 4.0  Chloride 98 - 111 mmol/L 108  CO2 22 - 32 mmol/L 24  Calcium 8.9 - 10.3 mg/dL 8.7(L)  Total Protein 6.5 - 8.1 g/dL 6.2(L)  Total Bilirubin 0.3 - 1.2 mg/dL 0.3  Alkaline Phos 38 - 126 U/L 109  AST 15 - 41 U/L 39  ALT 0 - 44 U/L 35   CBC Latest Ref Rng & Units 04/18/2018  WBC 4.0 - 10.5 K/uL 5.8  Hemoglobin 12.0 - 15.0 g/dL 9.0(L)  Hematocrit 36.0 - 46.0 % 28.9(L)  Platelets 150 - 400 K/uL 375    No images are attached to the encounter.  US Venous  Img Lower Unilateral Right  Result Date: 04/11/2018 CLINICAL  DATA:  Right lower extremity edema for the past 5-6 days. History of malignancy. Evaluate for DVT. EXAM: RIGHT LOWER EXTREMITY VENOUS DOPPLER ULTRASOUND TECHNIQUE: Gray-scale sonography with graded compression, as well as color Doppler and duplex ultrasound were performed to evaluate the lower extremity deep venous systems from the level of the common femoral vein and including the common femoral, femoral, profunda femoral, popliteal and calf veins including the posterior tibial, peroneal and gastrocnemius veins when visible. The superficial great saphenous vein was also interrogated. Spectral Doppler was utilized to evaluate flow at rest and with distal augmentation maneuvers in the common femoral, femoral and popliteal veins. COMPARISON:  Right lower extremity venous Doppler ultrasound-07/24/2014 FINDINGS: Contralateral Common Femoral Vein: Respiratory phasicity is normal and symmetric with the symptomatic side. No evidence of thrombus. Normal compressibility. Common Femoral Vein: No evidence of thrombus. Normal compressibility, respiratory phasicity and response to augmentation. Saphenofemoral Junction: No evidence of thrombus. Normal compressibility and flow on color Doppler imaging. Profunda Femoral Vein: No evidence of thrombus. Normal compressibility and flow on color Doppler imaging. Femoral Vein: No evidence of thrombus. Normal compressibility, respiratory phasicity and response to augmentation. Popliteal Vein: No evidence of thrombus. Normal compressibility, respiratory phasicity and response to augmentation. Calf Veins: No evidence of thrombus. Normal compressibility and flow on color Doppler imaging. Superficial Great Saphenous Vein: No evidence of thrombus. Normal compressibility. Venous Reflux:  None. Other Findings:  None. IMPRESSION: No evidence of DVT within the right lower extremity Electronically Signed   By: Sandi Mariscal M.D.   On: 04/11/2018 11:25     Assessment and plan- Patient is a 85 y.o.  female who presents to [facility] for complaints of a cough, congestion and weakness since Monday.   Urothelial carcinoma: Diagnosed in November 2019 during cystoscopy revealing a right ureteral mass.  Cytology revealed high-grade urothelial carcinoma consistent with previous biopsy of abdominal mass.  PDL 1 is 0%.  T[place]ent[place]q stopped.  Plan is for Gemzar on days 1 8 and 15 with day 22 off but unfortunately course complicated by persistent thrombocytopenia.  Will give on days 1 and 8 only with day 15 off.  She is scheduled to return to clinic on 04/25/2018 for assessment prior to cycle 2-day 8 Gemzar.   Cough/congestion: x 2 days. History of COPD.  Did not receive the flu shot this year.  Denies any close contacts with similar illiness/symptoms. Denies fever.  Will get labs with possible chest x-ray.  Check influenza swab.  Fever: T-max 101.1 while in clinic.  Denies fever at home.  Admits to chills. Fever work up as below:  Anemia: Patient is anemic likely d/t chemotherapy  We will add on iron studies and ferritin given patient complains of frequent chills prior to beginning chemotherapy.   Plan: Stat labs (Na 134, creatinine 1.36, AST 59, hgb 8.7, ANC 0.4) Stat influenza swab.  (Negative) Stat UA/urine culture.  (Not convincing; will wait for culture). Stat chest x-ray.  (Negative for acute process). IV fluids.  In clinic administration: Give 1000 ml NaCl. Give 650 mg Tylenol p.o.  New prescriptions/changing prescriptions: RX Augmentin 500mg /125 mg x 7 days.  Rx prednisone tablet pack x 6 days.  Dispo: RTC as scheduled tomorrow to see both palliative care and Dr. Grayland Ormond.  Treatment will be pushed out by 1 week.  She does not need to repeat labs tomorrow.  Dr. Grayland Ormond updated.   Visit Diagnosis 1. Cough   2. Fever, unspecified  3. 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 Pernicious anemia     Patient expressed understanding and was in agreement with  this plan. She also understands that She can call clinic at any time with any questions, concerns, or complaints.   Greater than 50% was spent in counseling and coordination of care with this patient including but not limited to discussion of the relevant topics above (See A&P) including, but not limited to diagnosis and management of acute and chronic medical conditions.   Thank you for allowing me to participate in the care of this very pleasant patient.    Jacquelin Hawking, NP [place] [facility] Cell - [phone] Pager- [phone] 04/24/2018 9:57 AM

## 2018-04-25 ENCOUNTER — Inpatient Hospital Stay: Payer: Medicare Other

## 2018-04-25 ENCOUNTER — Inpatient Hospital Stay (HOSPITAL_BASED_OUTPATIENT_CLINIC_OR_DEPARTMENT_OTHER): Payer: Medicare Other | Admitting: Hospice and Palliative Medicine

## 2018-04-25 ENCOUNTER — Encounter: Payer: Self-pay | Admitting: Hospice and Palliative Medicine

## 2018-04-25 ENCOUNTER — Inpatient Hospital Stay (HOSPITAL_BASED_OUTPATIENT_CLINIC_OR_DEPARTMENT_OTHER): Payer: Medicare Other | Admitting: Oncology

## 2018-04-25 ENCOUNTER — Other Ambulatory Visit: Payer: Self-pay

## 2018-04-25 VITALS — BP 142/70 | HR 76 | Temp 98.7°F | Resp 16 | Wt 134.0 lb

## 2018-04-25 DIAGNOSIS — Z5111 Encounter for antineoplastic chemotherapy: Secondary | ICD-10-CM | POA: Diagnosis not present

## 2018-04-25 DIAGNOSIS — T451X5A Adverse effect of antineoplastic and immunosuppressive drugs, initial encounter: Secondary | ICD-10-CM

## 2018-04-25 DIAGNOSIS — J449 Chronic obstructive pulmonary disease, unspecified: Secondary | ICD-10-CM

## 2018-04-25 DIAGNOSIS — R600 Localized edema: Secondary | ICD-10-CM

## 2018-04-25 DIAGNOSIS — R531 Weakness: Secondary | ICD-10-CM

## 2018-04-25 DIAGNOSIS — Z7982 Long term (current) use of aspirin: Secondary | ICD-10-CM

## 2018-04-25 DIAGNOSIS — Z85038 Personal history of other malignant neoplasm of large intestine: Secondary | ICD-10-CM

## 2018-04-25 DIAGNOSIS — K449 Diaphragmatic hernia without obstruction or gangrene: Secondary | ICD-10-CM

## 2018-04-25 DIAGNOSIS — R509 Fever, unspecified: Secondary | ICD-10-CM

## 2018-04-25 DIAGNOSIS — Z515 Encounter for palliative care: Secondary | ICD-10-CM

## 2018-04-25 DIAGNOSIS — C689 Malignant neoplasm of urinary organ, unspecified: Secondary | ICD-10-CM | POA: Diagnosis not present

## 2018-04-25 DIAGNOSIS — N1330 Unspecified hydronephrosis: Secondary | ICD-10-CM

## 2018-04-25 DIAGNOSIS — K219 Gastro-esophageal reflux disease without esophagitis: Secondary | ICD-10-CM

## 2018-04-25 DIAGNOSIS — D510 Vitamin B12 deficiency anemia due to intrinsic factor deficiency: Secondary | ICD-10-CM

## 2018-04-25 DIAGNOSIS — G893 Neoplasm related pain (acute) (chronic): Secondary | ICD-10-CM

## 2018-04-25 DIAGNOSIS — R05 Cough: Secondary | ICD-10-CM

## 2018-04-25 DIAGNOSIS — Z79899 Other long term (current) drug therapy: Secondary | ICD-10-CM

## 2018-04-25 DIAGNOSIS — D509 Iron deficiency anemia, unspecified: Secondary | ICD-10-CM

## 2018-04-25 DIAGNOSIS — D701 Agranulocytosis secondary to cancer chemotherapy: Secondary | ICD-10-CM | POA: Diagnosis not present

## 2018-04-25 LAB — URINE CULTURE: Culture: 50000 — AB

## 2018-04-25 NOTE — Progress Notes (Signed)
[place]  Telephone:([phone] [phone]   Name: Kristi Thompson Date: 04/25/2018 MRN: [id number]  DOB: 07/24/1923  Patient Care Team: [name], MD as PCP - General (Internal Medicine) Clent Jacks, RN as Registered Nurse    REASON FOR CONSULTATION: Palliative Care consult requested for this 85 y.o. female with multiple medical problems including asthma, COPD, and GERD, who was recently found to have a pelvic mass with right hydronephrosis and retroperitoneal lymphadenopathy.  Biopsy of the right periadrenal lymph node was consistent with carcinoma of unknown primary but favored urothelial origin.  Patient was treated on atezolizumab.  She was referred to palliative care to help address goals and support her through the treatment process.  SOCIAL HISTORY:    Patient is widowed.  She lives at [facility] in a senior apartment.  She has a daughter who lives in [place] and a son and daughter who lives in [place].  She is originally from [place].  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  ADVANCE DIRECTIVES:  Patient has healthcare power of attorney and living will on file.  Her living will specifies a desire for natural death.  CODE STATUS: DNR  PAST MEDICAL HISTORY: Past Medical History:  Diagnosis Date  . Asthma   . Cancer ([place]) 1978   colon ca  . COPD (chronic obstructive pulmonary disease) ([place])   . GERD (gastroesophageal reflux disease)   . History of hiatal hernia     PAST SURGICAL HISTORY:  Past Surgical History:  Procedure Laterality Date  . ABDOMINAL HYSTERECTOMY    . APPENDECTOMY    . bowel obstruction    . CATARACT EXTRACTION W/ INTRAOCULAR LENS  IMPLANT, BILATERAL    . CHOLECYSTECTOMY    . COLON SURGERY     colon cancer  . COLONOSCOPY WITH PROPOFOL N/A 01/18/2018   Procedure: COLONOSCOPY WITH PROPOFOL;  Surgeon: Jonathon Bellows, MD;  Location: [facility] ENDOSCOPY;  Service: Gastroenterology;  Laterality:  N/A;  . CYSTOSCOPY W/ URETERAL STENT PLACEMENT Right 02/06/2018   Procedure: CYSTOSCOPY WITH RETROGRADE PYELOGRAM;  Surgeon: Hollice Espy, MD;  Location: [facility] ORS;  Service: Urology;  Laterality: Right;  . CYSTOSCOPY WITH URETEROSCOPY Right 02/06/2018   Procedure: CYSTOSCOPY WITH URETEROSCOPY;  Surgeon: Hollice Espy, MD;  Location: [facility] ORS;  Service: Urology;  Laterality: Right;  . PORTA CATH INSERTION N/A 02/11/2018   Procedure: PORTA CATH INSERTION;  Surgeon: Algernon Huxley, MD;  Location: [place] CV LAB;  Service: Cardiovascular;  Laterality: N/A;  . THYROID SURGERY     nodule removed  . THYROIDECTOMY    . TONSILLECTOMY    . URETERAL BIOPSY Right 02/06/2018   Procedure: URETERAL BIOPSY;  Surgeon: Hollice Espy, MD;  Location: [facility] ORS;  Service: Urology;  Laterality: Right;    HEMATOLOGY/ONCOLOGY HISTORY:    Urothelial carcinoma ([place])   02/12/2018 Initial Diagnosis    Urothelial carcinoma ([place])    02/14/2018 - 03/27/2018 Chemotherapy    The patient had atezolizumab (TECENTRIQ) 1,200 mg in sodium chloride 0.9 % 250 mL chemo infusion, 1,200 mg, Intravenous, Once, 2 of 6 cycles Administration: 1,200 mg (02/14/2018), 1,200 mg (03/07/2018)  for chemotherapy treatment.     03/28/2018 -  Chemotherapy    The patient had gemcitabine (GEMZAR) 1,300 mg in sodium chloride 0.9 % 250 mL chemo infusion, 1,292 mg (100 % of original dose 800 mg/m2), Intravenous,  Once, 2 of 4 cycles Dose modification: 800 mg/m2 (original dose 800 mg/m2, Cycle 1, Reason: Patient  Age) Administration: 1,300 mg (03/28/2018), 1,300 mg (04/04/2018), 1,300 mg (04/18/2018)  for chemotherapy treatment.      ALLERGIES:  is allergic to demerol [meperidine] and sulfa antibiotics.  MEDICATIONS:  Current Outpatient Medications  Medication Sig Dispense Refill  . acetaminophen (TYLENOL) 325 MG tablet Take 325 mg by mouth every 6 (six) hours as needed.    Marland Kitchen albuterol (PROVENTIL HFA;VENTOLIN HFA) 108 (90 Base) MCG/ACT  inhaler Inhale into the lungs.    Marland Kitchen amoxicillin-clavulanate (AUGMENTIN) 500-125 MG tablet Take 1 tablet (500 mg total) by mouth 2 (two) times daily. 14 tablet 0  . aspirin EC 81 MG tablet Take by mouth.    . budesonide (PULMICORT FLEXHALER) 180 MCG/ACT inhaler Inhale into the lungs.    . Cyanocobalamin 2500 MCG SUBL Place under the tongue.    . famotidine (PEPCID) 40 MG tablet Take by mouth.    . fluticasone (FLONASE) 50 MCG/ACT nasal spray USE 2 SPRAYS INTO BOTH     NOSTRILS NIGHTLY    . HYDROcodone-acetaminophen (NORCO/VICODIN) 5-325 MG tablet TK 1 T PO Q 6 H PRN  0  . mirabegron ER (MYRBETRIQ) 25 MG TB24 tablet Take 1 tablet (25 mg total) by mouth daily. 30 tablet 4  . montelukast (SINGULAIR) 10 MG tablet TAKE 1 TABLET DAILY    . Multiple Vitamin (MULTI-VITAMINS) TABS Take by mouth.    . Multiple Vitamins-Minerals (MULTIVITAMIN ADULT PO) Take by mouth.    . ondansetron (ZOFRAN) 8 MG tablet Take 1 tablet (8 mg total) by mouth 2 (two) times daily as needed (Nausea or vomiting). 30 tablet 2  . predniSONE (STERAPRED UNI-PAK 21 TAB) 10 MG (21) TBPK tablet Take as directed. 21 tablet 0  . prochlorperazine (COMPAZINE) 10 MG tablet Take 1 tablet (10 mg total) by mouth every 6 (six) hours as needed (Nausea or vomiting). 60 tablet 2  . ranitidine (ZANTAC) 300 MG tablet Take 1 tablet by mouth 1 day or 1 dose.    . tamsulosin (FLOMAX) 0.4 MG CAPS capsule Take by mouth.     No current facility-administered medications for this visit.     VITAL SIGNS: BP (!) 142/70 (BP Location: Left Arm, Patient Position: Sitting)   Pulse 76   Temp 98.7 F (37.1 C) (Tympanic)   Resp 16   Wt 134 lb (60.8 kg)   LMP  (LMP Unknown)   BMI 25.32 kg/m  Filed Weights   04/25/18 1022  Weight: 134 lb (60.8 kg)    Estimated body mass index is 25.32 kg/m as calculated from the following:   Height as of 04/18/18: '5\' 1"'  (1.549 m).   Weight as of this encounter: 134 lb (60.8 kg).  LABS: CBC:    Component Value  Date/Time   WBC 1.0 (LL) 04/24/2018 1014   HGB 8.7 (L) 04/24/2018 1014   HGB 12.9 04/30/2013 2116   HCT 28.2 (L) 04/24/2018 1014   HCT 39.1 04/30/2013 2116   PLT 315 04/24/2018 1014   PLT 229 04/30/2013 2116   MCV 91.0 04/24/2018 1014   MCV 93 04/30/2013 2116   NEUTROABS 0.4 (L) 04/24/2018 1014   LYMPHSABS 0.4 (L) 04/24/2018 1014   MONOABS 0.2 04/24/2018 1014   EOSABS 0.0 04/24/2018 1014   BASOSABS 0.0 04/24/2018 1014   Comprehensive Metabolic Panel:    Component Value Date/Time   NA 134 (L) 04/24/2018 1014   NA 140 04/30/2013 2116   K 3.7 04/24/2018 1014   K 3.8 04/30/2013 2116   CL 105 04/24/2018 1014  CL 107 04/30/2013 2116   CO2 23 04/24/2018 1014   CO2 29 04/30/2013 2116   BUN 19 04/24/2018 1014   BUN 25 (H) 04/30/2013 2116   CREATININE 1.36 (H) 04/24/2018 1014   CREATININE 1.01 04/30/2013 2116   GLUCOSE 137 (H) 04/24/2018 1014   GLUCOSE 126 (H) 04/30/2013 2116   CALCIUM 8.1 (L) 04/24/2018 1014   CALCIUM 8.9 04/30/2013 2116   AST 59 (H) 04/24/2018 1014   ALT 44 04/24/2018 1014   ALKPHOS 96 04/24/2018 1014   BILITOT 0.4 04/24/2018 1014   PROT 6.1 (L) 04/24/2018 1014   ALBUMIN 3.1 (L) 04/24/2018 1014    RADIOGRAPHIC STUDIES: Dg Chest 2 View  Result Date: 04/24/2018 CLINICAL DATA:  Fever, cough. EXAM: CHEST - 2 VIEW COMPARISON:  Radiographs of February 22, 2015. FINDINGS: Stable cardiomediastinal silhouette. Atherosclerosis of thoracic aorta is noted. Interval placement of right internal jugular Port-A-Cath with distal tip in expected position of the SVC. No pneumothorax or pleural effusion is noted. No acute pulmonary disease is noted. Bony thorax is unremarkable. IMPRESSION: No active cardiopulmonary disease. Aortic Atherosclerosis (ICD10-I70.0). Electronically Signed   By: Marijo Conception, M.D.   On: 04/24/2018 12:00   US Venous Img Lower Unilateral Right  Result Date: 04/11/2018 CLINICAL DATA:  Right lower extremity edema for the past 5-6 days. History of  malignancy. Evaluate for DVT. EXAM: RIGHT LOWER EXTREMITY VENOUS DOPPLER ULTRASOUND TECHNIQUE: Gray-scale sonography with graded compression, as well as color Doppler and duplex ultrasound were performed to evaluate the lower extremity deep venous systems from the level of the common femoral vein and including the common femoral, femoral, profunda femoral, popliteal and calf veins including the posterior tibial, peroneal and gastrocnemius veins when visible. The superficial great saphenous vein was also interrogated. Spectral Doppler was utilized to evaluate flow at rest and with distal augmentation maneuvers in the common femoral, femoral and popliteal veins. COMPARISON:  Right lower extremity venous Doppler ultrasound-07/24/2014 FINDINGS: Contralateral Common Femoral Vein: Respiratory phasicity is normal and symmetric with the symptomatic side. No evidence of thrombus. Normal compressibility. Common Femoral Vein: No evidence of thrombus. Normal compressibility, respiratory phasicity and response to augmentation. Saphenofemoral Junction: No evidence of thrombus. Normal compressibility and flow on color Doppler imaging. Profunda Femoral Vein: No evidence of thrombus. Normal compressibility and flow on color Doppler imaging. Femoral Vein: No evidence of thrombus. Normal compressibility, respiratory phasicity and response to augmentation. Popliteal Vein: No evidence of thrombus. Normal compressibility, respiratory phasicity and response to augmentation. Calf Veins: No evidence of thrombus. Normal compressibility and flow on color Doppler imaging. Superficial Great Saphenous Vein: No evidence of thrombus. Normal compressibility. Venous Reflux:  None. Other Findings:  None. IMPRESSION: No evidence of DVT within the right lower extremity Electronically Signed   By: Sandi Mariscal M.D.   On: 04/11/2018 11:25    PERFORMANCE STATUS (ECOG) : 1 - Symptomatic but completely ambulatory  Review of Systems As noted above.  Otherwise, a complete review of systems is negative.  Physical Exam General: NAD, frail appearing, thin Pulmonary: unlabored, CTA ant/post fields, no cough noted Cards: RRR ABD: soft, nttp, BS + Extremities: trace edema BLEs Skin: no rashes Neurological: Weakness but otherwise nonfocal  IMPRESSION: I met today with patient and daughter today for follow up visit.   Patient was seen in the [facility] yesterday for neutropenic fevers. She feels she defervesced overnight.  She reports feeling much improved today on steroids and antibiotics.  Patient denies any distressing symptoms this morning other than fatigue.  She has no pain, shortness of breath, wheezing, nausea vomiting or diarrhea.  Labs and chest x-ray reviewed from yesterday's work-up.  Encouraged p.o. intake and fluids.  Patient reports persistent fatigue and met with OT this week.  I have discussed the case with OT.  Patient has some exercises she intends to start at home upon resolution of her current symptoms.  I am interested to see if course of steroids help improve fatigue.  Trial of a stimulant could also be considered although risk of appetite suppression is significant.  Patient and daughter both seem to be coping well.  All questions and concerns were addressed today.  Patient will also see Dr. Grayland Ormond today.   PLAN: Treatment plan as outlined per oncology Continue supportive care Plan to complete MOST form during future visit RTC in 3 weeks   Patient expressed understanding and was in agreement with this plan. She also understands that She can call clinic at any time with any questions, concerns, or complaints.    Time Total: 15 minutes  Visit consisted of counseling and education dealing with the complex and emotionally intense issues of symptom management and palliative care in the setting of serious and potentially life-threatening illness.Greater than 50%  of this time was spent counseling and coordinating care  related to the above assessment and plan.  Signed by: Altha Harm, PhD, NP-C [phone] (Work Cell)

## 2018-04-26 DIAGNOSIS — D509 Iron deficiency anemia, unspecified: Secondary | ICD-10-CM | POA: Insufficient documentation

## 2018-04-28 NOTE — Progress Notes (Signed)
[place]  [phone] Fax:(336) [phone]  ID: Kristi Thompson OB: 1923/09/03  MR#: [id number]  YZJ#:[id number]  Patient Care Team: [name], MD as PCP - General (Internal Medicine) Clent Jacks, RN as Registered Nurse  CHIEF COMPLAINT: Urothelial carcinoma.  INTERVAL HISTORY: Patient returns to clinic today for further evaluation and reconsideration of cycle 2, day 8 of gemcitabine only. She had one episode of hematuria, but otherwise feels well.  She has had no further fevers. She does not complain of weakness or fatigue today.  She denies any pain. She has no neurologic complaints.  She has no chest pain, shortness of breath, cough, or hemoptysis.  She denies any nausea, vomiting, constipation, or diarrhea.  She denies any melena or hematochezia.  She has no other urinary complaints.  Patient offer no further specific complaints today.  REVIEW OF SYSTEMS:   Review of Systems  Constitutional: Negative.  Negative for fever, malaise/fatigue and weight loss.  Respiratory: Negative.  Negative for cough, hemoptysis and shortness of breath.   Cardiovascular: Negative.  Negative for chest pain and leg swelling.  Gastrointestinal: Negative.  Negative for abdominal pain, blood in stool, constipation, diarrhea, melena, nausea and vomiting.  Genitourinary: Positive for hematuria. Negative for dysuria.  Musculoskeletal: Negative.  Negative for back pain.  Skin: Negative.  Negative for rash.  Neurological: Negative.  Negative for dizziness, focal weakness, weakness and headaches.  Psychiatric/Behavioral: Negative.  The patient is not nervous/anxious.     As per HPI. Otherwise, a complete review of systems is negative.  PAST MEDICAL HISTORY: Past Medical History:  Diagnosis Date  . Asthma   . Cancer ([place]) 1978   colon ca  . COPD (chronic obstructive pulmonary disease) ([place])   . GERD (gastroesophageal reflux disease)   . History of hiatal hernia      PAST SURGICAL HISTORY: Past Surgical History:  Procedure Laterality Date  . ABDOMINAL HYSTERECTOMY    . APPENDECTOMY    . bowel obstruction    . CATARACT EXTRACTION W/ INTRAOCULAR LENS  IMPLANT, BILATERAL    . CHOLECYSTECTOMY    . COLON SURGERY     colon cancer  . COLONOSCOPY WITH PROPOFOL N/A 01/18/2018   Procedure: COLONOSCOPY WITH PROPOFOL;  Surgeon: Jonathon Bellows, MD;  Location: [facility] ENDOSCOPY;  Service: Gastroenterology;  Laterality: N/A;  . CYSTOSCOPY W/ URETERAL STENT PLACEMENT Right 02/06/2018   Procedure: CYSTOSCOPY WITH RETROGRADE PYELOGRAM;  Surgeon: Hollice Espy, MD;  Location: [facility] ORS;  Service: Urology;  Laterality: Right;  . CYSTOSCOPY WITH URETEROSCOPY Right 02/06/2018   Procedure: CYSTOSCOPY WITH URETEROSCOPY;  Surgeon: Hollice Espy, MD;  Location: [facility] ORS;  Service: Urology;  Laterality: Right;  . PORTA CATH INSERTION N/A 02/11/2018   Procedure: PORTA CATH INSERTION;  Surgeon: Algernon Huxley, MD;  Location: [place] CV LAB;  Service: Cardiovascular;  Laterality: N/A;  . THYROID SURGERY     nodule removed  . THYROIDECTOMY    . TONSILLECTOMY    . URETERAL BIOPSY Right 02/06/2018   Procedure: URETERAL BIOPSY;  Surgeon: Hollice Espy, MD;  Location: [facility] ORS;  Service: Urology;  Laterality: Right;    FAMILY HISTORY: Negative and noncontributory. No family history on file.  ADVANCED DIRECTIVES (Y/N):  N  HEALTH MAINTENANCE: Social History   Tobacco Use  . Smoking status: Never Smoker  . Smokeless tobacco: Never Used  Substance Use Topics  . Alcohol use: Yes    Comment: rarely  . Drug use: No     Colonoscopy:  PAP:  Bone density:  Lipid panel:  Allergies  Allergen Reactions  . Demerol [Meperidine] Swelling  . Sulfa Antibiotics Swelling    Current Outpatient Medications  Medication Sig Dispense Refill  . acetaminophen (TYLENOL) 325 MG tablet Take 325 mg by mouth every 6 (six) hours as needed.    Marland Kitchen albuterol (PROVENTIL HFA;VENTOLIN  HFA) 108 (90 Base) MCG/ACT inhaler Inhale into the lungs.    Marland Kitchen amoxicillin-clavulanate (AUGMENTIN) 500-125 MG tablet Take 1 tablet (500 mg total) by mouth 2 (two) times daily. 14 tablet 0  . aspirin EC 81 MG tablet Take by mouth.    . budesonide (PULMICORT FLEXHALER) 180 MCG/ACT inhaler Inhale into the lungs.    . Cyanocobalamin 2500 MCG SUBL Place under the tongue.    . famotidine (PEPCID) 40 MG tablet Take by mouth.    . fluticasone (FLONASE) 50 MCG/ACT nasal spray USE 2 SPRAYS INTO BOTH     NOSTRILS NIGHTLY    . HYDROcodone-acetaminophen (NORCO/VICODIN) 5-325 MG tablet TK 1 T PO Q 6 H PRN  0  . mirabegron ER (MYRBETRIQ) 25 MG TB24 tablet Take 1 tablet (25 mg total) by mouth daily. 30 tablet 4  . montelukast (SINGULAIR) 10 MG tablet TAKE 1 TABLET DAILY    . Multiple Vitamin (MULTI-VITAMINS) TABS Take by mouth.    . Multiple Vitamins-Minerals (MULTIVITAMIN ADULT PO) Take by mouth.    . ondansetron (ZOFRAN) 8 MG tablet Take 1 tablet (8 mg total) by mouth 2 (two) times daily as needed (Nausea or vomiting). 30 tablet 2  . predniSONE (STERAPRED UNI-PAK 21 TAB) 10 MG (21) TBPK tablet Take as directed. 21 tablet 0  . prochlorperazine (COMPAZINE) 10 MG tablet Take 1 tablet (10 mg total) by mouth every 6 (six) hours as needed (Nausea or vomiting). 60 tablet 2  . ranitidine (ZANTAC) 300 MG tablet Take 1 tablet by mouth 1 day or 1 dose.    . tamsulosin (FLOMAX) 0.4 MG CAPS capsule Take by mouth.     No current facility-administered medications for this visit.    Facility-Administered Medications Ordered in Other Visits  Medication Dose Route Frequency Provider Last Rate Last Dose  . heparin lock flush 100 unit/mL  500 Units Intravenous Once Lloyd Huger, MD        OBJECTIVE: Vitals:   05/02/18 0839  BP: 137/72  Pulse: 76  Temp: 97.8 F (36.6 C)     Body mass index is 25.13 kg/m.    ECOG FS:0 - Asymptomatic  General: Well-developed, well-nourished, no acute distress. Eyes: Pink  conjunctiva, anicteric sclera. HEENT: Normocephalic, moist mucous membranes. Lungs: Clear to auscultation bilaterally. Heart: Regular rate and rhythm. No rubs, murmurs, or gallops. Abdomen: Soft, nontender, nondistended. No organomegaly noted, normoactive bowel sounds. Musculoskeletal: No edema, cyanosis, or clubbing. Neuro: Alert, answering all questions appropriately. Cranial nerves grossly intact. Skin: No rashes or petechiae noted. Psych: Normal affect.  LAB RESULTS:  Lab Results  Component Value Date   NA 137 05/02/2018   K 4.4 05/02/2018   CL 107 05/02/2018   CO2 22 05/02/2018   GLUCOSE 145 (H) 05/02/2018   BUN 38 (H) 05/02/2018   CREATININE 1.36 (H) 05/02/2018   CALCIUM 8.0 (L) 05/02/2018   PROT 5.9 (L) 05/02/2018   ALBUMIN 3.1 (L) 05/02/2018   AST 27 05/02/2018   ALT 34 05/02/2018   ALKPHOS 82 05/02/2018   BILITOT 0.4 05/02/2018   GFRNONAA 34 (L) 05/02/2018   GFRAA 39 (L) 05/02/2018    Lab Results  Component Value Date   WBC 13.9 (H) 05/02/2018   NEUTROABS PENDING 05/02/2018   HGB 10.1 (L) 05/02/2018   HCT 31.2 (L) 05/02/2018   MCV 91.2 05/02/2018   PLT 210 05/02/2018     STUDIES: Dg Chest 2 View  Result Date: 04/24/2018 CLINICAL DATA:  Fever, cough. EXAM: CHEST - 2 VIEW COMPARISON:  Radiographs of February 22, 2015. FINDINGS: Stable cardiomediastinal silhouette. Atherosclerosis of thoracic aorta is noted. Interval placement of right internal jugular Port-A-Cath with distal tip in expected position of the SVC. No pneumothorax or pleural effusion is noted. No acute pulmonary disease is noted. Bony thorax is unremarkable. IMPRESSION: No active cardiopulmonary disease. Aortic Atherosclerosis (ICD10-I70.0). Electronically Signed   By: Marijo Conception, M.D.   On: 04/24/2018 12:00   US Venous Img Lower Unilateral Right  Result Date: 04/11/2018 CLINICAL DATA:  Right lower extremity edema for the past 5-6 days. History of malignancy. Evaluate for DVT. EXAM: RIGHT  LOWER EXTREMITY VENOUS DOPPLER ULTRASOUND TECHNIQUE: Gray-scale sonography with graded compression, as well as color Doppler and duplex ultrasound were performed to evaluate the lower extremity deep venous systems from the level of the common femoral vein and including the common femoral, femoral, profunda femoral, popliteal and calf veins including the posterior tibial, peroneal and gastrocnemius veins when visible. The superficial great saphenous vein was also interrogated. Spectral Doppler was utilized to evaluate flow at rest and with distal augmentation maneuvers in the common femoral, femoral and popliteal veins. COMPARISON:  Right lower extremity venous Doppler ultrasound-07/24/2014 FINDINGS: Contralateral Common Femoral Vein: Respiratory phasicity is normal and symmetric with the symptomatic side. No evidence of thrombus. Normal compressibility. Common Femoral Vein: No evidence of thrombus. Normal compressibility, respiratory phasicity and response to augmentation. Saphenofemoral Junction: No evidence of thrombus. Normal compressibility and flow on color Doppler imaging. Profunda Femoral Vein: No evidence of thrombus. Normal compressibility and flow on color Doppler imaging. Femoral Vein: No evidence of thrombus. Normal compressibility, respiratory phasicity and response to augmentation. Popliteal Vein: No evidence of thrombus. Normal compressibility, respiratory phasicity and response to augmentation. Calf Veins: No evidence of thrombus. Normal compressibility and flow on color Doppler imaging. Superficial Great Saphenous Vein: No evidence of thrombus. Normal compressibility. Venous Reflux:  None. Other Findings:  None. IMPRESSION: No evidence of DVT within the right lower extremity Electronically Signed   By: Sandi Mariscal M.D.   On: 04/11/2018 11:25    ASSESSMENT: Urothelial carcinoma, PDL-1 0%.  PLAN:    1.  Urothelial carcinoma: Case discussed with urology.  Cystoscopy on February 06, 2018 revealed  a right ureteral mass.  Cytology report with high-grade urothelial carcinoma consistent with previous biopsy of abdominal mass.  Unfortunately, patient's PDL-1 is 0% therefore Tecentriq may not offer much benefit.  Tecentriq has been discontinued.  Initial plan was to give gemcitabine on days 1, 8, and 15 with a 22 off, but given problems with thrombocytopenia and neutropenia she may only be able to tolerate treatment every other week.  Given her advanced age and decreased kidney function, hesitant to add cisplatin.  Proceed with cycle 2, day 8 of gemcitabine today.  Return to clinic in 1 week for lab only and then in two weeks for further evaluation and consideration of cycle 3, day 1.  Plan to reimage after cycle 4.   2.  Hydronephrosis: Stent placement was not possible.  Nephrostomy tube was briefly discussed, but patient declined this at this time. 3.  Renal insufficiency: Creatinine remains mildly elevated but stable at  1.36.  Likely secondary to obstruction from tumor.  No nephrostomy tubes at this time.  No cisplatin as above.  Continue 1 L IV fluids with treatments.   4.  Anemia: Hemoglobin improved to 10.1.  Iron stores are also reduced, therefore she will receive 510 mg IV Feraheme today and with next treatment. 5.  Pain: Resolved. 6.  Lower extremity edema: Ultrasound was negative for DVT.  Monitor. 7.  Neutropenia: Resolved. 8.  Fevers: Resolved.  9.  Hematuria: Only occurred once, monitor.   Patient expressed understanding and was in agreement with this plan. She also understands that She can call clinic at any time with any questions, concerns, or complaints.   Cancer Staging No matching staging information was found for the patient.  Lloyd Huger, MD   05/02/2018 9:26 AM

## 2018-05-02 ENCOUNTER — Other Ambulatory Visit: Payer: Self-pay

## 2018-05-02 ENCOUNTER — Ambulatory Visit: Payer: Medicare Other | Admitting: Hospice and Palliative Medicine

## 2018-05-02 ENCOUNTER — Inpatient Hospital Stay (HOSPITAL_BASED_OUTPATIENT_CLINIC_OR_DEPARTMENT_OTHER): Payer: Medicare Other | Admitting: Oncology

## 2018-05-02 ENCOUNTER — Inpatient Hospital Stay: Payer: Medicare Other

## 2018-05-02 VITALS — BP 137/72 | HR 76 | Temp 97.8°F | Ht 61.0 in | Wt 133.0 lb

## 2018-05-02 DIAGNOSIS — Z5111 Encounter for antineoplastic chemotherapy: Secondary | ICD-10-CM | POA: Diagnosis not present

## 2018-05-02 DIAGNOSIS — D509 Iron deficiency anemia, unspecified: Secondary | ICD-10-CM

## 2018-05-02 DIAGNOSIS — G893 Neoplasm related pain (acute) (chronic): Secondary | ICD-10-CM | POA: Diagnosis not present

## 2018-05-02 DIAGNOSIS — Z79899 Other long term (current) drug therapy: Secondary | ICD-10-CM

## 2018-05-02 DIAGNOSIS — K449 Diaphragmatic hernia without obstruction or gangrene: Secondary | ICD-10-CM

## 2018-05-02 DIAGNOSIS — R531 Weakness: Secondary | ICD-10-CM

## 2018-05-02 DIAGNOSIS — Z7982 Long term (current) use of aspirin: Secondary | ICD-10-CM

## 2018-05-02 DIAGNOSIS — Z85038 Personal history of other malignant neoplasm of large intestine: Secondary | ICD-10-CM

## 2018-05-02 DIAGNOSIS — C689 Malignant neoplasm of urinary organ, unspecified: Secondary | ICD-10-CM

## 2018-05-02 DIAGNOSIS — R600 Localized edema: Secondary | ICD-10-CM

## 2018-05-02 DIAGNOSIS — C679 Malignant neoplasm of bladder, unspecified: Secondary | ICD-10-CM

## 2018-05-02 DIAGNOSIS — D510 Vitamin B12 deficiency anemia due to intrinsic factor deficiency: Secondary | ICD-10-CM

## 2018-05-02 DIAGNOSIS — K219 Gastro-esophageal reflux disease without esophagitis: Secondary | ICD-10-CM

## 2018-05-02 DIAGNOSIS — J449 Chronic obstructive pulmonary disease, unspecified: Secondary | ICD-10-CM

## 2018-05-02 DIAGNOSIS — N1330 Unspecified hydronephrosis: Secondary | ICD-10-CM

## 2018-05-02 LAB — CBC WITH DIFFERENTIAL/PLATELET
Abs Immature Granulocytes: 0.91 10*3/uL — ABNORMAL HIGH (ref 0.00–0.07)
BASOS ABS: 0.1 10*3/uL (ref 0.0–0.1)
Basophils Relative: 0 %
EOS PCT: 2 %
Eosinophils Absolute: 0.3 10*3/uL (ref 0.0–0.5)
HCT: 31.2 % — ABNORMAL LOW (ref 36.0–46.0)
Hemoglobin: 10.1 g/dL — ABNORMAL LOW (ref 12.0–15.0)
Immature Granulocytes: 7 %
Lymphocytes Relative: 16 %
Lymphs Abs: 2.2 10*3/uL (ref 0.7–4.0)
MCH: 29.5 pg (ref 26.0–34.0)
MCHC: 32.4 g/dL (ref 30.0–36.0)
MCV: 91.2 fL (ref 80.0–100.0)
Monocytes Absolute: 1.5 10*3/uL — ABNORMAL HIGH (ref 0.1–1.0)
Monocytes Relative: 11 %
Neutro Abs: 8.9 10*3/uL — ABNORMAL HIGH (ref 1.7–7.7)
Neutrophils Relative %: 64 %
PLATELETS: 210 10*3/uL (ref 150–400)
RBC: 3.42 MIL/uL — ABNORMAL LOW (ref 3.87–5.11)
RDW: 15.9 % — ABNORMAL HIGH (ref 11.5–15.5)
WBC: 13.9 10*3/uL — ABNORMAL HIGH (ref 4.0–10.5)
nRBC: 0 % (ref 0.0–0.2)

## 2018-05-02 LAB — COMPREHENSIVE METABOLIC PANEL
ALT: 34 U/L (ref 0–44)
AST: 27 U/L (ref 15–41)
Albumin: 3.1 g/dL — ABNORMAL LOW (ref 3.5–5.0)
Alkaline Phosphatase: 82 U/L (ref 38–126)
Anion gap: 8 (ref 5–15)
BUN: 38 mg/dL — ABNORMAL HIGH (ref 8–23)
CO2: 22 mmol/L (ref 22–32)
Calcium: 8 mg/dL — ABNORMAL LOW (ref 8.9–10.3)
Chloride: 107 mmol/L (ref 98–111)
Creatinine, Ser: 1.36 mg/dL — ABNORMAL HIGH (ref 0.44–1.00)
GFR calc Af Amer: 39 mL/min — ABNORMAL LOW (ref >60)
GFR calc non Af Amer: 34 mL/min — ABNORMAL LOW (ref >60)
Glucose, Bld: 145 mg/dL — ABNORMAL HIGH (ref 70–99)
Potassium: 4.4 mmol/L (ref 3.5–5.1)
Sodium: 137 mmol/L (ref 135–145)
Total Bilirubin: 0.4 mg/dL (ref 0.3–1.2)
Total Protein: 5.9 g/dL — ABNORMAL LOW (ref 6.5–8.1)

## 2018-05-02 LAB — PATHOLOGIST SMEAR REVIEW

## 2018-05-02 MED ORDER — SODIUM CHLORIDE 0.9 % IV SOLN
Freq: Once | INTRAVENOUS | Status: AC
  Administered 2018-05-02: 10:00:00 via INTRAVENOUS
  Filled 2018-05-02: qty 250

## 2018-05-02 MED ORDER — PROCHLORPERAZINE MALEATE 10 MG PO TABS
10.0000 mg | ORAL_TABLET | Freq: Once | ORAL | Status: AC
  Administered 2018-05-02: 10 mg via ORAL
  Filled 2018-05-02: qty 1

## 2018-05-02 MED ORDER — HEPARIN SOD (PORK) LOCK FLUSH 100 UNIT/ML IV SOLN
500.0000 [IU] | Freq: Once | INTRAVENOUS | Status: AC | PRN
  Administered 2018-05-02: 500 [IU]
  Filled 2018-05-02: qty 5

## 2018-05-02 MED ORDER — SODIUM CHLORIDE 0.9 % IV SOLN
510.0000 mg | Freq: Once | INTRAVENOUS | Status: AC
  Administered 2018-05-02: 510 mg via INTRAVENOUS
  Filled 2018-05-02: qty 17

## 2018-05-02 MED ORDER — SODIUM CHLORIDE 0.9% FLUSH
10.0000 mL | Freq: Once | INTRAVENOUS | Status: AC
  Administered 2018-05-02: 10 mL via INTRAVENOUS
  Filled 2018-05-02: qty 10

## 2018-05-02 MED ORDER — SODIUM CHLORIDE 0.9 % IV SOLN
1300.0000 mg | Freq: Once | INTRAVENOUS | Status: AC
  Administered 2018-05-02: 1300 mg via INTRAVENOUS
  Filled 2018-05-02: qty 26.3

## 2018-05-02 MED ORDER — HEPARIN SOD (PORK) LOCK FLUSH 100 UNIT/ML IV SOLN: 500.0000 [IU] | Freq: Once | INTRAVENOUS | Status: DC

## 2018-05-02 NOTE — Progress Notes (Signed)
Patient is here today to follow up on her urothelial carcinoma. Patient stated that she had been doing well. Patient had an episode of loose stools yesterday but then she got better. Patient denied fever, chills, nausea, vomiting and constipation.

## 2018-05-03 LAB — THYROID PANEL WITH TSH
Free Thyroxine Index: 1.8 (ref 1.2–4.9)
T3 UPTAKE RATIO: 27 % (ref 24–39)
T4 TOTAL: 6.5 ug/dL (ref 4.5–12.0)
TSH: 5.82 u[IU]/mL — AB (ref 0.450–4.500)

## 2018-05-09 ENCOUNTER — Inpatient Hospital Stay: Payer: Medicare Other

## 2018-05-09 DIAGNOSIS — C679 Malignant neoplasm of bladder, unspecified: Secondary | ICD-10-CM

## 2018-05-09 DIAGNOSIS — Z5111 Encounter for antineoplastic chemotherapy: Secondary | ICD-10-CM | POA: Diagnosis not present

## 2018-05-09 DIAGNOSIS — C689 Malignant neoplasm of urinary organ, unspecified: Secondary | ICD-10-CM

## 2018-05-09 LAB — COMPREHENSIVE METABOLIC PANEL
ALT: 259 U/L — ABNORMAL HIGH (ref 0–44)
ANION GAP: 8 (ref 5–15)
AST: 191 U/L — ABNORMAL HIGH (ref 15–41)
Albumin: 3.2 g/dL — ABNORMAL LOW (ref 3.5–5.0)
Alkaline Phosphatase: 142 U/L — ABNORMAL HIGH (ref 38–126)
BUN: 26 mg/dL — ABNORMAL HIGH (ref 8–23)
CO2: 24 mmol/L (ref 22–32)
Calcium: 8.8 mg/dL — ABNORMAL LOW (ref 8.9–10.3)
Chloride: 106 mmol/L (ref 98–111)
Creatinine, Ser: 1.49 mg/dL — ABNORMAL HIGH (ref 0.44–1.00)
GFR calc non Af Amer: 30 mL/min — ABNORMAL LOW (ref >60)
GFR, EST AFRICAN AMERICAN: 35 mL/min — AB (ref >60)
Glucose, Bld: 171 mg/dL — ABNORMAL HIGH (ref 70–99)
Potassium: 4.2 mmol/L (ref 3.5–5.1)
Sodium: 138 mmol/L (ref 135–145)
Total Bilirubin: 0.4 mg/dL (ref 0.3–1.2)
Total Protein: 6.1 g/dL — ABNORMAL LOW (ref 6.5–8.1)

## 2018-05-09 LAB — CBC WITH DIFFERENTIAL/PLATELET
Abs Immature Granulocytes: 0.04 10*3/uL (ref 0.00–0.07)
Basophils Absolute: 0 10*3/uL (ref 0.0–0.1)
Basophils Relative: 0 %
Eosinophils Absolute: 0.1 10*3/uL (ref 0.0–0.5)
Eosinophils Relative: 2 %
HCT: 28.4 % — ABNORMAL LOW (ref 36.0–46.0)
Hemoglobin: 9 g/dL — ABNORMAL LOW (ref 12.0–15.0)
Immature Granulocytes: 1 %
Lymphocytes Relative: 15 %
Lymphs Abs: 0.7 10*3/uL (ref 0.7–4.0)
MCH: 29.6 pg (ref 26.0–34.0)
MCHC: 31.7 g/dL (ref 30.0–36.0)
MCV: 93.4 fL (ref 80.0–100.0)
Monocytes Absolute: 0.5 10*3/uL (ref 0.1–1.0)
Monocytes Relative: 9 %
NEUTROS PCT: 73 %
Neutro Abs: 3.7 10*3/uL (ref 1.7–7.7)
Platelets: 152 10*3/uL (ref 150–400)
RBC: 3.04 MIL/uL — AB (ref 3.87–5.11)
RDW: 16.7 % — ABNORMAL HIGH (ref 11.5–15.5)
WBC: 5 10*3/uL (ref 4.0–10.5)
nRBC: 0 % (ref 0.0–0.2)

## 2018-05-10 LAB — THYROID PANEL WITH TSH
Free Thyroxine Index: 1.2 (ref 1.2–4.9)
T3 Uptake Ratio: 20 % — ABNORMAL LOW (ref 24–39)
T4, Total: 5.8 ug/dL (ref 4.5–12.0)
TSH: 3.83 u[IU]/mL (ref 0.450–4.500)

## 2018-05-13 NOTE — Progress Notes (Signed)
[place]  [phone] [phone]  ID: Kristi Thompson OB: 05/21/1923  MR#: [id number]  PRX#:[id number]  Patient Care Team: [name], MD as PCP - General (Internal Medicine) Clent Jacks, RN as Registered Nurse  CHIEF COMPLAINT: Urothelial carcinoma.  INTERVAL HISTORY: Patient returns to clinic today for further evaluation and consideration of cycle 3, day 1 of single agent gemcitabine.  Kristi Thompson currently feels well and is asymptomatic. Kristi Thompson has no neurologic complaints.  Kristi Thompson denies any recent fevers or illnesses.  Kristi Thompson has a good appetite and denies weight loss. Kristi Thompson has no chest pain, shortness of breath, cough, or hemoptysis.  Kristi Thompson denies any nausea, vomiting, constipation, or diarrhea.  Kristi Thompson denies any melena or hematochezia.  Kristi Thompson has no urinary complaints.  Patient offers no specific complaints today.  REVIEW OF SYSTEMS:   Review of Systems  Constitutional: Negative.  Negative for fever, malaise/fatigue and weight loss.  Respiratory: Negative.  Negative for cough, hemoptysis and shortness of breath.   Cardiovascular: Negative.  Negative for chest pain and leg swelling.  Gastrointestinal: Negative.  Negative for abdominal pain, blood in stool, constipation, diarrhea, melena, nausea and vomiting.  Genitourinary: Negative.  Negative for dysuria and hematuria.  Musculoskeletal: Negative.  Negative for back pain.  Skin: Negative.  Negative for rash.  Neurological: Negative.  Negative for dizziness, focal weakness, weakness and headaches.  Psychiatric/Behavioral: Negative.  The patient is not nervous/anxious.     As per HPI. Otherwise, a complete review of systems is negative.  PAST MEDICAL HISTORY: Past Medical History:  Diagnosis Date  . Asthma   . Cancer ([place]) 1978   colon ca  . COPD (chronic obstructive pulmonary disease) ([place])   . GERD (gastroesophageal reflux disease)   . History of hiatal hernia     PAST SURGICAL  HISTORY: Past Surgical History:  Procedure Laterality Date  . ABDOMINAL HYSTERECTOMY    . APPENDECTOMY    . bowel obstruction    . CATARACT EXTRACTION W/ INTRAOCULAR LENS  IMPLANT, BILATERAL    . CHOLECYSTECTOMY    . COLON SURGERY     colon cancer  . COLONOSCOPY WITH PROPOFOL N/A 01/18/2018   Procedure: COLONOSCOPY WITH PROPOFOL;  Surgeon: Jonathon Bellows, MD;  Location: [facility] ENDOSCOPY;  Service: Gastroenterology;  Laterality: N/A;  . CYSTOSCOPY W/ URETERAL STENT PLACEMENT Right 02/06/2018   Procedure: CYSTOSCOPY WITH RETROGRADE PYELOGRAM;  Surgeon: Hollice Espy, MD;  Location: [facility] ORS;  Service: Urology;  Laterality: Right;  . CYSTOSCOPY WITH URETEROSCOPY Right 02/06/2018   Procedure: CYSTOSCOPY WITH URETEROSCOPY;  Surgeon: Hollice Espy, MD;  Location: [facility] ORS;  Service: Urology;  Laterality: Right;  . PORTA CATH INSERTION N/A 02/11/2018   Procedure: PORTA CATH INSERTION;  Surgeon: Algernon Huxley, MD;  Location: [place] CV LAB;  Service: Cardiovascular;  Laterality: N/A;  . THYROID SURGERY     nodule removed  . THYROIDECTOMY    . TONSILLECTOMY    . URETERAL BIOPSY Right 02/06/2018   Procedure: URETERAL BIOPSY;  Surgeon: Hollice Espy, MD;  Location: [facility] ORS;  Service: Urology;  Laterality: Right;    FAMILY HISTORY: Negative and noncontributory. History reviewed. No pertinent family history.  ADVANCED DIRECTIVES (Y/N):  N  HEALTH MAINTENANCE: Social History   Tobacco Use  . Smoking status: Never Smoker  . Smokeless tobacco: Never Used  Substance Use Topics  . Alcohol use: Yes    Comment: rarely  . Drug use: No     Colonoscopy:  PAP:  Bone density:  Lipid panel:  Allergies  Allergen Reactions  . Demerol [Meperidine] Swelling  . Sulfa Antibiotics Swelling    Current Outpatient Medications  Medication Sig Dispense Refill  . acetaminophen (TYLENOL) 325 MG tablet Take 325 mg by mouth every 6 (six) hours as needed.    Marland Kitchen albuterol (PROVENTIL HFA;VENTOLIN  HFA) 108 (90 Base) MCG/ACT inhaler Inhale into the lungs.    Marland Kitchen amoxicillin-clavulanate (AUGMENTIN) 500-125 MG tablet Take 1 tablet (500 mg total) by mouth 2 (two) times daily. 14 tablet 0  . aspirin EC 81 MG tablet Take by mouth.    . budesonide (PULMICORT FLEXHALER) 180 MCG/ACT inhaler Inhale into the lungs.    . Cyanocobalamin 2500 MCG SUBL Place under the tongue.    . famotidine (PEPCID) 40 MG tablet Take by mouth.    . fluticasone (FLONASE) 50 MCG/ACT nasal spray USE 2 SPRAYS INTO BOTH     NOSTRILS NIGHTLY    . HYDROcodone-acetaminophen (NORCO/VICODIN) 5-325 MG tablet TK 1 T PO Q 6 H PRN  0  . mirabegron ER (MYRBETRIQ) 25 MG TB24 tablet Take 1 tablet (25 mg total) by mouth daily. 30 tablet 4  . montelukast (SINGULAIR) 10 MG tablet TAKE 1 TABLET DAILY    . Multiple Vitamin (MULTI-VITAMINS) TABS Take by mouth.    . Multiple Vitamins-Minerals (MULTIVITAMIN ADULT PO) Take by mouth.    . ondansetron (ZOFRAN) 8 MG tablet Take 1 tablet (8 mg total) by mouth 2 (two) times daily as needed (Nausea or vomiting). 30 tablet 2  . predniSONE (STERAPRED UNI-PAK 21 TAB) 10 MG (21) TBPK tablet Take as directed. 21 tablet 0  . prochlorperazine (COMPAZINE) 10 MG tablet Take 1 tablet (10 mg total) by mouth every 6 (six) hours as needed (Nausea or vomiting). 60 tablet 2  . ranitidine (ZANTAC) 300 MG tablet Take 1 tablet by mouth 1 day or 1 dose.    . tamsulosin (FLOMAX) 0.4 MG CAPS capsule Take by mouth.     No current facility-administered medications for this visit.     OBJECTIVE: Vitals:   05/16/18 0924  BP: 135/63  Pulse: 62  Temp: 97.6 F (36.4 C)     Body mass index is 25.05 kg/m.    ECOG FS:0 - Asymptomatic  General: Well-developed, well-nourished, no acute distress. Eyes: Pink conjunctiva, anicteric sclera. HEENT: Normocephalic, moist mucous membranes. Lungs: Clear to auscultation bilaterally. Heart: Regular rate and rhythm. No rubs, murmurs, or gallops. Abdomen: Soft, nontender,  nondistended. No organomegaly noted, normoactive bowel sounds. Musculoskeletal: No edema, cyanosis, or clubbing. Neuro: Alert, answering all questions appropriately. Cranial nerves grossly intact. Skin: No rashes or petechiae noted. Psych: Normal affect.  LAB RESULTS:  Lab Results  Component Value Date   NA 137 05/16/2018   K 4.3 05/16/2018   CL 108 05/16/2018   CO2 22 05/16/2018   GLUCOSE 163 (H) 05/16/2018   BUN 22 05/16/2018   CREATININE 1.31 (H) 05/16/2018   CALCIUM 8.8 (L) 05/16/2018   PROT 6.1 (L) 05/16/2018   ALBUMIN 3.1 (L) 05/16/2018   AST 128 (H) 05/16/2018   ALT 171 (H) 05/16/2018   ALKPHOS 328 (H) 05/16/2018   BILITOT 1.6 (H) 05/16/2018   GFRNONAA 35 (L) 05/16/2018   GFRAA 41 (L) 05/16/2018    Lab Results  Component Value Date   WBC 4.9 05/16/2018   NEUTROABS 3.3 05/16/2018   HGB 9.4 (L) 05/16/2018   HCT 29.3 (L) 05/16/2018   MCV 96.1 05/16/2018   PLT 173 05/16/2018     STUDIES:  Dg Chest 2 View  Result Date: 04/24/2018 CLINICAL DATA:  Fever, cough. EXAM: CHEST - 2 VIEW COMPARISON:  Radiographs of February 22, 2015. FINDINGS: Stable cardiomediastinal silhouette. Atherosclerosis of thoracic aorta is noted. Interval placement of right internal jugular Port-A-Cath with distal tip in expected position of the SVC. No pneumothorax or pleural effusion is noted. No acute pulmonary disease is noted. Bony thorax is unremarkable. IMPRESSION: No active cardiopulmonary disease. Aortic Atherosclerosis (ICD10-I70.0). Electronically Signed   By: Marijo Conception, M.D.   On: 04/24/2018 12:00    ASSESSMENT: Urothelial carcinoma, PDL-1 0%.  PLAN:    1.  Urothelial carcinoma: Case discussed with urology.  Cystoscopy on February 06, 2018 revealed a right ureteral mass.  Cytology report with high-grade urothelial carcinoma consistent with previous biopsy of abdominal mass.  Unfortunately, patient's PDL-1 is 0% therefore Tecentriq may not offer much benefit.  Tecentriq has been  discontinued.  Initial plan was to give gemcitabine on days 1, 8, and 15 with a 22 off, but given problems with thrombocytopenia and neutropenia Kristi Thompson may only be able to tolerate treatment on days 1 and 8, or possibly every other week.  Given her advanced age and decreased kidney function, hesitant to add cisplatin.  Proceed with cycle 3, day 1 of gemcitabine today.  Return to clinic in 1 week for further evaluation and consideration of cycle 3, day 8.  Plan to reimage at the conclusion of cycle.   2.  Hydronephrosis: Stent placement was not possible.  Nephrostomy tube was briefly discussed, but patient declined this at this time. 3.  Renal insufficiency: Patient's creatinine remains elevated, but essentially stable at 1.31. Likely secondary to obstruction from tumor.  No nephrostomy tubes at this time.  No cisplatin as above.  Continue 1 L IV fluids with treatments.   4.  Anemia: Hemoglobin is 9.4 today.  Proceed with her second infusion of 510 mg IV Feraheme today.  5.  Pain: Resolved. 6.  Lower extremity edema: Ultrasound was negative for DVT.  Monitor. 7.  Neutropenia: Resolved. 8.  Fevers: Resolved.  9.  Hematuria: Only occurred once, monitor.   Patient expressed understanding and was in agreement with this plan. Kristi Thompson also understands that Kristi Thompson can call clinic at any time with any questions, concerns, or complaints.   Cancer Staging No matching staging information was found for the patient.  Lloyd Huger, MD   05/17/2018 9:48 AM

## 2018-05-16 ENCOUNTER — Encounter: Payer: Self-pay | Admitting: Oncology

## 2018-05-16 ENCOUNTER — Inpatient Hospital Stay (HOSPITAL_BASED_OUTPATIENT_CLINIC_OR_DEPARTMENT_OTHER): Payer: Medicare Other | Admitting: Oncology

## 2018-05-16 ENCOUNTER — Inpatient Hospital Stay: Payer: Medicare Other | Attending: Hospice and Palliative Medicine | Admitting: Hospice and Palliative Medicine

## 2018-05-16 ENCOUNTER — Other Ambulatory Visit: Payer: Self-pay

## 2018-05-16 ENCOUNTER — Inpatient Hospital Stay: Payer: Medicare Other

## 2018-05-16 ENCOUNTER — Inpatient Hospital Stay: Payer: Medicare Other | Attending: Oncology

## 2018-05-16 VITALS — BP 135/63 | HR 62 | Temp 97.6°F | Ht 61.0 in | Wt 132.6 lb

## 2018-05-16 DIAGNOSIS — D709 Neutropenia, unspecified: Secondary | ICD-10-CM | POA: Diagnosis not present

## 2018-05-16 DIAGNOSIS — N2889 Other specified disorders of kidney and ureter: Secondary | ICD-10-CM | POA: Insufficient documentation

## 2018-05-16 DIAGNOSIS — Z5111 Encounter for antineoplastic chemotherapy: Secondary | ICD-10-CM | POA: Insufficient documentation

## 2018-05-16 DIAGNOSIS — Z7982 Long term (current) use of aspirin: Secondary | ICD-10-CM

## 2018-05-16 DIAGNOSIS — K449 Diaphragmatic hernia without obstruction or gangrene: Secondary | ICD-10-CM | POA: Insufficient documentation

## 2018-05-16 DIAGNOSIS — R600 Localized edema: Secondary | ICD-10-CM | POA: Insufficient documentation

## 2018-05-16 DIAGNOSIS — K219 Gastro-esophageal reflux disease without esophagitis: Secondary | ICD-10-CM | POA: Diagnosis not present

## 2018-05-16 DIAGNOSIS — Z85038 Personal history of other malignant neoplasm of large intestine: Secondary | ICD-10-CM | POA: Diagnosis not present

## 2018-05-16 DIAGNOSIS — C679 Malignant neoplasm of bladder, unspecified: Secondary | ICD-10-CM | POA: Diagnosis not present

## 2018-05-16 DIAGNOSIS — C689 Malignant neoplasm of urinary organ, unspecified: Secondary | ICD-10-CM

## 2018-05-16 DIAGNOSIS — J449 Chronic obstructive pulmonary disease, unspecified: Secondary | ICD-10-CM | POA: Diagnosis not present

## 2018-05-16 DIAGNOSIS — Z515 Encounter for palliative care: Secondary | ICD-10-CM | POA: Diagnosis not present

## 2018-05-16 DIAGNOSIS — R590 Localized enlarged lymph nodes: Secondary | ICD-10-CM | POA: Insufficient documentation

## 2018-05-16 DIAGNOSIS — Z79899 Other long term (current) drug therapy: Secondary | ICD-10-CM | POA: Insufficient documentation

## 2018-05-16 DIAGNOSIS — D649 Anemia, unspecified: Secondary | ICD-10-CM | POA: Insufficient documentation

## 2018-05-16 DIAGNOSIS — N1330 Unspecified hydronephrosis: Secondary | ICD-10-CM | POA: Diagnosis not present

## 2018-05-16 DIAGNOSIS — D509 Iron deficiency anemia, unspecified: Secondary | ICD-10-CM

## 2018-05-16 LAB — COMPREHENSIVE METABOLIC PANEL
ALT: 171 U/L — ABNORMAL HIGH (ref 0–44)
AST: 128 U/L — ABNORMAL HIGH (ref 15–41)
Albumin: 3.1 g/dL — ABNORMAL LOW (ref 3.5–5.0)
Alkaline Phosphatase: 328 U/L — ABNORMAL HIGH (ref 38–126)
Anion gap: 7 (ref 5–15)
BILIRUBIN TOTAL: 1.6 mg/dL — AB (ref 0.3–1.2)
BUN: 22 mg/dL (ref 8–23)
CO2: 22 mmol/L (ref 22–32)
Calcium: 8.8 mg/dL — ABNORMAL LOW (ref 8.9–10.3)
Chloride: 108 mmol/L (ref 98–111)
Creatinine, Ser: 1.31 mg/dL — ABNORMAL HIGH (ref 0.44–1.00)
GFR calc Af Amer: 41 mL/min — ABNORMAL LOW (ref >60)
GFR calc non Af Amer: 35 mL/min — ABNORMAL LOW (ref >60)
Glucose, Bld: 163 mg/dL — ABNORMAL HIGH (ref 70–99)
Potassium: 4.3 mmol/L (ref 3.5–5.1)
Sodium: 137 mmol/L (ref 135–145)
Total Protein: 6.1 g/dL — ABNORMAL LOW (ref 6.5–8.1)

## 2018-05-16 LAB — CBC WITH DIFFERENTIAL/PLATELET
Abs Immature Granulocytes: 0.01 10*3/uL (ref 0.00–0.07)
BASOS PCT: 1 %
Basophils Absolute: 0 10*3/uL (ref 0.0–0.1)
EOS PCT: 8 %
Eosinophils Absolute: 0.4 10*3/uL (ref 0.0–0.5)
HCT: 29.3 % — ABNORMAL LOW (ref 36.0–46.0)
Hemoglobin: 9.4 g/dL — ABNORMAL LOW (ref 12.0–15.0)
Immature Granulocytes: 0 %
Lymphocytes Relative: 11 %
Lymphs Abs: 0.5 10*3/uL — ABNORMAL LOW (ref 0.7–4.0)
MCH: 30.8 pg (ref 26.0–34.0)
MCHC: 32.1 g/dL (ref 30.0–36.0)
MCV: 96.1 fL (ref 80.0–100.0)
Monocytes Absolute: 0.6 10*3/uL (ref 0.1–1.0)
Monocytes Relative: 12 %
Neutro Abs: 3.3 10*3/uL (ref 1.7–7.7)
Neutrophils Relative %: 68 %
PLATELETS: 173 10*3/uL (ref 150–400)
RBC: 3.05 MIL/uL — ABNORMAL LOW (ref 3.87–5.11)
RDW: 20.4 % — ABNORMAL HIGH (ref 11.5–15.5)
WBC: 4.9 10*3/uL (ref 4.0–10.5)
nRBC: 0 % (ref 0.0–0.2)

## 2018-05-16 MED ORDER — SODIUM CHLORIDE 0.9% FLUSH
10.0000 mL | Freq: Once | INTRAVENOUS | Status: AC
  Administered 2018-05-16: 10 mL via INTRAVENOUS
  Filled 2018-05-16: qty 10

## 2018-05-16 MED ORDER — SODIUM CHLORIDE 0.9 % IV SOLN
Freq: Once | INTRAVENOUS | Status: DC
  Filled 2018-05-16: qty 250

## 2018-05-16 MED ORDER — SODIUM CHLORIDE 0.9 % IV SOLN
510.0000 mg | Freq: Once | INTRAVENOUS | Status: AC
  Administered 2018-05-16: 510 mg via INTRAVENOUS
  Filled 2018-05-16: qty 17

## 2018-05-16 MED ORDER — SODIUM CHLORIDE 0.9 % IV SOLN
Freq: Once | INTRAVENOUS | Status: AC
  Administered 2018-05-16: 12:00:00 via INTRAVENOUS
  Filled 2018-05-16: qty 250

## 2018-05-16 MED ORDER — SODIUM CHLORIDE 0.9 % IV SOLN
1300.0000 mg | Freq: Once | INTRAVENOUS | Status: AC
  Administered 2018-05-16: 1300 mg via INTRAVENOUS
  Filled 2018-05-16: qty 26.3

## 2018-05-16 MED ORDER — SODIUM CHLORIDE 0.9 % IV SOLN
Freq: Once | INTRAVENOUS | Status: AC
  Administered 2018-05-16: 11:00:00 via INTRAVENOUS
  Filled 2018-05-16: qty 250

## 2018-05-16 MED ORDER — PROCHLORPERAZINE MALEATE 10 MG PO TABS
10.0000 mg | ORAL_TABLET | Freq: Once | ORAL | Status: AC
  Administered 2018-05-16: 10 mg via ORAL

## 2018-05-16 MED ORDER — HEPARIN SOD (PORK) LOCK FLUSH 100 UNIT/ML IV SOLN
500.0000 [IU] | Freq: Once | INTRAVENOUS | Status: AC
  Administered 2018-05-16: 500 [IU] via INTRAVENOUS

## 2018-05-16 NOTE — Progress Notes (Signed)
Patient is here today to follow up on her urothelial carcinoma. Patient stated that she had been doing well with no complaints.

## 2018-05-16 NOTE — Progress Notes (Signed)
[place]  [phone] [phone]   Name: Kristi Thompson Date: 05/16/2018 MRN: [id number]  DOB: 1923/11/10  Patient Care Team: [name], MD as PCP - General (Internal Medicine) Clent Jacks, RN as Registered Nurse    REASON FOR CONSULTATION: Palliative Care consult requested for this 85 y.o. female with multiple medical problems including asthma, COPD, and GERD, who was recently found to have a pelvic mass with right hydronephrosis and retroperitoneal lymphadenopathy.  Biopsy of the right periadrenal lymph node was consistent with carcinoma of unknown primary but favored urothelial origin.  Patient was treated on atezolizumab.  She was referred to palliative care to help address goals and support her through the treatment process.  SOCIAL HISTORY:    Patient is widowed.  She lives at [facility] in a senior apartment.  She has a daughter who lives in [place] and a son and daughter who lives in [place].  She is originally from [place].  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  ADVANCE DIRECTIVES:  Patient has healthcare power of attorney and living will on file.  Her living will specifies a desire for natural death.  CODE STATUS: DNR  PAST MEDICAL HISTORY: Past Medical History:  Diagnosis Date  . Asthma   . Cancer ([place]) 1978   colon ca  . COPD (chronic obstructive pulmonary disease) ([place])   . GERD (gastroesophageal reflux disease)   . History of hiatal hernia     PAST SURGICAL HISTORY:  Past Surgical History:  Procedure Laterality Date  . ABDOMINAL HYSTERECTOMY    . APPENDECTOMY    . bowel obstruction    . CATARACT EXTRACTION W/ INTRAOCULAR LENS  IMPLANT, BILATERAL    . CHOLECYSTECTOMY    . COLON SURGERY     colon cancer  . COLONOSCOPY WITH PROPOFOL N/A 01/18/2018   Procedure: COLONOSCOPY WITH PROPOFOL;  Surgeon: Jonathon Bellows, MD;  Location: [facility] ENDOSCOPY;  Service: Gastroenterology;  Laterality:  N/A;  . CYSTOSCOPY W/ URETERAL STENT PLACEMENT Right 02/06/2018   Procedure: CYSTOSCOPY WITH RETROGRADE PYELOGRAM;  Surgeon: Hollice Espy, MD;  Location: [facility] ORS;  Service: Urology;  Laterality: Right;  . CYSTOSCOPY WITH URETEROSCOPY Right 02/06/2018   Procedure: CYSTOSCOPY WITH URETEROSCOPY;  Surgeon: Hollice Espy, MD;  Location: [facility] ORS;  Service: Urology;  Laterality: Right;  . PORTA CATH INSERTION N/A 02/11/2018   Procedure: PORTA CATH INSERTION;  Surgeon: Algernon Huxley, MD;  Location: [place] [facility] LAB;  Service: Cardiovascular;  Laterality: N/A;  . THYROID SURGERY     nodule removed  . THYROIDECTOMY    . TONSILLECTOMY    . URETERAL BIOPSY Right 02/06/2018   Procedure: URETERAL BIOPSY;  Surgeon: Hollice Espy, MD;  Location: [facility] ORS;  Service: Urology;  Laterality: Right;    HEMATOLOGY/ONCOLOGY HISTORY:    Urothelial carcinoma ([place])   02/12/2018 Initial Diagnosis    Urothelial carcinoma ([place])    02/14/2018 - 03/27/2018 Chemotherapy    The patient had atezolizumab (TECENTRIQ) 1,200 mg in sodium chloride 0.9 % 250 mL chemo infusion, 1,200 mg, Intravenous, Once, 2 of 6 cycles Administration: 1,200 mg (02/14/2018), 1,200 mg (03/07/2018)  for chemotherapy treatment.     03/28/2018 -  Chemotherapy    The patient had gemcitabine (GEMZAR) 1,300 mg in sodium chloride 0.9 % 250 mL chemo infusion, 1,292 mg (100 % of original dose 800 mg/m2), Intravenous,  Once, 3 of 4 cycles Dose modification: 800 mg/m2 (original dose 800 mg/m2, Cycle 1, Reason: Patient  Age) Administration: 1,300 mg (03/28/2018), 1,300 mg (04/04/2018), 1,300 mg (04/18/2018), 1,300 mg (05/02/2018)  for chemotherapy treatment.      ALLERGIES:  is allergic to demerol [meperidine] and sulfa antibiotics.  MEDICATIONS:  Current Outpatient Medications  Medication Sig Dispense Refill  . acetaminophen (TYLENOL) 325 MG tablet Take 325 mg by mouth every 6 (six) hours as needed.    Marland Kitchen albuterol (PROVENTIL HFA;VENTOLIN HFA) 108  (90 Base) MCG/ACT inhaler Inhale into the lungs.    Marland Kitchen amoxicillin-clavulanate (AUGMENTIN) 500-125 MG tablet Take 1 tablet (500 mg total) by mouth 2 (two) times daily. 14 tablet 0  . aspirin EC 81 MG tablet Take by mouth.    . budesonide (PULMICORT FLEXHALER) 180 MCG/ACT inhaler Inhale into the lungs.    . Cyanocobalamin 2500 MCG SUBL Place under the tongue.    . famotidine (PEPCID) 40 MG tablet Take by mouth.    . fluticasone (FLONASE) 50 MCG/ACT nasal spray USE 2 SPRAYS INTO BOTH     NOSTRILS NIGHTLY    . HYDROcodone-acetaminophen (NORCO/VICODIN) 5-325 MG tablet TK 1 T PO Q 6 H PRN  0  . mirabegron ER (MYRBETRIQ) 25 MG TB24 tablet Take 1 tablet (25 mg total) by mouth daily. 30 tablet 4  . montelukast (SINGULAIR) 10 MG tablet TAKE 1 TABLET DAILY    . Multiple Vitamin (MULTI-VITAMINS) TABS Take by mouth.    . Multiple Vitamins-Minerals (MULTIVITAMIN ADULT PO) Take by mouth.    . ondansetron (ZOFRAN) 8 MG tablet Take 1 tablet (8 mg total) by mouth 2 (two) times daily as needed (Nausea or vomiting). 30 tablet 2  . predniSONE (STERAPRED UNI-PAK 21 TAB) 10 MG (21) TBPK tablet Take as directed. 21 tablet 0  . prochlorperazine (COMPAZINE) 10 MG tablet Take 1 tablet (10 mg total) by mouth every 6 (six) hours as needed (Nausea or vomiting). 60 tablet 2  . ranitidine (ZANTAC) 300 MG tablet Take 1 tablet by mouth 1 day or 1 dose.    . tamsulosin (FLOMAX) 0.4 MG CAPS capsule Take by mouth.     No current facility-administered medications for this visit.    Facility-Administered Medications Ordered in Other Visits  Medication Dose Route Frequency Provider Last Rate Last Dose  . 0.9 %  sodium chloride infusion   Intravenous Once Lloyd Huger, MD      . heparin lock flush 100 unit/mL  500 Units Intravenous Once Lloyd Huger, MD        VITAL SIGNS: LMP  (LMP Unknown)  There were no vitals filed for this visit.  Estimated body mass index is 25.05 kg/m as calculated from the following:    Height as of an earlier encounter on 05/16/18: '5\' 1"'  (1.549 m).   Weight as of an earlier encounter on 05/16/18: 132 lb 9.6 oz (60.1 kg).  LABS: CBC:    Component Value Date/Time   WBC 4.9 05/16/2018 0900   HGB 9.4 (L) 05/16/2018 0900   HGB 12.9 04/30/2013 2116   HCT 29.3 (L) 05/16/2018 0900   HCT 39.1 04/30/2013 2116   PLT 173 05/16/2018 0900   PLT 229 04/30/2013 2116   MCV 96.1 05/16/2018 0900   MCV 93 04/30/2013 2116   NEUTROABS 3.3 05/16/2018 0900   LYMPHSABS 0.5 (L) 05/16/2018 0900   MONOABS 0.6 05/16/2018 0900   EOSABS 0.4 05/16/2018 0900   BASOSABS 0.0 05/16/2018 0900   Comprehensive Metabolic Panel:    Component Value Date/Time   NA 137 05/16/2018 0900   NA 140  04/30/2013 2116   K 4.3 05/16/2018 0900   K 3.8 04/30/2013 2116   CL 108 05/16/2018 0900   CL 107 04/30/2013 2116   CO2 22 05/16/2018 0900   CO2 29 04/30/2013 2116   BUN 22 05/16/2018 0900   BUN 25 (H) 04/30/2013 2116   CREATININE 1.31 (H) 05/16/2018 0900   CREATININE 1.01 04/30/2013 2116   GLUCOSE 163 (H) 05/16/2018 0900   GLUCOSE 126 (H) 04/30/2013 2116   CALCIUM 8.8 (L) 05/16/2018 0900   CALCIUM 8.9 04/30/2013 2116   AST 128 (H) 05/16/2018 0900   ALT 171 (H) 05/16/2018 0900   ALKPHOS 328 (H) 05/16/2018 0900   BILITOT 1.6 (H) 05/16/2018 0900   PROT 6.1 (L) 05/16/2018 0900   ALBUMIN 3.1 (L) 05/16/2018 0900    RADIOGRAPHIC STUDIES: Dg Chest 2 View  Result Date: 04/24/2018 CLINICAL DATA:  Fever, cough. EXAM: CHEST - 2 VIEW COMPARISON:  Radiographs of February 22, 2015. FINDINGS: Stable cardiomediastinal silhouette. Atherosclerosis of thoracic aorta is noted. Interval placement of right internal jugular Port-A-Cath with distal tip in expected position of the SVC. No pneumothorax or pleural effusion is noted. No acute pulmonary disease is noted. Bony thorax is unremarkable. IMPRESSION: No active cardiopulmonary disease. Aortic Atherosclerosis (ICD10-I70.0). Electronically Signed   By: Marijo Conception, M.D.    On: 04/24/2018 12:00    PERFORMANCE STATUS (ECOG) : 1 - Symptomatic but completely ambulatory  Review of Systems As noted above. Otherwise, a complete review of systems is negative.  Physical Exam General: NAD, frail appearing, thin Pulmonary: unlabored, CTA ant/post fields Cards: RRR ABD: soft, nttp, BS + Extremities: trace edema BLEs, RLE>LLE, negative Homans sign Skin: no rashes Neurological: Weakness but otherwise nonfocal  IMPRESSION: I met today with patient and daughter today for follow up visit.   Patient says she feels she has been doing reasonably well without any changes or concerns.  She has worked with OT and wants to maintain her balance and ambulatory ability.  She denies any changes in performance status or recent falls.  Symptomatically, patient says she is doing well.  She denies any pain or other distressing symptoms.  She says she sleeps well and is coping well with her illness.  Fluid intake continues to be a problem.  Discussed ways to encourage p.o. intake.  Patient has found oral supplements to be intolerable.  We will plan to complete digital MOST form when Rupert Stacks is live.    PLAN: Treatment plan as outlined per oncology Continue supportive care Plan to complete MOST form during future visit RTC in 1 month   Patient expressed understanding and was in agreement with this plan. She also understands that She can call clinic at any time with any questions, concerns, or complaints.    Time Total: 15 minutes  Visit consisted of counseling and education dealing with the complex and emotionally intense issues of symptom management and palliative care in the setting of serious and potentially life-threatening illness.Greater than 50%  of this time was spent counseling and coordinating care related to the above assessment and plan.  Signed by: Altha Harm, PhD, NP-C [phone] (Work Cell)

## 2018-05-17 LAB — THYROID PANEL WITH TSH
Free Thyroxine Index: 1.2 (ref 1.2–4.9)
T3 Uptake Ratio: 20 % — ABNORMAL LOW (ref 24–39)
T4, Total: 5.8 ug/dL (ref 4.5–12.0)
TSH: 5.42 u[IU]/mL — ABNORMAL HIGH (ref 0.450–4.500)

## 2018-05-19 NOTE — Progress Notes (Signed)
[place]  [phone] [phone]) [phone]  ID: [name] OB: 1923-05-18  MR#: [id number]  HKV#:[id number]  Patient Care Team: [name], MD as PCP - General (Internal Medicine) Clent Jacks, RN as Registered Nurse  CHIEF COMPLAINT: Urothelial carcinoma.  INTERVAL HISTORY: Patient returns to clinic today for further evaluation and consideration of cycle 3, day 8 of single agent gemcitabine.  She continues to feel well and remains asymptomatic.  She is tolerating her treatments well without significant side effects.  She continues to have mild lower extremity edema.  She has no neurologic complaints.  She denies any recent fevers or illnesses.  She has a good appetite and denies weight loss. She has no chest pain, shortness of breath, cough, or hemoptysis.  She denies any nausea, vomiting, constipation, or diarrhea.  She denies any melena or hematochezia.  She has no urinary complaints.  Patient feels at her baseline offers no specific complaints today.  REVIEW OF SYSTEMS:   Review of Systems  Constitutional: Negative.  Negative for fever, malaise/fatigue and weight loss.  Respiratory: Negative.  Negative for cough, hemoptysis and shortness of breath.   Cardiovascular: Positive for leg swelling. Negative for chest pain.  Gastrointestinal: Negative.  Negative for abdominal pain, blood in stool, constipation, diarrhea, melena, nausea and vomiting.  Genitourinary: Negative.  Negative for dysuria and hematuria.  Musculoskeletal: Negative.  Negative for back pain.  Skin: Negative.  Negative for rash.  Neurological: Negative.  Negative for dizziness, focal weakness, weakness and headaches.  Psychiatric/Behavioral: Negative.  The patient is not nervous/anxious.     As per HPI. Otherwise, a complete review of systems is negative.  PAST MEDICAL HISTORY: Past Medical History:  Diagnosis Date  . Asthma   . Cancer ([place]) 1978   colon ca  . COPD  (chronic obstructive pulmonary disease) ([place])   . GERD (gastroesophageal reflux disease)   . History of hiatal hernia     PAST SURGICAL HISTORY: Past Surgical History:  Procedure Laterality Date  . ABDOMINAL HYSTERECTOMY    . APPENDECTOMY    . bowel obstruction    . CATARACT EXTRACTION W/ INTRAOCULAR LENS  IMPLANT, BILATERAL    . CHOLECYSTECTOMY    . COLON SURGERY     colon cancer  . COLONOSCOPY WITH PROPOFOL N/A 01/18/2018   Procedure: COLONOSCOPY WITH PROPOFOL;  Surgeon: Jonathon Bellows, MD;  Location: [facility] ENDOSCOPY;  Service: Gastroenterology;  Laterality: N/A;  . CYSTOSCOPY W/ URETERAL STENT PLACEMENT Right 02/06/2018   Procedure: CYSTOSCOPY WITH RETROGRADE PYELOGRAM;  Surgeon: Hollice Espy, MD;  Location: [facility] ORS;  Service: Urology;  Laterality: Right;  . CYSTOSCOPY WITH URETEROSCOPY Right 02/06/2018   Procedure: CYSTOSCOPY WITH URETEROSCOPY;  Surgeon: Hollice Espy, MD;  Location: [facility] ORS;  Service: Urology;  Laterality: Right;  . PORTA CATH INSERTION N/A 02/11/2018   Procedure: PORTA CATH INSERTION;  Surgeon: Algernon Huxley, MD;  Location: [place] [facility] LAB;  Service: Cardiovascular;  Laterality: N/A;  . THYROID SURGERY     nodule removed  . THYROIDECTOMY    . TONSILLECTOMY    . URETERAL BIOPSY Right 02/06/2018   Procedure: URETERAL BIOPSY;  Surgeon: Hollice Espy, MD;  Location: [facility] ORS;  Service: Urology;  Laterality: Right;    FAMILY HISTORY: Negative and noncontributory. History reviewed. No pertinent family history.  ADVANCED DIRECTIVES (Y/N):  N  HEALTH MAINTENANCE: Social History   Tobacco Use  . Smoking status: Never Smoker  . Smokeless tobacco: Never Used  Substance Use Topics  .  Alcohol use: Yes    Comment: rarely  . Drug use: No     Colonoscopy:  PAP:  Bone density:  Lipid panel:  Allergies  Allergen Reactions  . Demerol [Meperidine] Swelling  . Sulfa Antibiotics Swelling    Current Outpatient Medications  Medication Sig Dispense  Refill  . acetaminophen (TYLENOL) 325 MG tablet Take 325 mg by mouth every 6 (six) hours as needed.    Marland Kitchen albuterol (PROVENTIL HFA;VENTOLIN HFA) 108 (90 Base) MCG/ACT inhaler Inhale into the lungs.    Marland Kitchen amoxicillin-clavulanate (AUGMENTIN) 500-125 MG tablet Take 1 tablet (500 mg total) by mouth 2 (two) times daily. 14 tablet 0  . aspirin EC 81 MG tablet Take by mouth.    . budesonide (PULMICORT FLEXHALER) 180 MCG/ACT inhaler Inhale into the lungs.    . Cyanocobalamin 2500 MCG SUBL Place under the tongue.    . famotidine (PEPCID) 40 MG tablet Take by mouth.    . fluticasone (FLONASE) 50 MCG/ACT nasal spray USE 2 SPRAYS INTO BOTH     NOSTRILS NIGHTLY    . HYDROcodone-acetaminophen (NORCO/VICODIN) 5-325 MG tablet TK 1 T PO Q 6 H PRN  0  . mirabegron ER (MYRBETRIQ) 25 MG TB24 tablet Take 1 tablet (25 mg total) by mouth daily. 30 tablet 4  . montelukast (SINGULAIR) 10 MG tablet TAKE 1 TABLET DAILY    . Multiple Vitamin (MULTI-VITAMINS) TABS Take by mouth.    . Multiple Vitamins-Minerals (MULTIVITAMIN ADULT PO) Take by mouth.    . ondansetron (ZOFRAN) 8 MG tablet Take 1 tablet (8 mg total) by mouth 2 (two) times daily as needed (Nausea or vomiting). 30 tablet 2  . predniSONE (STERAPRED UNI-PAK 21 TAB) 10 MG (21) TBPK tablet Take as directed. 21 tablet 0  . prochlorperazine (COMPAZINE) 10 MG tablet Take 1 tablet (10 mg total) by mouth every 6 (six) hours as needed (Nausea or vomiting). 60 tablet 2  . ranitidine (ZANTAC) 300 MG tablet Take 1 tablet by mouth 1 day or 1 dose.    . tamsulosin (FLOMAX) 0.4 MG CAPS capsule Take by mouth.     No current facility-administered medications for this visit.     OBJECTIVE: Vitals:   05/23/18 1043  BP: (!) 169/75  Pulse: 83  Resp: 18  Temp: 98.3 F (36.8 C)     Body mass index is 25.22 kg/m.    ECOG FS:0 - Asymptomatic  General: Well-developed, well-nourished, no acute distress. Eyes: Pink conjunctiva, anicteric sclera. HEENT: Normocephalic, moist  mucous membranes. Lungs: Clear to auscultation bilaterally. Heart: Regular rate and rhythm. No rubs, murmurs, or gallops. Abdomen: Soft, nontender, nondistended. No organomegaly noted, normoactive bowel sounds. Musculoskeletal: No edema, cyanosis, or clubbing. Neuro: Alert, answering all questions appropriately. Cranial nerves grossly intact. Skin: No rashes or petechiae noted. Psych: Normal affect.  LAB RESULTS:  Lab Results  Component Value Date   NA 137 05/23/2018   K 4.1 05/23/2018   CL 107 05/23/2018   CO2 23 05/23/2018   GLUCOSE 155 (H) 05/23/2018   BUN 23 05/23/2018   CREATININE 1.46 (H) 05/23/2018   CALCIUM 9.0 05/23/2018   PROT 6.2 (L) 05/23/2018   ALBUMIN 3.2 (L) 05/23/2018   AST 127 (H) 05/23/2018   ALT 182 (H) 05/23/2018   ALKPHOS 270 (H) 05/23/2018   BILITOT 0.6 05/23/2018   GFRNONAA 31 (L) 05/23/2018   GFRAA 36 (L) 05/23/2018    Lab Results  Component Value Date   WBC 1.9 (L) 05/23/2018   NEUTROABS 0.7 (L)  05/23/2018   HGB 8.6 (L) 05/23/2018   HCT 26.6 (L) 05/23/2018   MCV 97.1 05/23/2018   PLT 279 05/23/2018     STUDIES: No results found.  ASSESSMENT: Urothelial carcinoma, PDL-1 0%.  PLAN:    1.  Urothelial carcinoma: Case discussed with urology.  Cystoscopy on February 06, 2018 revealed a right ureteral mass.  Cytology report with high-grade urothelial carcinoma consistent with previous biopsy of abdominal mass.  Unfortunately, patient's PDL-1 is 0% therefore Tecentriq may not offer much benefit.  Tecentriq has been discontinued.  Initial plan was to give gemcitabine on days 1, 8, and 15 with a 22 off, but given problems with thrombocytopenia and neutropenia she can only tolerate treatment on days 1 and 15.  Given her advanced age and decreased kidney function, hesitant to add cisplatin.  Delay cycle 3, day 8 of gemcitabine today secondary to neutropenia.  Return to clinic in 1 week for further evaluation and consideration of cycle 3, day 15. Plan to  reimage at the conclusion of cycle 4.   2.  Hydronephrosis: Stent placement was not possible.  Nephrostomy tube was briefly discussed, but patient declined this at this time. 3.  Renal insufficiency: Patient's creatinine has trended up slightly to 1.46.  Monitor. Likely secondary to obstruction from tumor.  No nephrostomy tubes at this time.  No cisplatin as above.  Continue 1 L IV fluids with treatments.   4.  Anemia: Patient's hemoglobin has trended down slightly to 8.6.  She last received IV Feraheme on May 16, 2018. 5.  Pain: Resolved. 6.  Lower extremity edema: Ultrasound was negative for DVT.  Monitor. 7.  Neutropenia: Treatment on days 1 and 15 as above.     Patient expressed understanding and was in agreement with this plan. She also understands that She can call clinic at any time with any questions, concerns, or complaints.   Cancer Staging No matching staging information was found for the patient.  Lloyd Huger, MD   05/24/2018 2:35 PM

## 2018-05-23 ENCOUNTER — Other Ambulatory Visit: Payer: Self-pay

## 2018-05-23 ENCOUNTER — Inpatient Hospital Stay: Payer: Medicare Other

## 2018-05-23 ENCOUNTER — Inpatient Hospital Stay (HOSPITAL_BASED_OUTPATIENT_CLINIC_OR_DEPARTMENT_OTHER): Payer: Medicare Other | Admitting: Oncology

## 2018-05-23 ENCOUNTER — Encounter: Payer: Self-pay | Admitting: Oncology

## 2018-05-23 VITALS — BP 169/75 | HR 83 | Temp 98.3°F | Resp 18 | Wt 133.5 lb

## 2018-05-23 DIAGNOSIS — Z79899 Other long term (current) drug therapy: Secondary | ICD-10-CM

## 2018-05-23 DIAGNOSIS — R600 Localized edema: Secondary | ICD-10-CM

## 2018-05-23 DIAGNOSIS — C689 Malignant neoplasm of urinary organ, unspecified: Secondary | ICD-10-CM

## 2018-05-23 DIAGNOSIS — D649 Anemia, unspecified: Secondary | ICD-10-CM | POA: Diagnosis not present

## 2018-05-23 DIAGNOSIS — Z5111 Encounter for antineoplastic chemotherapy: Secondary | ICD-10-CM

## 2018-05-23 DIAGNOSIS — N1330 Unspecified hydronephrosis: Secondary | ICD-10-CM

## 2018-05-23 DIAGNOSIS — K219 Gastro-esophageal reflux disease without esophagitis: Secondary | ICD-10-CM

## 2018-05-23 DIAGNOSIS — J449 Chronic obstructive pulmonary disease, unspecified: Secondary | ICD-10-CM

## 2018-05-23 DIAGNOSIS — C679 Malignant neoplasm of bladder, unspecified: Secondary | ICD-10-CM

## 2018-05-23 DIAGNOSIS — N2889 Other specified disorders of kidney and ureter: Secondary | ICD-10-CM

## 2018-05-23 DIAGNOSIS — D709 Neutropenia, unspecified: Secondary | ICD-10-CM | POA: Diagnosis not present

## 2018-05-23 DIAGNOSIS — R590 Localized enlarged lymph nodes: Secondary | ICD-10-CM

## 2018-05-23 DIAGNOSIS — K449 Diaphragmatic hernia without obstruction or gangrene: Secondary | ICD-10-CM

## 2018-05-23 DIAGNOSIS — Z85038 Personal history of other malignant neoplasm of large intestine: Secondary | ICD-10-CM

## 2018-05-23 DIAGNOSIS — Z95828 Presence of other vascular implants and grafts: Secondary | ICD-10-CM

## 2018-05-23 DIAGNOSIS — Z7982 Long term (current) use of aspirin: Secondary | ICD-10-CM

## 2018-05-23 LAB — CBC WITH DIFFERENTIAL/PLATELET
ABS IMMATURE GRANULOCYTES: 0.02 10*3/uL (ref 0.00–0.07)
Basophils Absolute: 0.1 10*3/uL (ref 0.0–0.1)
Basophils Relative: 3 %
EOS PCT: 2 %
Eosinophils Absolute: 0 10*3/uL (ref 0.0–0.5)
HCT: 26.6 % — ABNORMAL LOW (ref 36.0–46.0)
HEMOGLOBIN: 8.6 g/dL — AB (ref 12.0–15.0)
Immature Granulocytes: 1 %
Lymphocytes Relative: 30 %
Lymphs Abs: 0.6 10*3/uL — ABNORMAL LOW (ref 0.7–4.0)
MCH: 31.4 pg (ref 26.0–34.0)
MCHC: 32.3 g/dL (ref 30.0–36.0)
MCV: 97.1 fL (ref 80.0–100.0)
MONO ABS: 0.5 10*3/uL (ref 0.1–1.0)
Monocytes Relative: 29 %
NEUTROS ABS: 0.7 10*3/uL — AB (ref 1.7–7.7)
Neutrophils Relative %: 35 %
Platelets: 279 10*3/uL (ref 150–400)
RBC: 2.74 MIL/uL — ABNORMAL LOW (ref 3.87–5.11)
RDW: 20 % — ABNORMAL HIGH (ref 11.5–15.5)
WBC: 1.9 10*3/uL — ABNORMAL LOW (ref 4.0–10.5)
nRBC: 1.1 % — ABNORMAL HIGH (ref 0.0–0.2)

## 2018-05-23 LAB — COMPREHENSIVE METABOLIC PANEL
ALT: 182 U/L — ABNORMAL HIGH (ref 0–44)
AST: 127 U/L — ABNORMAL HIGH (ref 15–41)
Albumin: 3.2 g/dL — ABNORMAL LOW (ref 3.5–5.0)
Alkaline Phosphatase: 270 U/L — ABNORMAL HIGH (ref 38–126)
Anion gap: 7 (ref 5–15)
BUN: 23 mg/dL (ref 8–23)
CO2: 23 mmol/L (ref 22–32)
Calcium: 9 mg/dL (ref 8.9–10.3)
Chloride: 107 mmol/L (ref 98–111)
Creatinine, Ser: 1.46 mg/dL — ABNORMAL HIGH (ref 0.44–1.00)
GFR calc Af Amer: 36 mL/min — ABNORMAL LOW (ref >60)
GFR, EST NON AFRICAN AMERICAN: 31 mL/min — AB (ref >60)
Glucose, Bld: 155 mg/dL — ABNORMAL HIGH (ref 70–99)
Potassium: 4.1 mmol/L (ref 3.5–5.1)
Sodium: 137 mmol/L (ref 135–145)
Total Bilirubin: 0.6 mg/dL (ref 0.3–1.2)
Total Protein: 6.2 g/dL — ABNORMAL LOW (ref 6.5–8.1)

## 2018-05-23 MED ORDER — SODIUM CHLORIDE 0.9% FLUSH
10.0000 mL | Freq: Once | INTRAVENOUS | Status: AC
  Administered 2018-05-23: 10 mL via INTRAVENOUS
  Filled 2018-05-23: qty 10

## 2018-05-23 MED ORDER — HEPARIN SOD (PORK) LOCK FLUSH 100 UNIT/ML IV SOLN
500.0000 [IU] | Freq: Once | INTRAVENOUS | Status: AC
  Administered 2018-05-23: 500 [IU] via INTRAVENOUS

## 2018-05-23 NOTE — Progress Notes (Signed)
Patient reports swelling in right leg.

## 2018-05-24 LAB — THYROID PANEL WITH TSH
Free Thyroxine Index: 1.2 (ref 1.2–4.9)
T3 Uptake Ratio: 19 % — ABNORMAL LOW (ref 24–39)
T4, Total: 6.5 ug/dL (ref 4.5–12.0)
TSH: 4.55 u[IU]/mL — ABNORMAL HIGH (ref 0.450–4.500)

## 2018-05-29 NOTE — Progress Notes (Signed)
[place]  [phone] Fax:(336) [phone]  ID: [name] OB: [birth year]  MR#: [id number]  NLG#:[id number]  Patient Care Team: [name], MD as PCP - General (Internal Medicine) Clent Jacks, RN as Registered Nurse  CHIEF COMPLAINT: Urothelial carcinoma.  INTERVAL HISTORY: Patient returns to clinic today for further evaluation and consideration of cycle 3, day 15 of single agent gemcitabine.  She continues to feel well and remains asymptomatic.  Her lower extremity edema has improved with compression stockings and elevation. She has no neurologic complaints.  She denies any recent fevers or illnesses.  She has a good appetite and denies weight loss. She has no chest pain, shortness of breath, cough, or hemoptysis.  She denies any nausea, vomiting, constipation, or diarrhea.  She denies any melena or hematochezia.  She has no urinary complaints.  Patient offers no specific complaints today.  REVIEW OF SYSTEMS:   Review of Systems  Constitutional: Negative.  Negative for fever, malaise/fatigue and weight loss.  Respiratory: Negative.  Negative for cough, hemoptysis and shortness of breath.   Cardiovascular: Positive for leg swelling. Negative for chest pain.  Gastrointestinal: Negative.  Negative for abdominal pain, blood in stool, constipation, diarrhea, melena, nausea and vomiting.  Genitourinary: Negative.  Negative for dysuria and hematuria.  Musculoskeletal: Negative.  Negative for back pain.  Skin: Negative.  Negative for rash.  Neurological: Negative.  Negative for dizziness, focal weakness, weakness and headaches.  Psychiatric/Behavioral: Negative.  The patient is not nervous/anxious.     As per HPI. Otherwise, a complete review of systems is negative.  PAST MEDICAL HISTORY: Past Medical History:  Diagnosis Date  . Asthma   . Cancer ([place]) 1978   colon ca  . COPD (chronic obstructive pulmonary disease) ([place])   . GERD  (gastroesophageal reflux disease)   . History of hiatal hernia     PAST SURGICAL HISTORY: Past Surgical History:  Procedure Laterality Date  . ABDOMINAL HYSTERECTOMY    . APPENDECTOMY    . bowel obstruction    . CATARACT EXTRACTION W/ INTRAOCULAR LENS  IMPLANT, BILATERAL    . CHOLECYSTECTOMY    . COLON SURGERY     colon cancer  . COLONOSCOPY WITH PROPOFOL N/A 01/18/2018   Procedure: COLONOSCOPY WITH PROPOFOL;  Surgeon: Jonathon Bellows, MD;  Location: [facility] ENDOSCOPY;  Service: Gastroenterology;  Laterality: N/A;  . CYSTOSCOPY W/ URETERAL STENT PLACEMENT Right 02/06/2018   Procedure: CYSTOSCOPY WITH RETROGRADE PYELOGRAM;  Surgeon: Hollice Espy, MD;  Location: [facility] ORS;  Service: Urology;  Laterality: Right;  . CYSTOSCOPY WITH URETEROSCOPY Right 02/06/2018   Procedure: CYSTOSCOPY WITH URETEROSCOPY;  Surgeon: Hollice Espy, MD;  Location: [facility] ORS;  Service: Urology;  Laterality: Right;  . PORTA CATH INSERTION N/A 02/11/2018   Procedure: PORTA CATH INSERTION;  Surgeon: Algernon Huxley, MD;  Location: [place] [facility] LAB;  Service: Cardiovascular;  Laterality: N/A;  . THYROID SURGERY     nodule removed  . THYROIDECTOMY    . TONSILLECTOMY    . URETERAL BIOPSY Right 02/06/2018   Procedure: URETERAL BIOPSY;  Surgeon: Hollice Espy, MD;  Location: [facility] ORS;  Service: Urology;  Laterality: Right;    FAMILY HISTORY: Negative and noncontributory. History reviewed. No pertinent family history.  ADVANCED DIRECTIVES (Y/N):  N  HEALTH MAINTENANCE: Social History   Tobacco Use  . Smoking status: Never Smoker  . Smokeless tobacco: Never Used  Substance Use Topics  . Alcohol use: Yes    Comment: rarely  . Drug use:  No     Colonoscopy:  PAP:  Bone density:  Lipid panel:  Allergies  Allergen Reactions  . Demerol [Meperidine] Swelling  . Sulfa Antibiotics Swelling    Current Outpatient Medications  Medication Sig Dispense Refill  . acetaminophen (TYLENOL) 325 MG tablet Take 325  mg by mouth every 6 (six) hours as needed.    Marland Kitchen albuterol (PROVENTIL HFA;VENTOLIN HFA) 108 (90 Base) MCG/ACT inhaler Inhale into the lungs.    Marland Kitchen aspirin EC 81 MG tablet Take by mouth.    . Cyanocobalamin 2500 MCG SUBL Place under the tongue.    . famotidine (PEPCID) 40 MG tablet Take by mouth.    . fluticasone (FLONASE) 50 MCG/ACT nasal spray USE 2 SPRAYS INTO BOTH     NOSTRILS NIGHTLY    . HYDROcodone-acetaminophen (NORCO/VICODIN) 5-325 MG tablet TK 1 T PO Q 6 H PRN  0  . mirabegron ER (MYRBETRIQ) 25 MG TB24 tablet Take 1 tablet (25 mg total) by mouth daily. 30 tablet 4  . montelukast (SINGULAIR) 10 MG tablet TAKE 1 TABLET DAILY    . Multiple Vitamin (MULTI-VITAMINS) TABS Take by mouth.    . Multiple Vitamins-Minerals (MULTIVITAMIN ADULT PO) Take by mouth.    . ondansetron (ZOFRAN) 8 MG tablet Take 1 tablet (8 mg total) by mouth 2 (two) times daily as needed (Nausea or vomiting). 30 tablet 2  . predniSONE (STERAPRED UNI-PAK 21 TAB) 10 MG (21) TBPK tablet Take as directed. 21 tablet 0  . prochlorperazine (COMPAZINE) 10 MG tablet Take 1 tablet (10 mg total) by mouth every 6 (six) hours as needed (Nausea or vomiting). 60 tablet 2  . ranitidine (ZANTAC) 300 MG tablet Take 1 tablet by mouth 1 day or 1 dose.    . tamsulosin (FLOMAX) 0.4 MG CAPS capsule Take by mouth.    . budesonide (PULMICORT FLEXHALER) 180 MCG/ACT inhaler Inhale into the lungs.     No current facility-administered medications for this visit.    Facility-Administered Medications Ordered in Other Visits  Medication Dose Route Frequency Provider Last Rate Last Dose  . gemcitabine (GEMZAR) 1,300 mg in sodium chloride 0.9 % 250 mL chemo infusion  1,300 mg Intravenous Once Lloyd Huger, MD 568 mL/hr at 05/30/18 1049 1,300 mg at 05/30/18 1049  . heparin lock flush 100 unit/mL  500 Units Intravenous Once Lloyd Huger, MD      . sodium chloride flush (NS) 0.9 % injection 10 mL  10 mL Intravenous PRN Lloyd Huger, MD    10 mL at 05/30/18 0928    OBJECTIVE: Vitals:   05/30/18 0944  BP: 134/78  Pulse: 79  Resp: 18  Temp: 97.8 F (36.6 C)     Body mass index is 25.32 kg/m.    ECOG FS:0 - Asymptomatic  General: Well-developed, well-nourished, no acute distress. Eyes: Pink conjunctiva, anicteric sclera. HEENT: Normocephalic, moist mucous membranes. Lungs: Clear to auscultation bilaterally. Heart: Regular rate and rhythm. No rubs, murmurs, or gallops. Abdomen: Soft, nontender, nondistended. No organomegaly noted, normoactive bowel sounds. Musculoskeletal: Mild bilateral lower extremity edema. Neuro: Alert, answering all questions appropriately. Cranial nerves grossly intact. Skin: No rashes or petechiae noted. Psych: Normal affect.  LAB RESULTS:  Lab Results  Component Value Date   NA 138 05/30/2018   K 4.0 05/30/2018   CL 108 05/30/2018   CO2 23 05/30/2018   GLUCOSE 152 (H) 05/30/2018   BUN 26 (H) 05/30/2018   CREATININE 1.31 (H) 05/30/2018   CALCIUM 8.6 (L) 05/30/2018  PROT 5.9 (L) 05/30/2018   ALBUMIN 3.0 (L) 05/30/2018   AST 34 05/30/2018   ALT 45 (H) 05/30/2018   ALKPHOS 187 (H) 05/30/2018   BILITOT 0.6 05/30/2018   GFRNONAA 35 (L) 05/30/2018   GFRAA 41 (L) 05/30/2018    Lab Results  Component Value Date   WBC 6.1 05/30/2018   NEUTROABS 4.3 05/30/2018   HGB 9.9 (L) 05/30/2018   HCT 31.0 (L) 05/30/2018   MCV 99.0 05/30/2018   PLT 195 05/30/2018     STUDIES: No results found.  ASSESSMENT: Urothelial carcinoma, PDL-1 0%.  PLAN:    1.  Urothelial carcinoma: Case discussed with urology.  Cystoscopy on February 06, 2018 revealed a right ureteral mass.  Cytology report with high-grade urothelial carcinoma consistent with previous biopsy of abdominal mass.  Unfortunately, patient's PDL-1 is 0% therefore Tecentriq may not offer much benefit.  Tecentriq has been discontinued.  Initial plan was to give gemcitabine on days 1, 8, and 15 with a 22 off, but given problems with  thrombocytopenia and neutropenia she can only tolerate treatment on days 1 and 15.  Given her advanced age and decreased kidney function, hesitant to add cisplatin.  Proceed with cycle 3, day 15 of gemcitabine only today.  Return to clinic in 2 weeks for further evaluation and consideration of cycle 4, day 1.  Plan to reimage at the conclusion of cycle 4.   2.  Hydronephrosis: Stent placement was not possible.  Nephrostomy tube was briefly discussed, but patient declined this at this time. 3.  Renal insufficiency: Patient's creatinine slightly improved to 1.31.  Monitor. Likely secondary to obstruction from tumor.  No nephrostomy tubes at this time.  No cisplatin as above.  Continue 1 L IV fluids with treatments.   4.  Anemia: Patient's hemoglobin improved to 9.9.  She last received IV Feraheme on May 16, 2018. 5.  Pain: Resolved. 6.  Lower extremity edema: Ultrasound was negative for DVT.  Continue compression stocking and elevation. 7.  Neutropenia: Resolved.  Proceed with treatment as above.   Patient expressed understanding and was in agreement with this plan. She also understands that She can call clinic at any time with any questions, concerns, or complaints.   Cancer Staging No matching staging information was found for the patient.  Lloyd Huger, MD   05/30/2018 10:51 AM

## 2018-05-30 ENCOUNTER — Inpatient Hospital Stay (HOSPITAL_BASED_OUTPATIENT_CLINIC_OR_DEPARTMENT_OTHER): Payer: Medicare Other | Admitting: Oncology

## 2018-05-30 ENCOUNTER — Encounter: Payer: Self-pay | Admitting: Oncology

## 2018-05-30 ENCOUNTER — Inpatient Hospital Stay: Payer: Medicare Other

## 2018-05-30 ENCOUNTER — Other Ambulatory Visit: Payer: Self-pay

## 2018-05-30 VITALS — BP 134/78 | HR 79 | Temp 97.8°F | Resp 18 | Wt 134.0 lb

## 2018-05-30 DIAGNOSIS — N1330 Unspecified hydronephrosis: Secondary | ICD-10-CM

## 2018-05-30 DIAGNOSIS — K219 Gastro-esophageal reflux disease without esophagitis: Secondary | ICD-10-CM

## 2018-05-30 DIAGNOSIS — C689 Malignant neoplasm of urinary organ, unspecified: Secondary | ICD-10-CM

## 2018-05-30 DIAGNOSIS — Z5111 Encounter for antineoplastic chemotherapy: Secondary | ICD-10-CM

## 2018-05-30 DIAGNOSIS — Z7982 Long term (current) use of aspirin: Secondary | ICD-10-CM

## 2018-05-30 DIAGNOSIS — K449 Diaphragmatic hernia without obstruction or gangrene: Secondary | ICD-10-CM

## 2018-05-30 DIAGNOSIS — J449 Chronic obstructive pulmonary disease, unspecified: Secondary | ICD-10-CM

## 2018-05-30 DIAGNOSIS — D709 Neutropenia, unspecified: Secondary | ICD-10-CM

## 2018-05-30 DIAGNOSIS — Z79899 Other long term (current) drug therapy: Secondary | ICD-10-CM

## 2018-05-30 DIAGNOSIS — N2889 Other specified disorders of kidney and ureter: Secondary | ICD-10-CM

## 2018-05-30 DIAGNOSIS — D649 Anemia, unspecified: Secondary | ICD-10-CM | POA: Diagnosis not present

## 2018-05-30 DIAGNOSIS — Z85038 Personal history of other malignant neoplasm of large intestine: Secondary | ICD-10-CM

## 2018-05-30 DIAGNOSIS — R600 Localized edema: Secondary | ICD-10-CM

## 2018-05-30 DIAGNOSIS — R590 Localized enlarged lymph nodes: Secondary | ICD-10-CM

## 2018-05-30 LAB — CBC WITH DIFFERENTIAL/PLATELET
Abs Immature Granulocytes: 0.03 10*3/uL (ref 0.00–0.07)
Basophils Absolute: 0.1 10*3/uL (ref 0.0–0.1)
Basophils Relative: 1 %
Eosinophils Absolute: 0.2 10*3/uL (ref 0.0–0.5)
Eosinophils Relative: 3 %
HCT: 31 % — ABNORMAL LOW (ref 36.0–46.0)
Hemoglobin: 9.9 g/dL — ABNORMAL LOW (ref 12.0–15.0)
Immature Granulocytes: 1 %
Lymphocytes Relative: 12 %
Lymphs Abs: 0.7 10*3/uL (ref 0.7–4.0)
MCH: 31.6 pg (ref 26.0–34.0)
MCHC: 31.9 g/dL (ref 30.0–36.0)
MCV: 99 fL (ref 80.0–100.0)
Monocytes Absolute: 0.9 10*3/uL (ref 0.1–1.0)
Monocytes Relative: 14 %
Neutro Abs: 4.3 10*3/uL (ref 1.7–7.7)
Neutrophils Relative %: 69 %
Platelets: 195 10*3/uL (ref 150–400)
RBC: 3.13 MIL/uL — ABNORMAL LOW (ref 3.87–5.11)
RDW: 20.9 % — ABNORMAL HIGH (ref 11.5–15.5)
WBC: 6.1 10*3/uL (ref 4.0–10.5)
nRBC: 0 % (ref 0.0–0.2)

## 2018-05-30 LAB — COMPREHENSIVE METABOLIC PANEL
ALT: 45 U/L — AB (ref 0–44)
AST: 34 U/L (ref 15–41)
Albumin: 3 g/dL — ABNORMAL LOW (ref 3.5–5.0)
Alkaline Phosphatase: 187 U/L — ABNORMAL HIGH (ref 38–126)
Anion gap: 7 (ref 5–15)
BILIRUBIN TOTAL: 0.6 mg/dL (ref 0.3–1.2)
BUN: 26 mg/dL — ABNORMAL HIGH (ref 8–23)
CO2: 23 mmol/L (ref 22–32)
CREATININE: 1.31 mg/dL — AB (ref 0.44–1.00)
Calcium: 8.6 mg/dL — ABNORMAL LOW (ref 8.9–10.3)
Chloride: 108 mmol/L (ref 98–111)
GFR calc Af Amer: 41 mL/min — ABNORMAL LOW (ref >60)
GFR calc non Af Amer: 35 mL/min — ABNORMAL LOW (ref >60)
Glucose, Bld: 152 mg/dL — ABNORMAL HIGH (ref 70–99)
Potassium: 4 mmol/L (ref 3.5–5.1)
Sodium: 138 mmol/L (ref 135–145)
Total Protein: 5.9 g/dL — ABNORMAL LOW (ref 6.5–8.1)

## 2018-05-30 MED ORDER — SODIUM CHLORIDE 0.9 % IV SOLN
Freq: Once | INTRAVENOUS | Status: AC
  Administered 2018-05-30: 10:00:00 via INTRAVENOUS
  Filled 2018-05-30: qty 250

## 2018-05-30 MED ORDER — SODIUM CHLORIDE 0.9 % IV SOLN
1300.0000 mg | Freq: Once | INTRAVENOUS | Status: AC
  Administered 2018-05-30: 1300 mg via INTRAVENOUS
  Filled 2018-05-30: qty 26.3

## 2018-05-30 MED ORDER — SODIUM CHLORIDE 0.9% FLUSH
10.0000 mL | INTRAVENOUS | Status: DC | PRN
  Administered 2018-05-30: 10 mL via INTRAVENOUS
  Filled 2018-05-30: qty 10

## 2018-05-30 MED ORDER — PROCHLORPERAZINE MALEATE 10 MG PO TABS
10.0000 mg | ORAL_TABLET | Freq: Once | ORAL | Status: AC
  Administered 2018-05-30: 10 mg via ORAL
  Filled 2018-05-30: qty 1

## 2018-05-30 MED ORDER — HEPARIN SOD (PORK) LOCK FLUSH 100 UNIT/ML IV SOLN
500.0000 [IU] | Freq: Once | INTRAVENOUS | Status: AC
  Administered 2018-05-30: 500 [IU] via INTRAVENOUS
  Filled 2018-05-30: qty 5

## 2018-05-30 MED ORDER — SODIUM CHLORIDE 0.9 % IV SOLN
Freq: Once | INTRAVENOUS | Status: AC
  Filled 2018-05-30: qty 250

## 2018-05-30 NOTE — Progress Notes (Signed)
Patient denies any concerns today.  

## 2018-06-12 ENCOUNTER — Other Ambulatory Visit: Payer: Self-pay

## 2018-06-13 ENCOUNTER — Inpatient Hospital Stay: Payer: Medicare Other | Attending: Hospice and Palliative Medicine | Admitting: Hospice and Palliative Medicine

## 2018-06-13 ENCOUNTER — Other Ambulatory Visit: Payer: Self-pay

## 2018-06-13 ENCOUNTER — Inpatient Hospital Stay (HOSPITAL_BASED_OUTPATIENT_CLINIC_OR_DEPARTMENT_OTHER): Payer: Medicare Other | Admitting: Oncology

## 2018-06-13 ENCOUNTER — Encounter: Payer: Self-pay | Admitting: Oncology

## 2018-06-13 ENCOUNTER — Inpatient Hospital Stay: Payer: Medicare Other

## 2018-06-13 VITALS — BP 170/73 | HR 81 | Temp 97.9°F | Ht 61.0 in | Wt 133.0 lb

## 2018-06-13 DIAGNOSIS — Z7982 Long term (current) use of aspirin: Secondary | ICD-10-CM

## 2018-06-13 DIAGNOSIS — J449 Chronic obstructive pulmonary disease, unspecified: Secondary | ICD-10-CM

## 2018-06-13 DIAGNOSIS — D649 Anemia, unspecified: Secondary | ICD-10-CM | POA: Insufficient documentation

## 2018-06-13 DIAGNOSIS — C689 Malignant neoplasm of urinary organ, unspecified: Secondary | ICD-10-CM | POA: Diagnosis not present

## 2018-06-13 DIAGNOSIS — K449 Diaphragmatic hernia without obstruction or gangrene: Secondary | ICD-10-CM | POA: Insufficient documentation

## 2018-06-13 DIAGNOSIS — Z79899 Other long term (current) drug therapy: Secondary | ICD-10-CM | POA: Diagnosis not present

## 2018-06-13 DIAGNOSIS — Z5111 Encounter for antineoplastic chemotherapy: Secondary | ICD-10-CM

## 2018-06-13 DIAGNOSIS — C779 Secondary and unspecified malignant neoplasm of lymph node, unspecified: Secondary | ICD-10-CM

## 2018-06-13 DIAGNOSIS — R609 Edema, unspecified: Secondary | ICD-10-CM

## 2018-06-13 DIAGNOSIS — Z95828 Presence of other vascular implants and grafts: Secondary | ICD-10-CM

## 2018-06-13 DIAGNOSIS — N1330 Unspecified hydronephrosis: Secondary | ICD-10-CM | POA: Diagnosis not present

## 2018-06-13 DIAGNOSIS — K219 Gastro-esophageal reflux disease without esophagitis: Secondary | ICD-10-CM

## 2018-06-13 DIAGNOSIS — Z515 Encounter for palliative care: Secondary | ICD-10-CM

## 2018-06-13 LAB — CBC WITH DIFFERENTIAL/PLATELET
Abs Immature Granulocytes: 0.02 10*3/uL (ref 0.00–0.07)
Basophils Absolute: 0 10*3/uL (ref 0.0–0.1)
Basophils Relative: 1 %
Eosinophils Absolute: 0.2 10*3/uL (ref 0.0–0.5)
Eosinophils Relative: 3 %
HCT: 31.5 % — ABNORMAL LOW (ref 36.0–46.0)
Hemoglobin: 10.4 g/dL — ABNORMAL LOW (ref 12.0–15.0)
Immature Granulocytes: 0 %
Lymphocytes Relative: 18 %
Lymphs Abs: 1.1 10*3/uL (ref 0.7–4.0)
MCH: 33 pg (ref 26.0–34.0)
MCHC: 33 g/dL (ref 30.0–36.0)
MCV: 100 fL (ref 80.0–100.0)
Monocytes Absolute: 0.8 10*3/uL (ref 0.1–1.0)
Monocytes Relative: 13 %
Neutro Abs: 4.2 10*3/uL (ref 1.7–7.7)
Neutrophils Relative %: 65 %
Platelets: 187 10*3/uL (ref 150–400)
RBC: 3.15 MIL/uL — ABNORMAL LOW (ref 3.87–5.11)
RDW: 19.7 % — ABNORMAL HIGH (ref 11.5–15.5)
WBC: 6.3 10*3/uL (ref 4.0–10.5)
nRBC: 0 % (ref 0.0–0.2)

## 2018-06-13 LAB — COMPREHENSIVE METABOLIC PANEL
ALT: 22 U/L (ref 0–44)
AST: 35 U/L (ref 15–41)
Albumin: 3.3 g/dL — ABNORMAL LOW (ref 3.5–5.0)
Alkaline Phosphatase: 125 U/L (ref 38–126)
Anion gap: 7 (ref 5–15)
BUN: 19 mg/dL (ref 8–23)
CO2: 23 mmol/L (ref 22–32)
Calcium: 8.9 mg/dL (ref 8.9–10.3)
Chloride: 108 mmol/L (ref 98–111)
Creatinine, Ser: 1.38 mg/dL — ABNORMAL HIGH (ref 0.44–1.00)
GFR calc Af Amer: 38 mL/min — ABNORMAL LOW (ref >60)
GFR calc non Af Amer: 33 mL/min — ABNORMAL LOW (ref >60)
Glucose, Bld: 153 mg/dL — ABNORMAL HIGH (ref 70–99)
Potassium: 3.9 mmol/L (ref 3.5–5.1)
Sodium: 138 mmol/L (ref 135–145)
Total Bilirubin: 0.4 mg/dL (ref 0.3–1.2)
Total Protein: 6.1 g/dL — ABNORMAL LOW (ref 6.5–8.1)

## 2018-06-13 MED ORDER — HEPARIN SOD (PORK) LOCK FLUSH 100 UNIT/ML IV SOLN
500.0000 [IU] | Freq: Once | INTRAVENOUS | Status: AC
  Administered 2018-06-13: 500 [IU] via INTRAVENOUS

## 2018-06-13 MED ORDER — PROCHLORPERAZINE MALEATE 10 MG PO TABS
10.0000 mg | ORAL_TABLET | Freq: Once | ORAL | Status: AC
  Administered 2018-06-13: 10 mg via ORAL
  Filled 2018-06-13: qty 1

## 2018-06-13 MED ORDER — SODIUM CHLORIDE 0.9 % IV SOLN
Freq: Once | INTRAVENOUS | Status: AC
  Administered 2018-06-13: 10:00:00 via INTRAVENOUS
  Filled 2018-06-13: qty 250

## 2018-06-13 MED ORDER — SODIUM CHLORIDE 0.9% FLUSH
10.0000 mL | Freq: Once | INTRAVENOUS | Status: AC
  Administered 2018-06-13: 10 mL via INTRAVENOUS
  Filled 2018-06-13: qty 10

## 2018-06-13 MED ORDER — SODIUM CHLORIDE 0.9 % IV SOLN
1300.0000 mg | Freq: Once | INTRAVENOUS | Status: AC
  Administered 2018-06-13: 1300 mg via INTRAVENOUS
  Filled 2018-06-13: qty 26.3

## 2018-06-13 NOTE — Progress Notes (Signed)
Patient stated that she had been doing well with no complaints. 

## 2018-06-13 NOTE — Progress Notes (Signed)
[place]  [phone] [phone]) [phone]   Name: Kristi Thompson Date: 06/13/2018 MRN: [id number]  DOB: 12-24-1923  Patient Care Team: [name], MD as PCP - General (Internal Medicine) Clent Jacks, RN as Registered Nurse    REASON FOR CONSULTATION: Palliative Care consult requested for this 85 y.o. female with multiple medical problems including asthma, COPD, and GERD, who was recently found to have a pelvic mass with right hydronephrosis and retroperitoneal lymphadenopathy.  Biopsy of the right periadrenal lymph node was consistent with carcinoma of unknown primary but favored urothelial origin.  Patient was treated on atezolizumab.  She was referred to palliative care to help address goals and support her through the treatment process.  SOCIAL HISTORY:    Patient is widowed.  She lives at [facility] in a senior apartment.  She has a daughter who lives in [place] and a son and daughter who lives in [place].  She is originally from [place].  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  ADVANCE DIRECTIVES:  Patient has healthcare power of attorney and living will on file.  Her living will specifies a desire for natural death.  CODE STATUS: DNR  PAST MEDICAL HISTORY: Past Medical History:  Diagnosis Date  . Asthma   . Cancer ([place]) 1978   colon ca  . COPD (chronic obstructive pulmonary disease) ([place])   . GERD (gastroesophageal reflux disease)   . History of hiatal hernia     PAST SURGICAL HISTORY:  Past Surgical History:  Procedure Laterality Date  . ABDOMINAL HYSTERECTOMY    . APPENDECTOMY    . bowel obstruction    . CATARACT EXTRACTION W/ INTRAOCULAR LENS  IMPLANT, BILATERAL    . CHOLECYSTECTOMY    . COLON SURGERY     colon cancer  . COLONOSCOPY WITH PROPOFOL N/A 01/18/2018   Procedure: COLONOSCOPY WITH PROPOFOL;  Surgeon: Jonathon Bellows, MD;  Location: [facility] ENDOSCOPY;  Service: Gastroenterology;  Laterality:  N/A;  . CYSTOSCOPY W/ URETERAL STENT PLACEMENT Right 02/06/2018   Procedure: CYSTOSCOPY WITH RETROGRADE PYELOGRAM;  Surgeon: Hollice Espy, MD;  Location: [facility] ORS;  Service: Urology;  Laterality: Right;  . CYSTOSCOPY WITH URETEROSCOPY Right 02/06/2018   Procedure: CYSTOSCOPY WITH URETEROSCOPY;  Surgeon: Hollice Espy, MD;  Location: [facility] ORS;  Service: Urology;  Laterality: Right;  . PORTA CATH INSERTION N/A 02/11/2018   Procedure: PORTA CATH INSERTION;  Surgeon: Algernon Huxley, MD;  Location: [place] CV LAB;  Service: Cardiovascular;  Laterality: N/A;  . THYROID SURGERY     nodule removed  . THYROIDECTOMY    . TONSILLECTOMY    . URETERAL BIOPSY Right 02/06/2018   Procedure: URETERAL BIOPSY;  Surgeon: Hollice Espy, MD;  Location: [facility] ORS;  Service: Urology;  Laterality: Right;    HEMATOLOGY/ONCOLOGY HISTORY:    Urothelial carcinoma ([place])   02/12/2018 Initial Diagnosis    Urothelial carcinoma ([place])    02/14/2018 - 03/27/2018 Chemotherapy    The patient had atezolizumab (TECENTRIQ) 1,200 mg in sodium chloride 0.9 % 250 mL chemo infusion, 1,200 mg, Intravenous, Once, 2 of 6 cycles Administration: 1,200 mg (02/14/2018), 1,200 mg (03/07/2018)  for chemotherapy treatment.     03/28/2018 -  Chemotherapy    The patient had gemcitabine (GEMZAR) 1,300 mg in sodium chloride 0.9 % 250 mL chemo infusion, 1,292 mg (100 % of original dose 800 mg/m2), Intravenous,  Once, 4 of 4 cycles Dose modification: 800 mg/m2 (original dose 800 mg/m2, Cycle 1, Reason: Patient  Age) Administration: 1,300 mg (03/28/2018), 1,300 mg (04/04/2018), 1,300 mg (04/18/2018), 1,300 mg (05/02/2018), 1,300 mg (05/16/2018), 1,300 mg (05/30/2018)  for chemotherapy treatment.      ALLERGIES:  is allergic to demerol [meperidine] and sulfa antibiotics.  MEDICATIONS:  Current Outpatient Medications  Medication Sig Dispense Refill  . acetaminophen (TYLENOL) 325 MG tablet Take 325 mg by mouth every 6 (six) hours as needed.    Marland Kitchen  albuterol (PROVENTIL HFA;VENTOLIN HFA) 108 (90 Base) MCG/ACT inhaler Inhale into the lungs.    Marland Kitchen aspirin EC 81 MG tablet Take by mouth.    . budesonide (PULMICORT FLEXHALER) 180 MCG/ACT inhaler Inhale into the lungs.    . Cyanocobalamin 2500 MCG SUBL Place under the tongue.    . famotidine (PEPCID) 40 MG tablet Take by mouth.    . fluticasone (FLONASE) 50 MCG/ACT nasal spray USE 2 SPRAYS INTO BOTH     NOSTRILS NIGHTLY    . HYDROcodone-acetaminophen (NORCO/VICODIN) 5-325 MG tablet TK 1 T PO Q 6 H PRN  0  . mirabegron ER (MYRBETRIQ) 25 MG TB24 tablet Take 1 tablet (25 mg total) by mouth daily. 30 tablet 4  . montelukast (SINGULAIR) 10 MG tablet TAKE 1 TABLET DAILY    . Multiple Vitamin (MULTI-VITAMINS) TABS Take by mouth.    . Multiple Vitamins-Minerals (MULTIVITAMIN ADULT PO) Take by mouth.    . ondansetron (ZOFRAN) 8 MG tablet Take 1 tablet (8 mg total) by mouth 2 (two) times daily as needed (Nausea or vomiting). 30 tablet 2  . predniSONE (STERAPRED UNI-PAK 21 TAB) 10 MG (21) TBPK tablet Take as directed. 21 tablet 0  . prochlorperazine (COMPAZINE) 10 MG tablet Take 1 tablet (10 mg total) by mouth every 6 (six) hours as needed (Nausea or vomiting). 60 tablet 2  . ranitidine (ZANTAC) 300 MG tablet Take 1 tablet by mouth 1 day or 1 dose.    . tamsulosin (FLOMAX) 0.4 MG CAPS capsule Take by mouth.     No current facility-administered medications for this visit.    Facility-Administered Medications Ordered in Other Visits  Medication Dose Route Frequency Provider Last Rate Last Dose  . gemcitabine (GEMZAR) 1,300 mg in sodium chloride 0.9 % 250 mL chemo infusion  1,300 mg Intravenous Once Lloyd Huger, MD      . heparin lock flush 100 unit/mL  500 Units Intravenous Once Lloyd Huger, MD        VITAL SIGNS: LMP  (LMP Unknown)  There were no vitals filed for this visit.  Estimated body mass index is 25.13 kg/m as calculated from the following:   Height as of an earlier encounter  on 06/13/18: _0  (1.549 m).   Weight as of an earlier encounter on 06/13/18: 133 lb (60.3 kg).  LABS: CBC:    Component Value Date/Time   WBC 6.3 06/13/2018 0913   HGB 10.4 (L) 06/13/2018 0913   HGB 12.9 04/30/2013 2116   HCT 31.5 (L) 06/13/2018 0913   HCT 39.1 04/30/2013 2116   PLT 187 06/13/2018 0913   PLT 229 04/30/2013 2116   MCV 100.0 06/13/2018 0913   MCV 93 04/30/2013 2116   NEUTROABS 4.2 06/13/2018 0913   LYMPHSABS 1.1 06/13/2018 0913   MONOABS 0.8 06/13/2018 0913   EOSABS 0.2 06/13/2018 0913   BASOSABS 0.0 06/13/2018 0913   Comprehensive Metabolic Panel:    Component Value Date/Time   NA 138 06/13/2018 0913   NA 140 04/30/2013 2116   K 3.9 06/13/2018 0913   K  3.8 04/30/2013 2116   CL 108 06/13/2018 0913   CL 107 04/30/2013 2116   CO2 23 06/13/2018 0913   CO2 29 04/30/2013 2116   BUN 19 06/13/2018 0913   BUN 25 (H) 04/30/2013 2116   CREATININE 1.38 (H) 06/13/2018 0913   CREATININE 1.01 04/30/2013 2116   GLUCOSE 153 (H) 06/13/2018 0913   GLUCOSE 126 (H) 04/30/2013 2116   CALCIUM 8.9 06/13/2018 0913   CALCIUM 8.9 04/30/2013 2116   AST 35 06/13/2018 0913   ALT 22 06/13/2018 0913   ALKPHOS 125 06/13/2018 0913   BILITOT 0.4 06/13/2018 0913   PROT 6.1 (L) 06/13/2018 0913   ALBUMIN 3.3 (L) 06/13/2018 0913    RADIOGRAPHIC STUDIES: No results found.  PERFORMANCE STATUS (ECOG) : 1 - Symptomatic but completely ambulatory  Review of Systems As noted above. Otherwise, a complete review of systems is negative.  Physical Exam General: NAD, frail appearing, thin Pulmonary: unlabored ABD: soft, nttp, BS + Extremities: trace edema BLEs Skin: no rashes Neurological: Weakness but otherwise nonfocal  IMPRESSION: I met today with patient today for routine follow-up.  Patient reports doing reasonably well without significant changes or decline.  She had quarantined herself in her apartment at [facility] for 2 weeks due to recent travel son-in-law.  Patient is now  back living with her daughter.  We discussed COVID-19 and ways to mitigate infection including social distancing and and hygiene.  Symptomatically, patient denies pain or other distressing symptoms.  She does have chronic pedal edema but it improves with elevation of her feet.  Patient reports appetite is reasonably good.  Weight appears stable 133 pounds.  I called and spoke with daughter by phone.  She had no questions or concerns today.  Case discussed with Dr. Grayland Ormond.  PLAN: Treatment plan as outlined per oncology Continue supportive care RTC in 1 month   Patient expressed understanding and was in agreement with this plan. She also understands that She can call clinic at any time with any questions, concerns, or complaints.    Time Total: 15 minutes  Visit consisted of counseling and education dealing with the complex and emotionally intense issues of symptom management and palliative care in the setting of serious and potentially life-threatening illness.Greater than 50%  of this time was spent counseling and coordinating care related to the above assessment and plan.  Signed by: Altha Harm, PhD, NP-C [phone] (Work Cell)

## 2018-06-13 NOTE — Progress Notes (Signed)
[place]  [phone] Fax:(336) [phone]  ID: Kristi Thompson OB: 1923/06/20  MR#: [id number]  LKG#:[id number]  Patient Care Team: [name], MD as PCP - General (Internal Medicine) Clent Jacks, RN as Registered Nurse  CHIEF COMPLAINT: Urothelial carcinoma.  INTERVAL HISTORY: Patient returns to clinic today for further evaluation and consideration of cycle 4, day 1 of single agent gemcitabine.  She is tolerating her treatments well without significant side effects.  She currently feels well and is asymptomatic. Her lower extremity edema has improved wi th compression stockings and elevation. She has no neurologic complaints.  She denies any recent fevers or illnesses.  She has a good appetite and denies weight loss. She has no chest pain, shortness of breath, cough, or hemoptysis.  She denies any nausea, vomiting, constipation, or diarrhea.  She denies any melena or hematochezia.  She has no urinary complaints.  Patient offers no specific complaints today.  REVIEW OF SYSTEMS:   Review of Systems  Constitutional: Negative.  Negative for fever, malaise/fatigue and weight loss.  Respiratory: Negative.  Negative for cough, hemoptysis and shortness of breath.   Cardiovascular: Positive for leg swelling. Negative for chest pain.  Gastrointestinal: Negative.  Negative for abdominal pain, blood in stool, constipation, diarrhea, melena, nausea and vomiting.  Genitourinary: Negative.  Negative for dysuria and hematuria.  Musculoskeletal: Negative.  Negative for back pain.  Skin: Negative.  Negative for rash.  Neurological: Negative.  Negative for dizziness, focal weakness, weakness and headaches.  Psychiatric/Behavioral: Negative.  The patient is not nervous/anxious.     As per HPI. Otherwise, a complete review of systems is negative.  PAST MEDICAL HISTORY: Past Medical History:  Diagnosis Date  . Asthma   . Cancer ([place]) 1978   colon ca  . COPD  (chronic obstructive pulmonary disease) ([place])   . GERD (gastroesophageal reflux disease)   . History of hiatal hernia     PAST SURGICAL HISTORY: Past Surgical History:  Procedure Laterality Date  . ABDOMINAL HYSTERECTOMY    . APPENDECTOMY    . bowel obstruction    . CATARACT EXTRACTION W/ INTRAOCULAR LENS  IMPLANT, BILATERAL    . CHOLECYSTECTOMY    . COLON SURGERY     colon cancer  . COLONOSCOPY WITH PROPOFOL N/A 01/18/2018   Procedure: COLONOSCOPY WITH PROPOFOL;  Surgeon: Jonathon Bellows, MD;  Location: [facility] ENDOSCOPY;  Service: Gastroenterology;  Laterality: N/A;  . CYSTOSCOPY W/ URETERAL STENT PLACEMENT Right 02/06/2018   Procedure: CYSTOSCOPY WITH RETROGRADE PYELOGRAM;  Surgeon: Hollice Espy, MD;  Location: [facility] ORS;  Service: Urology;  Laterality: Right;  . CYSTOSCOPY WITH URETEROSCOPY Right 02/06/2018   Procedure: CYSTOSCOPY WITH URETEROSCOPY;  Surgeon: Hollice Espy, MD;  Location: [facility] ORS;  Service: Urology;  Laterality: Right;  . PORTA CATH INSERTION N/A 02/11/2018   Procedure: PORTA CATH INSERTION;  Surgeon: Algernon Huxley, MD;  Location: [place] CV LAB;  Service: Cardiovascular;  Laterality: N/A;  . THYROID SURGERY     nodule removed  . THYROIDECTOMY    . TONSILLECTOMY    . URETERAL BIOPSY Right 02/06/2018   Procedure: URETERAL BIOPSY;  Surgeon: Hollice Espy, MD;  Location: [facility] ORS;  Service: Urology;  Laterality: Right;    FAMILY HISTORY: Negative and noncontributory. History reviewed. No pertinent family history.  ADVANCED DIRECTIVES (Y/N):  N  HEALTH MAINTENANCE: Social History   Tobacco Use  . Smoking status: Never Smoker  . Smokeless tobacco: Never Used  Substance Use Topics  . Alcohol use:  Yes    Comment: rarely  . Drug use: No     Colonoscopy:  PAP:  Bone density:  Lipid panel:  Allergies  Allergen Reactions  . Demerol [Meperidine] Swelling  . Sulfa Antibiotics Swelling    Current Outpatient Medications  Medication Sig Dispense  Refill  . acetaminophen (TYLENOL) 325 MG tablet Take 325 mg by mouth every 6 (six) hours as needed.    Marland Kitchen albuterol (PROVENTIL HFA;VENTOLIN HFA) 108 (90 Base) MCG/ACT inhaler Inhale into the lungs.    Marland Kitchen aspirin EC 81 MG tablet Take by mouth.    . budesonide (PULMICORT FLEXHALER) 180 MCG/ACT inhaler Inhale into the lungs.    . Cyanocobalamin 2500 MCG SUBL Place under the tongue.    . famotidine (PEPCID) 40 MG tablet Take by mouth.    . fluticasone (FLONASE) 50 MCG/ACT nasal spray USE 2 SPRAYS INTO BOTH     NOSTRILS NIGHTLY    . HYDROcodone-acetaminophen (NORCO/VICODIN) 5-325 MG tablet TK 1 T PO Q 6 H PRN  0  . mirabegron ER (MYRBETRIQ) 25 MG TB24 tablet Take 1 tablet (25 mg total) by mouth daily. 30 tablet 4  . montelukast (SINGULAIR) 10 MG tablet TAKE 1 TABLET DAILY    . Multiple Vitamin (MULTI-VITAMINS) TABS Take by mouth.    . Multiple Vitamins-Minerals (MULTIVITAMIN ADULT PO) Take by mouth.    . ondansetron (ZOFRAN) 8 MG tablet Take 1 tablet (8 mg total) by mouth 2 (two) times daily as needed (Nausea or vomiting). 30 tablet 2  . predniSONE (STERAPRED UNI-PAK 21 TAB) 10 MG (21) TBPK tablet Take as directed. 21 tablet 0  . prochlorperazine (COMPAZINE) 10 MG tablet Take 1 tablet (10 mg total) by mouth every 6 (six) hours as needed (Nausea or vomiting). 60 tablet 2  . ranitidine (ZANTAC) 300 MG tablet Take 1 tablet by mouth 1 day or 1 dose.    . tamsulosin (FLOMAX) 0.4 MG CAPS capsule Take by mouth.     No current facility-administered medications for this visit.     OBJECTIVE: Vitals:   06/13/18 0937  BP: (!) 170/73  Pulse: 81  Temp: 97.9 F (36.6 C)     Body mass index is 25.13 kg/m.    ECOG FS:0 - Asymptomatic  General: Well-developed, well-nourished, no acute distress. Eyes: Pink conjunctiva, anicteric sclera. HEENT: Normocephalic, moist mucous membranes. Lungs: Clear to auscultation bilaterally. Heart: Regular rate and rhythm. No rubs, murmurs, or gallops. Abdomen: Soft,  nontender, nondistended. No organomegaly noted, normoactive bowel sounds. Musculoskeletal: No edema, cyanosis, or clubbing. Neuro: Alert, answering all questions appropriately. Cranial nerves grossly intact. Skin: No rashes or petechiae noted. Psych: Normal affect.  LAB RESULTS:  Lab Results  Component Value Date   NA 138 06/13/2018   K 3.9 06/13/2018   CL 108 06/13/2018   CO2 23 06/13/2018   GLUCOSE 153 (H) 06/13/2018   BUN 19 06/13/2018   CREATININE 1.38 (H) 06/13/2018   CALCIUM 8.9 06/13/2018   PROT 6.1 (L) 06/13/2018   ALBUMIN 3.3 (L) 06/13/2018   AST 35 06/13/2018   ALT 22 06/13/2018   ALKPHOS 125 06/13/2018   BILITOT 0.4 06/13/2018   GFRNONAA 33 (L) 06/13/2018   GFRAA 38 (L) 06/13/2018    Lab Results  Component Value Date   WBC 6.3 06/13/2018   NEUTROABS 4.2 06/13/2018   HGB 10.4 (L) 06/13/2018   HCT 31.5 (L) 06/13/2018   MCV 100.0 06/13/2018   PLT 187 06/13/2018     STUDIES: No results found.  ASSESSMENT: Urothelial carcinoma, PDL-1 0%.  PLAN:    1.  Urothelial carcinoma: Case discussed with urology.  Cystoscopy on February 06, 2018 revealed a right ureteral mass.  Cytology report with high-grade urothelial carcinoma consistent with previous biopsy of abdominal mass.  Unfortunately, patient's PDL-1 is 0% therefore Tecentriq may not offer much benefit.  Tecentriq has been discontinued.  Initial plan was to give gemcitabine on days 1, 8, and 15 with a 22 off, but given problems with thrombocytopenia and neutropenia she can only tolerate treatment on days 1 and 15.  Given her advanced age and decreased kidney function, hesitant to add cisplatin.  Proceed with cycle 4, day 1 of gemcitabine only today.  Return to clinic in 2 weeks for further evaluation and consideration of cycle 4, day 15.  Plan to reimage the conclusion of cycle 4. 2.  Hydronephrosis: Stent placement was not possible.  Nephrostomy tube was briefly discussed, but patient declined this at this time.  3.  Renal insufficiency: Patient's creatinine is approximately baseline at 1.38.  Monitor. Likely secondary to obstruction from tumor.  No nephrostomy tubes at this time.  No cisplatin as above.  Continue 1 L IV fluids with treatments.   4.  Anemia: Patient's hemoglobin continues to trend up and is now 10.4.  She last received IV Feraheme on May 16, 2018. 5.  Pain: Resolved. 6.  Lower extremity edema: Ultrasound was negative for DVT.  Continue compression stocking and elevation. 7.  Neutropenia: Resolved.  Proceed with treatment as above.   Patient expressed understanding and was in agreement with this plan. She also understands that She can call clinic at any time with any questions, concerns, or complaints.   Cancer Staging No matching staging information was found for the patient.  Lloyd Huger, MD   06/15/2018 7:03 AM

## 2018-06-26 ENCOUNTER — Other Ambulatory Visit: Payer: Self-pay

## 2018-06-27 ENCOUNTER — Inpatient Hospital Stay (HOSPITAL_BASED_OUTPATIENT_CLINIC_OR_DEPARTMENT_OTHER): Payer: Medicare Other | Admitting: Oncology

## 2018-06-27 ENCOUNTER — Encounter: Payer: Self-pay | Admitting: Oncology

## 2018-06-27 ENCOUNTER — Other Ambulatory Visit: Payer: Self-pay

## 2018-06-27 ENCOUNTER — Inpatient Hospital Stay: Payer: Medicare Other

## 2018-06-27 VITALS — BP 146/76 | HR 89 | Temp 97.5°F | Ht 61.0 in | Wt 130.0 lb

## 2018-06-27 DIAGNOSIS — D649 Anemia, unspecified: Secondary | ICD-10-CM

## 2018-06-27 DIAGNOSIS — Z79899 Other long term (current) drug therapy: Secondary | ICD-10-CM

## 2018-06-27 DIAGNOSIS — Z5111 Encounter for antineoplastic chemotherapy: Secondary | ICD-10-CM

## 2018-06-27 DIAGNOSIS — C779 Secondary and unspecified malignant neoplasm of lymph node, unspecified: Secondary | ICD-10-CM | POA: Diagnosis not present

## 2018-06-27 DIAGNOSIS — C689 Malignant neoplasm of urinary organ, unspecified: Secondary | ICD-10-CM

## 2018-06-27 DIAGNOSIS — R609 Edema, unspecified: Secondary | ICD-10-CM

## 2018-06-27 DIAGNOSIS — J449 Chronic obstructive pulmonary disease, unspecified: Secondary | ICD-10-CM

## 2018-06-27 DIAGNOSIS — K219 Gastro-esophageal reflux disease without esophagitis: Secondary | ICD-10-CM

## 2018-06-27 DIAGNOSIS — Z7982 Long term (current) use of aspirin: Secondary | ICD-10-CM

## 2018-06-27 DIAGNOSIS — Z95828 Presence of other vascular implants and grafts: Secondary | ICD-10-CM

## 2018-06-27 DIAGNOSIS — N1330 Unspecified hydronephrosis: Secondary | ICD-10-CM | POA: Diagnosis not present

## 2018-06-27 DIAGNOSIS — K449 Diaphragmatic hernia without obstruction or gangrene: Secondary | ICD-10-CM

## 2018-06-27 LAB — CBC WITH DIFFERENTIAL/PLATELET
Abs Immature Granulocytes: 0.03 10*3/uL (ref 0.00–0.07)
Basophils Absolute: 0 10*3/uL (ref 0.0–0.1)
Basophils Relative: 1 %
Eosinophils Absolute: 0.2 10*3/uL (ref 0.0–0.5)
Eosinophils Relative: 2 %
HCT: 31.8 % — ABNORMAL LOW (ref 36.0–46.0)
Hemoglobin: 10.7 g/dL — ABNORMAL LOW (ref 12.0–15.0)
Immature Granulocytes: 0 %
Lymphocytes Relative: 19 %
Lymphs Abs: 1.5 10*3/uL (ref 0.7–4.0)
MCH: 33.8 pg (ref 26.0–34.0)
MCHC: 33.6 g/dL (ref 30.0–36.0)
MCV: 100.3 fL — ABNORMAL HIGH (ref 80.0–100.0)
Monocytes Absolute: 0.8 10*3/uL (ref 0.1–1.0)
Monocytes Relative: 11 %
Neutro Abs: 5.3 10*3/uL (ref 1.7–7.7)
Neutrophils Relative %: 67 %
Platelets: 185 10*3/uL (ref 150–400)
RBC: 3.17 MIL/uL — ABNORMAL LOW (ref 3.87–5.11)
RDW: 18.9 % — ABNORMAL HIGH (ref 11.5–15.5)
WBC: 7.9 10*3/uL (ref 4.0–10.5)
nRBC: 0 % (ref 0.0–0.2)

## 2018-06-27 LAB — COMPREHENSIVE METABOLIC PANEL
ALT: 29 U/L (ref 0–44)
AST: 39 U/L (ref 15–41)
Albumin: 3.1 g/dL — ABNORMAL LOW (ref 3.5–5.0)
Alkaline Phosphatase: 103 U/L (ref 38–126)
Anion gap: 8 (ref 5–15)
BUN: 31 mg/dL — ABNORMAL HIGH (ref 8–23)
CO2: 19 mmol/L — ABNORMAL LOW (ref 22–32)
Calcium: 8.6 mg/dL — ABNORMAL LOW (ref 8.9–10.3)
Chloride: 110 mmol/L (ref 98–111)
Creatinine, Ser: 1.69 mg/dL — ABNORMAL HIGH (ref 0.44–1.00)
GFR calc Af Amer: 30 mL/min — ABNORMAL LOW (ref >60)
GFR calc non Af Amer: 26 mL/min — ABNORMAL LOW (ref >60)
Glucose, Bld: 146 mg/dL — ABNORMAL HIGH (ref 70–99)
Potassium: 4 mmol/L (ref 3.5–5.1)
Sodium: 137 mmol/L (ref 135–145)
Total Bilirubin: 0.6 mg/dL (ref 0.3–1.2)
Total Protein: 6.1 g/dL — ABNORMAL LOW (ref 6.5–8.1)

## 2018-06-27 MED ORDER — PROCHLORPERAZINE MALEATE 10 MG PO TABS
10.0000 mg | ORAL_TABLET | Freq: Once | ORAL | Status: AC
  Administered 2018-06-27: 11:00:00 10 mg via ORAL
  Filled 2018-06-27: qty 1

## 2018-06-27 MED ORDER — SODIUM CHLORIDE 0.9% FLUSH
10.0000 mL | Freq: Once | INTRAVENOUS | Status: AC
  Administered 2018-06-27: 10 mL via INTRAVENOUS
  Filled 2018-06-27: qty 10

## 2018-06-27 MED ORDER — SODIUM CHLORIDE 0.9 % IV SOLN
Freq: Once | INTRAVENOUS | Status: AC
  Administered 2018-06-27: 10:00:00 via INTRAVENOUS
  Filled 2018-06-27: qty 250

## 2018-06-27 MED ORDER — HEPARIN SOD (PORK) LOCK FLUSH 100 UNIT/ML IV SOLN
500.0000 [IU] | Freq: Once | INTRAVENOUS | Status: AC | PRN
  Administered 2018-06-27: 12:00:00 500 [IU]
  Filled 2018-06-27: qty 5

## 2018-06-27 MED ORDER — SODIUM CHLORIDE 0.9 % IV SOLN
Freq: Once | INTRAVENOUS | Status: AC
  Administered 2018-06-27: 10:00:00 1000 mL via INTRAVENOUS
  Filled 2018-06-27: qty 250

## 2018-06-27 MED ORDER — SODIUM CHLORIDE 0.9 % IV SOLN
1300.0000 mg | Freq: Once | INTRAVENOUS | Status: AC
  Administered 2018-06-27: 12:00:00 1300 mg via INTRAVENOUS
  Filled 2018-06-27: qty 26.3

## 2018-06-27 NOTE — Progress Notes (Signed)
Creatinine: 1.69. MD, Dr. Grayland Ormond, notified and already aware. Per MD order: proceed with scheduled Gemzar treatment today.

## 2018-06-27 NOTE — Progress Notes (Signed)
Patient stated that she had been doing well with no complaints. 

## 2018-06-27 NOTE — Progress Notes (Signed)
[place]  Telephone:(336) [phone] Fax:(336) [phone]  ID: [name] OB: [birth year]  MR#: [id number]  VEH#:[id number]  Patient Care Team: [name], MD as PCP - General (Internal Medicine) Clent Jacks, RN as Registered Nurse  CHIEF COMPLAINT: Urothelial carcinoma.  INTERVAL HISTORY: Patient returns to clinic today for further evaluation and consideration of cycle 4, day 15 of single agent gemcitabine.  She continues to tolerate her treatments well without significant side effects.  She currently feels well and is asymptomatic. Her lower extremity edema has improved wi th compression stockings and elevation. She has no neurologic complaints.  She denies any recent fevers or illnesses.  She has a good appetite and denies weight loss. She has no chest pain, shortness of breath, cough, or hemoptysis.  She denies any nausea, vomiting, constipation, or diarrhea.  She denies any melena or hematochezia.  She has no urinary complaints.  Patient feels at her baseline offers no specific complaints today.  REVIEW OF SYSTEMS:   Review of Systems  Constitutional: Negative.  Negative for fever, malaise/fatigue and weight loss.  Respiratory: Negative.  Negative for cough, hemoptysis and shortness of breath.   Cardiovascular: Positive for leg swelling. Negative for chest pain.  Gastrointestinal: Negative.  Negative for abdominal pain, blood in stool, constipation, diarrhea, melena, nausea and vomiting.  Genitourinary: Negative.  Negative for dysuria and hematuria.  Musculoskeletal: Negative.  Negative for back pain.  Skin: Negative.  Negative for rash.  Neurological: Negative.  Negative for dizziness, focal weakness, weakness and headaches.  Psychiatric/Behavioral: Negative.  The patient is not nervous/anxious.     As per HPI. Otherwise, a complete review of systems is negative.  PAST MEDICAL HISTORY: Past Medical History:  Diagnosis Date  . Asthma   . Cancer  ([place]) 1978   colon ca  . COPD (chronic obstructive pulmonary disease) ([place])   . GERD (gastroesophageal reflux disease)   . History of hiatal hernia     PAST SURGICAL HISTORY: Past Surgical History:  Procedure Laterality Date  . ABDOMINAL HYSTERECTOMY    . APPENDECTOMY    . bowel obstruction    . CATARACT EXTRACTION W/ INTRAOCULAR LENS  IMPLANT, BILATERAL    . CHOLECYSTECTOMY    . COLON SURGERY     colon cancer  . COLONOSCOPY WITH PROPOFOL N/A 01/18/2018   Procedure: COLONOSCOPY WITH PROPOFOL;  Surgeon: Jonathon Bellows, MD;  Location: [facility] ENDOSCOPY;  Service: Gastroenterology;  Laterality: N/A;  . CYSTOSCOPY W/ URETERAL STENT PLACEMENT Right 02/06/2018   Procedure: CYSTOSCOPY WITH RETROGRADE PYELOGRAM;  Surgeon: Hollice Espy, MD;  Location: [facility] ORS;  Service: Urology;  Laterality: Right;  . CYSTOSCOPY WITH URETEROSCOPY Right 02/06/2018   Procedure: CYSTOSCOPY WITH URETEROSCOPY;  Surgeon: Hollice Espy, MD;  Location: [facility] ORS;  Service: Urology;  Laterality: Right;  . PORTA CATH INSERTION N/A 02/11/2018   Procedure: PORTA CATH INSERTION;  Surgeon: Algernon Huxley, MD;  Location: [place] CV LAB;  Service: Cardiovascular;  Laterality: N/A;  . THYROID SURGERY     nodule removed  . THYROIDECTOMY    . TONSILLECTOMY    . URETERAL BIOPSY Right 02/06/2018   Procedure: URETERAL BIOPSY;  Surgeon: Hollice Espy, MD;  Location: [facility] ORS;  Service: Urology;  Laterality: Right;    FAMILY HISTORY: Negative and noncontributory. History reviewed. No pertinent family history.  ADVANCED DIRECTIVES (Y/N):  N  HEALTH MAINTENANCE: Social History   Tobacco Use  . Smoking status: Never Smoker  . Smokeless tobacco: Never Used  Substance Use  Topics  . Alcohol use: Yes    Comment: rarely  . Drug use: No     Colonoscopy:  PAP:  Bone density:  Lipid panel:  Allergies  Allergen Reactions  . Demerol [Meperidine] Swelling  . Sulfa Antibiotics Swelling    Current Outpatient  Medications  Medication Sig Dispense Refill  . acetaminophen (TYLENOL) 325 MG tablet Take 325 mg by mouth every 6 (six) hours as needed.    Marland Kitchen albuterol (PROVENTIL HFA;VENTOLIN HFA) 108 (90 Base) MCG/ACT inhaler Inhale into the lungs.    Marland Kitchen aspirin EC 81 MG tablet Take by mouth.    . Cyanocobalamin 2500 MCG SUBL Place under the tongue.    . famotidine (PEPCID) 40 MG tablet Take by mouth.    . fluticasone (FLONASE) 50 MCG/ACT nasal spray USE 2 SPRAYS INTO BOTH     NOSTRILS NIGHTLY    . HYDROcodone-acetaminophen (NORCO/VICODIN) 5-325 MG tablet TK 1 T PO Q 6 H PRN  0  . mirabegron ER (MYRBETRIQ) 25 MG TB24 tablet Take 1 tablet (25 mg total) by mouth daily. 30 tablet 4  . montelukast (SINGULAIR) 10 MG tablet TAKE 1 TABLET DAILY    . Multiple Vitamin (MULTI-VITAMINS) TABS Take by mouth.    . Multiple Vitamins-Minerals (MULTIVITAMIN ADULT PO) Take by mouth.    . ondansetron (ZOFRAN) 8 MG tablet Take 1 tablet (8 mg total) by mouth 2 (two) times daily as needed (Nausea or vomiting). 30 tablet 2  . predniSONE (STERAPRED UNI-PAK 21 TAB) 10 MG (21) TBPK tablet Take as directed. 21 tablet 0  . prochlorperazine (COMPAZINE) 10 MG tablet Take 1 tablet (10 mg total) by mouth every 6 (six) hours as needed (Nausea or vomiting). 60 tablet 2  . ranitidine (ZANTAC) 300 MG tablet Take 1 tablet by mouth 1 day or 1 dose.    . tamsulosin (FLOMAX) 0.4 MG CAPS capsule Take by mouth.    . budesonide (PULMICORT FLEXHALER) 180 MCG/ACT inhaler Inhale into the lungs.     No current facility-administered medications for this visit.     OBJECTIVE: Vitals:   06/27/18 0924  BP: (!) 146/76  Pulse: 89  Temp: (!) 97.5 F (36.4 C)     Body mass index is 24.56 kg/m.    ECOG FS:0 - Asymptomatic  General: Well-developed, well-nourished, no acute distress. Eyes: Pink conjunctiva, anicteric sclera. HEENT: Normocephalic, moist mucous membranes. Lungs: Clear to auscultation bilaterally. Heart: Regular rate and rhythm. No rubs,  murmurs, or gallops. Abdomen: Soft, nontender, nondistended. No organomegaly noted, normoactive bowel sounds. Musculoskeletal: No edema, cyanosis, or clubbing. Neuro: Alert, answering all questions appropriately. Cranial nerves grossly intact. Skin: No rashes or petechiae noted. Psych: Normal affect.  LAB RESULTS:  Lab Results  Component Value Date   NA 137 06/27/2018   K 4.0 06/27/2018   CL 110 06/27/2018   CO2 19 (L) 06/27/2018   GLUCOSE 146 (H) 06/27/2018   BUN 31 (H) 06/27/2018   CREATININE 1.69 (H) 06/27/2018   CALCIUM 8.6 (L) 06/27/2018   PROT 6.1 (L) 06/27/2018   ALBUMIN 3.1 (L) 06/27/2018   AST 39 06/27/2018   ALT 29 06/27/2018   ALKPHOS 103 06/27/2018   BILITOT 0.6 06/27/2018   GFRNONAA 26 (L) 06/27/2018   GFRAA 30 (L) 06/27/2018    Lab Results  Component Value Date   WBC 7.9 06/27/2018   NEUTROABS 5.3 06/27/2018   HGB 10.7 (L) 06/27/2018   HCT 31.8 (L) 06/27/2018   MCV 100.3 (H) 06/27/2018   PLT 185  06/27/2018     STUDIES: No results found.  ASSESSMENT: Urothelial carcinoma, PDL-1 0%.  PLAN:    1.  Urothelial carcinoma: Case discussed with urology.  Cystoscopy on February 06, 2018 revealed a right ureteral mass.  Cytology report with high-grade urothelial carcinoma consistent with previous biopsy of abdominal mass.  Unfortunately, patient's PDL-1 is 0% therefore Tecentriq may not offer much benefit.  Tecentriq has been discontinued.  Initial plan was to give gemcitabine on days 1, 8, and 15 with a 22 off, but given problems with thrombocytopenia and neutropenia she can only tolerate treatment on days 1 and 15.  Given her advanced age and decreased kidney function, hesitant to add cisplatin.  Proceed with cycle 4, day 15 of gemcitabine only today.  Return to clinic in 2 weeks for further evaluation and consideration of cycle 5, day 1.  Will reimage with PET scan prior to next infusion.   2.  Hydronephrosis: Stent placement was not possible.  Nephrostomy tube  was briefly discussed, but patient declined this at this time. 3.  Renal insufficiency: Patient's creatinine has trended up slightly to 1.69.  Encourage increased fluid intake.  Monitor. Likely secondary to obstruction from tumor.  No nephrostomy tubes at this time.  No cisplatin as above.  Continue 1 L IV fluids with treatments.   4.  Anemia: Patient's hemoglobin is decreased, but stable at 10.7.  She last received IV Feraheme on May 16, 2018. 5.  Pain: Resolved. 6.  Lower extremity edema: Ultrasound was negative for DVT.  Continue compression stocking and elevation. 7.  Neutropenia: Resolved.  Proceed with treatment as above.   Patient expressed understanding and was in agreement with this plan. She also understands that She can call clinic at any time with any questions, concerns, or complaints.   Cancer Staging No matching staging information was found for the patient.  Lloyd Huger, MD   06/27/2018 1:13 PM

## 2018-07-09 ENCOUNTER — Other Ambulatory Visit: Payer: Self-pay

## 2018-07-09 ENCOUNTER — Ambulatory Visit
Admission: RE | Admit: 2018-07-09 | Discharge: 2018-07-09 | Disposition: A | Discharge status: Home / Self Care | Payer: Medicare Other | Source: Ambulatory Visit | Attending: Oncology | Admitting: Oncology

## 2018-07-09 DIAGNOSIS — C689 Malignant neoplasm of urinary organ, unspecified: Secondary | ICD-10-CM | POA: Diagnosis not present

## 2018-07-09 DIAGNOSIS — R1900 Intra-abdominal and pelvic swelling, mass and lump, unspecified site: Secondary | ICD-10-CM | POA: Insufficient documentation

## 2018-07-09 DIAGNOSIS — J90 Pleural effusion, not elsewhere classified: Secondary | ICD-10-CM | POA: Diagnosis not present

## 2018-07-09 DIAGNOSIS — R591 Generalized enlarged lymph nodes: Secondary | ICD-10-CM | POA: Insufficient documentation

## 2018-07-09 HISTORY — DX: Malignant neoplasm of urinary organ, unspecified: C68.9

## 2018-07-09 LAB — GLUCOSE, CAPILLARY: Glucose-Capillary: 78 mg/dL (ref 70–99)

## 2018-07-09 MED ORDER — FLUDEOXYGLUCOSE F - 18 (FDG) INJECTION
7.1200 | Freq: Once | INTRAVENOUS | Status: AC | PRN
  Administered 2018-07-09: 09:00:00 7.12 via INTRAVENOUS

## 2018-07-09 NOTE — Progress Notes (Signed)
[place]  Telephone:(336) [phone] Fax:(336) [phone]  ID: Kristi Thompson OB: 22-May-1923  MR#: [id number]  XBL#:[id number]  Patient Care Team: [name], MD as PCP - General (Internal Medicine) Clent Jacks, RN as Registered Nurse  CHIEF COMPLAINT: Urothelial carcinoma.  INTERVAL HISTORY: Patient returns to clinic today for further evaluation, discussion of her imaging results, and consideration of cycle 5, day 1 of single agent gemcitabine.  She has noticed increased weakness and fatigue, but otherwise feels well.  She continues to tolerate her treatments well without significant side effects.  She has increased constipation.  She has no neurologic complaints.  She denies any recent fevers or illnesses.  She has a good appetite and denies weight loss. She has no chest pain, shortness of breath, cough, or hemoptysis.  She denies any nausea, vomiting, or diarrhea.  She denies any melena or hematochezia.  She has no urinary complaints.  Patient offers no further specific complaints today.  REVIEW OF SYSTEMS:   Review of Systems  Constitutional: Positive for malaise/fatigue. Negative for fever and weight loss.  Respiratory: Negative.  Negative for cough, hemoptysis and shortness of breath.   Cardiovascular: Negative.  Negative for chest pain and leg swelling.  Gastrointestinal: Positive for constipation. Negative for abdominal pain, blood in stool, diarrhea, melena, nausea and vomiting.  Genitourinary: Negative.  Negative for dysuria and hematuria.  Musculoskeletal: Negative.  Negative for back pain.  Skin: Negative.  Negative for rash.  Neurological: Positive for weakness. Negative for dizziness, focal weakness and headaches.  Psychiatric/Behavioral: Negative.  The patient is not nervous/anxious.     As per HPI. Otherwise, a complete review of systems is negative.  PAST MEDICAL HISTORY: Past Medical History:  Diagnosis Date   Asthma    Cancer ([place])  1978   colon ca   COPD (chronic obstructive pulmonary disease) ([facility])    GERD (gastroesophageal reflux disease)    History of hiatal hernia    Urothelial cancer ([place]) 2019   Urothelial carcinoma    PAST SURGICAL HISTORY: Past Surgical History:  Procedure Laterality Date   ABDOMINAL HYSTERECTOMY     APPENDECTOMY     bowel obstruction     CATARACT EXTRACTION W/ INTRAOCULAR LENS  IMPLANT, BILATERAL     CHOLECYSTECTOMY     COLON SURGERY     colon cancer   COLONOSCOPY WITH PROPOFOL N/A 01/18/2018   Procedure: COLONOSCOPY WITH PROPOFOL;  Surgeon: Jonathon Bellows, MD;  Location: [facility] ENDOSCOPY;  Service: Gastroenterology;  Laterality: N/A;   CYSTOSCOPY W/ URETERAL STENT PLACEMENT Right 02/06/2018   Procedure: CYSTOSCOPY WITH RETROGRADE PYELOGRAM;  Surgeon: Hollice Espy, MD;  Location: [facility] ORS;  Service: Urology;  Laterality: Right;   CYSTOSCOPY WITH URETEROSCOPY Right 02/06/2018   Procedure: CYSTOSCOPY WITH URETEROSCOPY;  Surgeon: Hollice Espy, MD;  Location: [facility] ORS;  Service: Urology;  Laterality: Right;   PORTA CATH INSERTION N/A 02/11/2018   Procedure: PORTA CATH INSERTION;  Surgeon: Algernon Huxley, MD;  Location: [place] CV LAB;  Service: Cardiovascular;  Laterality: N/A;   THYROID SURGERY     nodule removed   THYROIDECTOMY     TONSILLECTOMY     URETERAL BIOPSY Right 02/06/2018   Procedure: URETERAL BIOPSY;  Surgeon: Hollice Espy, MD;  Location: [facility] ORS;  Service: Urology;  Laterality: Right;    FAMILY HISTORY: Negative and noncontributory. History reviewed. No pertinent family history.  ADVANCED DIRECTIVES (Y/N):  N  HEALTH MAINTENANCE: Social History   Tobacco Use   Smoking status: Never  Smoker   Smokeless tobacco: Never Used  Substance Use Topics   Alcohol use: Yes    Comment: rarely   Drug use: No     Colonoscopy:  PAP:  Bone density:  Lipid panel:  Allergies  Allergen Reactions   Demerol [Meperidine] Swelling   Sulfa  Antibiotics Swelling    Current Outpatient Medications  Medication Sig Dispense Refill   acetaminophen (TYLENOL) 325 MG tablet Take 325 mg by mouth every 6 (six) hours as needed.     albuterol (PROVENTIL HFA;VENTOLIN HFA) 108 (90 Base) MCG/ACT inhaler Inhale into the lungs.     aspirin EC 81 MG tablet Take by mouth.     Cyanocobalamin 2500 MCG SUBL Place under the tongue.     famotidine (PEPCID) 40 MG tablet Take by mouth.     fluticasone (FLONASE) 50 MCG/ACT nasal spray USE 2 SPRAYS INTO BOTH     NOSTRILS NIGHTLY     HYDROcodone-acetaminophen (NORCO/VICODIN) 5-325 MG tablet TK 1 T PO Q 6 H PRN  0   mirabegron ER (MYRBETRIQ) 25 MG TB24 tablet Take 1 tablet (25 mg total) by mouth daily. 30 tablet 4   montelukast (SINGULAIR) 10 MG tablet TAKE 1 TABLET DAILY     Multiple Vitamin (MULTI-VITAMINS) TABS Take by mouth.     Multiple Vitamins-Minerals (MULTIVITAMIN ADULT PO) Take by mouth.     ondansetron (ZOFRAN) 8 MG tablet Take 1 tablet (8 mg total) by mouth 2 (two) times daily as needed (Nausea or vomiting). 30 tablet 2   predniSONE (STERAPRED UNI-PAK 21 TAB) 10 MG (21) TBPK tablet Take as directed. 21 tablet 0   prochlorperazine (COMPAZINE) 10 MG tablet Take 1 tablet (10 mg total) by mouth every 6 (six) hours as needed (Nausea or vomiting). 60 tablet 2   ranitidine (ZANTAC) 300 MG tablet Take 1 tablet by mouth 1 day or 1 dose.     tamsulosin (FLOMAX) 0.4 MG CAPS capsule Take by mouth.     budesonide (PULMICORT FLEXHALER) 180 MCG/ACT inhaler Inhale into the lungs.     No current facility-administered medications for this visit.    Facility-Administered Medications Ordered in Other Visits  Medication Dose Route Frequency Provider Last Rate Last Dose   0.9 %  sodium chloride infusion   Intravenous Once Grayland Ormond, Kathlene November, MD       gemcitabine (GEMZAR) 1,300 mg in sodium chloride 0.9 % 250 mL chemo infusion  1,300 mg Intravenous Once Lloyd Huger, MD       heparin  lock flush 100 unit/mL  500 Units Intravenous Once Lloyd Huger, MD       sodium chloride flush (NS) 0.9 % injection 10 mL  10 mL Intravenous PRN Lloyd Huger, MD   10 mL at 07/11/18 1572    OBJECTIVE: There were no vitals filed for this visit.   There is no height or weight on file to calculate BMI.    ECOG FS:0 - Asymptomatic  General: Well-developed, well-nourished, no acute distress. Eyes: Pink conjunctiva, anicteric sclera. HEENT: Normocephalic, moist mucous membranes. Lungs: Clear to auscultation bilaterally. Heart: Regular rate and rhythm. No rubs, murmurs, or gallops. Abdomen: Soft, nontender, nondistended. No organomegaly noted, normoactive bowel sounds. Musculoskeletal: No edema, cyanosis, or clubbing. Neuro: Alert, answering all questions appropriately. Cranial nerves grossly intact. Skin: No rashes or petechiae noted. Psych: Normal affect.  LAB RESULTS:  Lab Results  Component Value Date   NA 140 07/11/2018   K 4.3 07/11/2018   CL 113 (H)  07/11/2018   CO2 19 (L) 07/11/2018   GLUCOSE 168 (H) 07/11/2018   BUN 41 (H) 07/11/2018   CREATININE 1.99 (H) 07/11/2018   CALCIUM 8.4 (L) 07/11/2018   PROT 5.5 (L) 07/11/2018   ALBUMIN 2.8 (L) 07/11/2018   AST 41 07/11/2018   ALT 34 07/11/2018   ALKPHOS 91 07/11/2018   BILITOT 0.7 07/11/2018   GFRNONAA 21 (L) 07/11/2018   GFRAA 24 (L) 07/11/2018    Lab Results  Component Value Date   WBC 5.6 07/11/2018   NEUTROABS 3.9 07/11/2018   HGB 8.7 (L) 07/11/2018   HCT 26.0 (L) 07/11/2018   MCV 104.0 (H) 07/11/2018   PLT 151 07/11/2018     STUDIES: Nm Pet Image Restag (ps) Skull Base To Thigh  Result Date: 07/09/2018 CLINICAL DATA:  Subsequent treatment strategy for urothelial carcinoma of the right ureter. Also with history of colon cancer. EXAM: NUCLEAR MEDICINE PET SKULL BASE TO THIGH TECHNIQUE: 7.1 mCi F-18 FDG was injected intravenously. Full-ring PET imaging was performed from the skull base to thigh  after the radiotracer. CT data was obtained and used for attenuation correction and anatomic localization. Fasting blood glucose: 78 mg/dl COMPARISON:  01/04/2018. FINDINGS: Mediastinal blood pool activity: SUV max 2.0 NECK: No hypermetabolic lymphadenopathy. Relatively diffuse FDG accumulation in the thyroid gland is similar to prior. Incidental CT findings: none CHEST: 11 mm right paratracheal lymph has increased from 9 mm short axis previously. Low level FDG accumulation in this node is minimally above blood pool at 2.8. Small prevascular nodes are stable. Low level uptake in each hilum is unchanged. Incidental CT findings: Right Port-A-Cath tip is positioned at the SVC/RA junction. Coronary artery calcification is evident. Atherosclerotic calcification is noted in the wall of the thoracic aorta. No suspicious pulmonary nodule or mass. Mosaic attenuation in the lungs likely reflects air trapping. Small bilateral pleural effusions are progressive in the interval. ABDOMEN/PELVIS: Stable abnormal appearance of the mid transverse colon, markedly hypermetabolic with SUV max = 88.5 today compared to 24.8 previously. Right hydroureteronephrosis persists. The abnormal hypermetabolism seen in the region of the mid right ureter is similar with SUV max = 9 today compared to 9.5 previously. The soft tissue lesion along the distal right ureter/right vaginal cuff is again noted to contain central calcification. Lesion measures 3.8 x 3.1 cm today compared to 3.8 x 3.6 cm previously. SUV max = 14.3 today compared to 11.7 previously. The right retrocaval lymph node measured previously at 2.1 cm is now 0.7 cm with no measurable hypermetabolism on today's study above background blood pool levels. Aortocaval lymph node measured previously at 2 cm short axis is now 0.8 cm without hypermetabolism. Right common iliac lymph node measured previously at 1.4 cm is now 0.8 cm. Hypermetabolism from the adjacent ureteral mass obscures is  lymph node on PET imaging. The hypermetabolic nodule seen previously in the right paracolic gutter have resolved in the interval. Nodular areas of uptake are identified and central small bowel loops on today's study, nonspecific. Incidental CT findings: Interval progression of diffuse body wall edema. Asymmetric subcutaneous edema is identified in the right thigh. Aortic Atherosclerois (ICD10-170.0) SKELETON: No focal hypermetabolic activity to suggest skeletal metastasis. Incidental CT findings: none IMPRESSION: 1. Similar right-sided hydroureteronephrosis with no substantial change in the hypermetabolic lesion of the mid right ureter. 2. Similar appearance of the hypermetabolic transverse colon mass. 3. Retroperitoneal lymphadenopathy in the right abdomen with the right pelvic sidewall lymphadenopathy described on the previous study has resolved based on size  with no residual hypermetabolism evident today, consistent with response to therapy. 4. Calcified right pelvic mass along the distal right ureter and right vaginal cuff is stable in size and minimally progressed in hypermetabolism. 5. Interval resolution of the hypermetabolic nodules in the right paracolic gutter. 6. Interval progression of small bilateral pleural effusions. 7. Interval progression of body wall edema including new asymmetric edema in the right thigh. No evidence for DVT on lower extremity ultrasound of 04/11/2018, but given persistent right pelvic sidewall disease today repeat assessment may be warranted. 8. Similar appearance of diffuse uptake in the thyroid parenchyma. 9.  Aortic Atherosclerois (ICD10-170.0) Electronically Signed   By: Misty Stanley M.D.   On: 07/09/2018 12:20    ASSESSMENT: Urothelial carcinoma, PDL-1 0%.  PLAN:    1.  Urothelial carcinoma: Case discussed with urology.  Cystoscopy on February 06, 2018 revealed a right ureteral mass.  Cytology report with high-grade urothelial carcinoma consistent with previous  biopsy of abdominal mass.  Unfortunately, patient's PDL-1 is 0% therefore Tecentriq may not offer much benefit.  Tecentriq has been discontinued.  Initial plan was to give gemcitabine on days 1, 8, and 15 with a 22 off, but given problems with thrombocytopenia and neutropenia she can only tolerate treatment on days 1 and 15.  Given her advanced age and decreased kidney function, hesitant to add cisplatin.  PET scan results from July 09, 2018 reviewed independently and report as above with significant improvement of disease burden with near resolution of PET positive lymphadenopathy, but patient likely continues to have residual disease.  Proceed with cycle 5, day 1 of gemcitabine only today.  Return to clinic in 2 weeks for further evaluation and consideration of cycle 5, day 15.   2.  Hydronephrosis: Stent placement was not possible.  Nephrostomy tube was briefly discussed, but patient declined this at this time. 3.  Renal insufficiency: Patient's creatinine has trended up slightly to 1.99.  Encourage increased fluid intake.  Monitor. Likely secondary to obstruction from tumor.  No nephrostomy tubes at this time.  No cisplatin as above.  Continue 1 L IV fluids with treatments.   4.  Anemia: Patient's hemoglobin has trended down slightly to 8.7.  She last received IV Feraheme on May 16, 2018. 5.  Pain: Resolved. 6.  Lower extremity edema: Ultrasound was negative for DVT.  Continue compression stocking and elevation. 7.  Neutropenia: Resolved.  Proceed with treatment as above.   Patient expressed understanding and was in agreement with this plan. She also understands that She can call clinic at any time with any questions, concerns, or complaints.   Cancer Staging No matching staging information was found for the patient.  Lloyd Huger, MD   07/11/2018 11:46 AM

## 2018-07-10 ENCOUNTER — Other Ambulatory Visit: Payer: Self-pay

## 2018-07-11 ENCOUNTER — Inpatient Hospital Stay: Payer: Medicare Other

## 2018-07-11 ENCOUNTER — Other Ambulatory Visit: Payer: Self-pay

## 2018-07-11 ENCOUNTER — Encounter: Payer: Self-pay | Admitting: Oncology

## 2018-07-11 ENCOUNTER — Inpatient Hospital Stay (HOSPITAL_BASED_OUTPATIENT_CLINIC_OR_DEPARTMENT_OTHER): Payer: Medicare Other | Admitting: Oncology

## 2018-07-11 DIAGNOSIS — Z5111 Encounter for antineoplastic chemotherapy: Secondary | ICD-10-CM | POA: Diagnosis not present

## 2018-07-11 DIAGNOSIS — Z79899 Other long term (current) drug therapy: Secondary | ICD-10-CM

## 2018-07-11 DIAGNOSIS — R609 Edema, unspecified: Secondary | ICD-10-CM

## 2018-07-11 DIAGNOSIS — D649 Anemia, unspecified: Secondary | ICD-10-CM

## 2018-07-11 DIAGNOSIS — C689 Malignant neoplasm of urinary organ, unspecified: Secondary | ICD-10-CM | POA: Diagnosis not present

## 2018-07-11 DIAGNOSIS — C779 Secondary and unspecified malignant neoplasm of lymph node, unspecified: Secondary | ICD-10-CM

## 2018-07-11 DIAGNOSIS — N1330 Unspecified hydronephrosis: Secondary | ICD-10-CM

## 2018-07-11 DIAGNOSIS — K449 Diaphragmatic hernia without obstruction or gangrene: Secondary | ICD-10-CM

## 2018-07-11 DIAGNOSIS — K219 Gastro-esophageal reflux disease without esophagitis: Secondary | ICD-10-CM

## 2018-07-11 DIAGNOSIS — J449 Chronic obstructive pulmonary disease, unspecified: Secondary | ICD-10-CM

## 2018-07-11 DIAGNOSIS — Z7982 Long term (current) use of aspirin: Secondary | ICD-10-CM

## 2018-07-11 LAB — CBC WITH DIFFERENTIAL/PLATELET
Abs Immature Granulocytes: 0.04 10*3/uL (ref 0.00–0.07)
Basophils Absolute: 0 10*3/uL (ref 0.0–0.1)
Basophils Relative: 1 %
Eosinophils Absolute: 0.1 10*3/uL (ref 0.0–0.5)
Eosinophils Relative: 3 %
HCT: 26 % — ABNORMAL LOW (ref 36.0–46.0)
Hemoglobin: 8.7 g/dL — ABNORMAL LOW (ref 12.0–15.0)
Immature Granulocytes: 1 %
Lymphocytes Relative: 14 %
Lymphs Abs: 0.8 10*3/uL (ref 0.7–4.0)
MCH: 34.8 pg — ABNORMAL HIGH (ref 26.0–34.0)
MCHC: 33.5 g/dL (ref 30.0–36.0)
MCV: 104 fL — ABNORMAL HIGH (ref 80.0–100.0)
Monocytes Absolute: 0.7 10*3/uL (ref 0.1–1.0)
Monocytes Relative: 13 %
Neutro Abs: 3.9 10*3/uL (ref 1.7–7.7)
Neutrophils Relative %: 68 %
Platelets: 151 10*3/uL (ref 150–400)
RBC: 2.5 MIL/uL — ABNORMAL LOW (ref 3.87–5.11)
RDW: 18.4 % — ABNORMAL HIGH (ref 11.5–15.5)
WBC: 5.6 10*3/uL (ref 4.0–10.5)
nRBC: 0 % (ref 0.0–0.2)

## 2018-07-11 LAB — COMPREHENSIVE METABOLIC PANEL
ALT: 34 U/L (ref 0–44)
AST: 41 U/L (ref 15–41)
Albumin: 2.8 g/dL — ABNORMAL LOW (ref 3.5–5.0)
Alkaline Phosphatase: 91 U/L (ref 38–126)
Anion gap: 8 (ref 5–15)
BUN: 41 mg/dL — ABNORMAL HIGH (ref 8–23)
CO2: 19 mmol/L — ABNORMAL LOW (ref 22–32)
Calcium: 8.4 mg/dL — ABNORMAL LOW (ref 8.9–10.3)
Chloride: 113 mmol/L — ABNORMAL HIGH (ref 98–111)
Creatinine, Ser: 1.99 mg/dL — ABNORMAL HIGH (ref 0.44–1.00)
GFR calc Af Amer: 24 mL/min — ABNORMAL LOW (ref >60)
GFR calc non Af Amer: 21 mL/min — ABNORMAL LOW (ref >60)
Glucose, Bld: 168 mg/dL — ABNORMAL HIGH (ref 70–99)
Potassium: 4.3 mmol/L (ref 3.5–5.1)
Sodium: 140 mmol/L (ref 135–145)
Total Bilirubin: 0.7 mg/dL (ref 0.3–1.2)
Total Protein: 5.5 g/dL — ABNORMAL LOW (ref 6.5–8.1)

## 2018-07-11 MED ORDER — SODIUM CHLORIDE 0.9 % IV SOLN
Freq: Once | INTRAVENOUS | Status: AC
  Administered 2018-07-11: 11:00:00 1000 mL via INTRAVENOUS
  Filled 2018-07-11: qty 250

## 2018-07-11 MED ORDER — SODIUM CHLORIDE 0.9% FLUSH
10.0000 mL | INTRAVENOUS | Status: DC | PRN
  Administered 2018-07-11: 10 mL via INTRAVENOUS
  Filled 2018-07-11: qty 10

## 2018-07-11 MED ORDER — SODIUM CHLORIDE 0.9 % IV SOLN
Freq: Once | INTRAVENOUS | Status: AC
  Administered 2018-07-11: 12:00:00 via INTRAVENOUS
  Filled 2018-07-11: qty 250

## 2018-07-11 MED ORDER — PROCHLORPERAZINE MALEATE 10 MG PO TABS
10.0000 mg | ORAL_TABLET | Freq: Once | ORAL | Status: AC
  Administered 2018-07-11: 10 mg via ORAL
  Filled 2018-07-11: qty 1

## 2018-07-11 MED ORDER — HEPARIN SOD (PORK) LOCK FLUSH 100 UNIT/ML IV SOLN
500.0000 [IU] | Freq: Once | INTRAVENOUS | Status: AC
  Administered 2018-07-11: 13:00:00 500 [IU] via INTRAVENOUS
  Filled 2018-07-11: qty 5

## 2018-07-11 MED ORDER — SODIUM CHLORIDE 0.9 % IV SOLN
1300.0000 mg | Freq: Once | INTRAVENOUS | Status: AC
  Administered 2018-07-11: 1300 mg via INTRAVENOUS
  Filled 2018-07-11: qty 26.3

## 2018-07-11 MED ORDER — SODIUM CHLORIDE 0.9 % IV SOLN
Freq: Once | INTRAVENOUS | Status: DC
  Filled 2018-07-11: qty 250

## 2018-07-11 NOTE — Progress Notes (Signed)
Patient here today for follow up and treatment consideration regarding urothelial carcinoma. Patient reports hemorrhoids, has added in stool softener and OTC cream.

## 2018-07-11 NOTE — Progress Notes (Signed)
Creatinine: 1.99. MD, Dr. Grayland Ormond, notified and already aware. Per MD order: proceed with scheduled Gemzar treatment today.

## 2018-07-18 ENCOUNTER — Encounter: Payer: Medicare Other | Admitting: Hospice and Palliative Medicine

## 2018-07-18 ENCOUNTER — Telehealth: Payer: Self-pay | Admitting: *Deleted

## 2018-07-18 MED ORDER — HYDROCORTISONE ACETATE 25 MG RE SUPP: 25.0000 mg | Freq: Two times a day (BID) | RECTAL | 0 refills | Status: DC

## 2018-07-18 NOTE — Telephone Encounter (Signed)
Yes she has tried Anusol and tucks and most everything OTC

## 2018-07-18 NOTE — Telephone Encounter (Signed)
Has she tried abusol?  And I'm not sure what she has at home for nausea.

## 2018-07-18 NOTE — Telephone Encounter (Signed)
Hydrocortisone suppositories?  And compazine.

## 2018-07-18 NOTE — Telephone Encounter (Signed)
Prescription sent an daughter informed

## 2018-07-18 NOTE — Telephone Encounter (Signed)
Daughter called reporting that patient is having pain, when questioned, she reports that patient is having hemprrhoidal pain and difficulty walking from it. States that they have tried all the over the counter medications for it but not helping. Also reports that she is very weak and not bouncing back like she usually does and is having more nausea than usual. Please advise.

## 2018-07-24 ENCOUNTER — Other Ambulatory Visit: Payer: Self-pay

## 2018-07-24 NOTE — Progress Notes (Signed)
[place]  [phone]) [phone] Fax:(336) [phone]  ID: Kristi Thompson OB: Dec 09, 1923  MR#: [id number]  PJA#:[id number]  Patient Care Team: [name], MD as PCP - General (Internal Medicine) Clent Jacks, RN as Registered Nurse  CHIEF COMPLAINT: Urothelial carcinoma.  INTERVAL HISTORY: Patient returns to clinic today for further evaluation and consideration of cycle 5, day 15 of single agent gemcitabine.  Her performance status has declined over the past 2 weeks with increased weakness and fatigue and poor appetite.  She continues to have significant issues with hemorrhoids.  Her daughter reports that she has had increased incontinence as well as confusion and occasional slurred speech.  She also has worsening right leg swelling.  She has no other neurologic complaints.  She denies any fevers.  She has a poor appetite. She has no chest pain, shortness of breath, cough, or hemoptysis.  She denies any nausea, vomiting, or diarrhea.  She denies any melena or hematochezia.  Patient offers no further specific complaints today.  REVIEW OF SYSTEMS:   Review of Systems  Constitutional: Positive for malaise/fatigue. Negative for fever and weight loss.  Respiratory: Negative.  Negative for cough, hemoptysis and shortness of breath.   Cardiovascular: Positive for leg swelling. Negative for chest pain.  Gastrointestinal: Positive for constipation. Negative for abdominal pain, blood in stool, diarrhea, melena, nausea and vomiting.  Genitourinary: Positive for frequency. Negative for dysuria and hematuria.  Musculoskeletal: Negative.  Negative for back pain.  Skin: Negative.  Negative for rash.  Neurological: Positive for speech change and weakness. Negative for dizziness, focal weakness and headaches.  Psychiatric/Behavioral: Negative.  The patient is not nervous/anxious.     As per HPI. Otherwise, a complete review of systems is negative.  PAST MEDICAL  HISTORY: Past Medical History:  Diagnosis Date   Asthma    Cancer ([place]) 1978   colon ca   COPD (chronic obstructive pulmonary disease) (HCC)    GERD (gastroesophageal reflux disease)    History of hiatal hernia    Urothelial cancer ([place]) 2019   Urothelial carcinoma    PAST SURGICAL HISTORY: Past Surgical History:  Procedure Laterality Date   ABDOMINAL HYSTERECTOMY     APPENDECTOMY     bowel obstruction     CATARACT EXTRACTION W/ INTRAOCULAR LENS  IMPLANT, BILATERAL     CHOLECYSTECTOMY     COLON SURGERY     colon cancer   COLONOSCOPY WITH PROPOFOL N/A 01/18/2018   Procedure: COLONOSCOPY WITH PROPOFOL;  Surgeon: Jonathon Bellows, MD;  Location: [facility] ENDOSCOPY;  Service: Gastroenterology;  Laterality: N/A;   CYSTOSCOPY W/ URETERAL STENT PLACEMENT Right 02/06/2018   Procedure: CYSTOSCOPY WITH RETROGRADE PYELOGRAM;  Surgeon: Hollice Espy, MD;  Location: [facility] ORS;  Service: Urology;  Laterality: Right;   CYSTOSCOPY WITH URETEROSCOPY Right 02/06/2018   Procedure: CYSTOSCOPY WITH URETEROSCOPY;  Surgeon: Hollice Espy, MD;  Location: [facility] ORS;  Service: Urology;  Laterality: Right;   PORTA CATH INSERTION N/A 02/11/2018   Procedure: PORTA CATH INSERTION;  Surgeon: Algernon Huxley, MD;  Location: [place] [facility] LAB;  Service: Cardiovascular;  Laterality: N/A;   THYROID SURGERY     nodule removed   THYROIDECTOMY     TONSILLECTOMY     URETERAL BIOPSY Right 02/06/2018   Procedure: URETERAL BIOPSY;  Surgeon: Hollice Espy, MD;  Location: [facility] ORS;  Service: Urology;  Laterality: Right;    FAMILY HISTORY: Negative and noncontributory. History reviewed. No pertinent family history.  ADVANCED DIRECTIVES (Y/N):  N  HEALTH MAINTENANCE:  Social History   Tobacco Use   Smoking status: Never Smoker   Smokeless tobacco: Never Used  Substance Use Topics   Alcohol use: Yes    Comment: rarely   Drug use: No     Colonoscopy:  PAP:  Bone density:  Lipid  panel:  Allergies  Allergen Reactions   Demerol [Meperidine] Swelling   Sulfa Antibiotics Swelling    Current Outpatient Medications  Medication Sig Dispense Refill   acetaminophen (TYLENOL) 325 MG tablet Take 325 mg by mouth every 6 (six) hours as needed.     albuterol (PROVENTIL HFA;VENTOLIN HFA) 108 (90 Base) MCG/ACT inhaler Inhale into the lungs.     aspirin EC 81 MG tablet Take by mouth.     budesonide (PULMICORT FLEXHALER) 180 MCG/ACT inhaler Inhale into the lungs.     Cyanocobalamin 2500 MCG SUBL Place under the tongue.     famotidine (PEPCID) 40 MG tablet Take by mouth.     fluticasone (FLONASE) 50 MCG/ACT nasal spray USE 2 SPRAYS INTO BOTH     NOSTRILS NIGHTLY     HYDROcodone-acetaminophen (NORCO/VICODIN) 5-325 MG tablet TK 1 T PO Q 6 H PRN  0   hydrocortisone (ANUSOL-HC) 25 MG suppository Place 1 suppository (25 mg total) rectally 2 (two) times daily. 12 suppository 0   mirabegron ER (MYRBETRIQ) 25 MG TB24 tablet Take 1 tablet (25 mg total) by mouth daily. 30 tablet 4   montelukast (SINGULAIR) 10 MG tablet TAKE 1 TABLET DAILY     Multiple Vitamin (MULTI-VITAMINS) TABS Take by mouth.     Multiple Vitamins-Minerals (MULTIVITAMIN ADULT PO) Take by mouth.     ondansetron (ZOFRAN) 8 MG tablet Take 1 tablet (8 mg total) by mouth 2 (two) times daily as needed (Nausea or vomiting). 30 tablet 2   predniSONE (STERAPRED UNI-PAK 21 TAB) 10 MG (21) TBPK tablet Take as directed. 21 tablet 0   prochlorperazine (COMPAZINE) 10 MG tablet Take 1 tablet (10 mg total) by mouth every 6 (six) hours as needed (Nausea or vomiting). 60 tablet 2   ranitidine (ZANTAC) 300 MG tablet Take 1 tablet by mouth 1 day or 1 dose.     tamsulosin (FLOMAX) 0.4 MG CAPS capsule Take by mouth.     sulfamethoxazole-trimethoprim (BACTRIM DS) 800-160 MG tablet Take 1 tablet by mouth 2 (two) times daily. 14 tablet 0   No current facility-administered medications for this visit.      OBJECTIVE: Vitals:   07/25/18 0938  BP: (!) 175/80  Pulse: 90  Temp: (!) 97.5 F (36.4 C)  SpO2: 97%     Body mass index is 26.07 kg/m.    ECOG FS:0 - Asymptomatic  General: Ill-appearing, sitting in a wheelchair. Eyes: Pink conjunctiva, anicteric sclera. HEENT: Normocephalic, moist mucous membranes. Lungs: Clear to auscultation bilaterally. Heart: Regular rate and rhythm. No rubs, murmurs, or gallops. Abdomen: Soft, nontender, nondistended. No organomegaly noted, normoactive bowel sounds. Musculoskeletal: 2-3+ right leg edema. Neuro: Alert, answering all questions appropriately. Cranial nerves grossly intact. Skin: No rashes or petechiae noted. Psych: Normal affect.  LAB RESULTS:  Lab Results  Component Value Date   NA 137 07/25/2018   K 3.6 07/25/2018   CL 110 07/25/2018   CO2 19 (L) 07/25/2018   GLUCOSE 172 (H) 07/25/2018   BUN 29 (H) 07/25/2018   CREATININE 1.74 (H) 07/25/2018   CALCIUM 8.3 (L) 07/25/2018   PROT 5.6 (L) 07/25/2018   ALBUMIN 2.9 (L) 07/25/2018   AST 50 (H) 07/25/2018   ALT  30 07/25/2018   ALKPHOS 119 07/25/2018   BILITOT 2.0 (H) 07/25/2018   GFRNONAA 25 (L) 07/25/2018   GFRAA 29 (L) 07/25/2018    Lab Results  Component Value Date   WBC 8.8 07/25/2018   NEUTROABS 7.2 07/25/2018   HGB 8.4 (L) 07/25/2018   HCT 24.4 (L) 07/25/2018   MCV 103.8 (H) 07/25/2018   PLT 109 (L) 07/25/2018     STUDIES: Nm Pet Image Restag (ps) Skull Base To Thigh  Result Date: 07/09/2018 CLINICAL DATA:  Subsequent treatment strategy for urothelial carcinoma of the right ureter. Also with history of colon cancer. EXAM: NUCLEAR MEDICINE PET SKULL BASE TO THIGH TECHNIQUE: 7.1 mCi F-18 FDG was injected intravenously. Full-ring PET imaging was performed from the skull base to thigh after the radiotracer. CT data was obtained and used for attenuation correction and anatomic localization. Fasting blood glucose: 78 mg/dl COMPARISON:  01/04/2018. FINDINGS: Mediastinal  blood pool activity: SUV max 2.0 NECK: No hypermetabolic lymphadenopathy. Relatively diffuse FDG accumulation in the thyroid gland is similar to prior. Incidental CT findings: none CHEST: 11 mm right paratracheal lymph has increased from 9 mm short axis previously. Low level FDG accumulation in this node is minimally above blood pool at 2.8. Small prevascular nodes are stable. Low level uptake in each hilum is unchanged. Incidental CT findings: Right Port-A-Cath tip is positioned at the SVC/RA junction. Coronary artery calcification is evident. Atherosclerotic calcification is noted in the wall of the thoracic aorta. No suspicious pulmonary nodule or mass. Mosaic attenuation in the lungs likely reflects air trapping. Small bilateral pleural effusions are progressive in the interval. ABDOMEN/PELVIS: Stable abnormal appearance of the mid transverse colon, markedly hypermetabolic with SUV max = 73.4 today compared to 24.8 previously. Right hydroureteronephrosis persists. The abnormal hypermetabolism seen in the region of the mid right ureter is similar with SUV max = 9 today compared to 9.5 previously. The soft tissue lesion along the distal right ureter/right vaginal cuff is again noted to contain central calcification. Lesion measures 3.8 x 3.1 cm today compared to 3.8 x 3.6 cm previously. SUV max = 14.3 today compared to 11.7 previously. The right retrocaval lymph node measured previously at 2.1 cm is now 0.7 cm with no measurable hypermetabolism on today's study above background blood pool levels. Aortocaval lymph node measured previously at 2 cm short axis is now 0.8 cm without hypermetabolism. Right common iliac lymph node measured previously at 1.4 cm is now 0.8 cm. Hypermetabolism from the adjacent ureteral mass obscures is lymph node on PET imaging. The hypermetabolic nodule seen previously in the right paracolic gutter have resolved in the interval. Nodular areas of uptake are identified and central small  bowel loops on today's study, nonspecific. Incidental CT findings: Interval progression of diffuse body wall edema. Asymmetric subcutaneous edema is identified in the right thigh. Aortic Atherosclerois (ICD10-170.0) SKELETON: No focal hypermetabolic activity to suggest skeletal metastasis. Incidental CT findings: none IMPRESSION: 1. Similar right-sided hydroureteronephrosis with no substantial change in the hypermetabolic lesion of the mid right ureter. 2. Similar appearance of the hypermetabolic transverse colon mass. 3. Retroperitoneal lymphadenopathy in the right abdomen with the right pelvic sidewall lymphadenopathy described on the previous study has resolved based on size with no residual hypermetabolism evident today, consistent with response to therapy. 4. Calcified right pelvic mass along the distal right ureter and right vaginal cuff is stable in size and minimally progressed in hypermetabolism. 5. Interval resolution of the hypermetabolic nodules in the right paracolic gutter. 6. Interval progression  of small bilateral pleural effusions. 7. Interval progression of body wall edema including new asymmetric edema in the right thigh. No evidence for DVT on lower extremity ultrasound of 04/11/2018, but given persistent right pelvic sidewall disease today repeat assessment may be warranted. 8. Similar appearance of diffuse uptake in the thyroid parenchyma. 9.  Aortic Atherosclerois (ICD10-170.0) Electronically Signed   By: Misty Stanley M.D.   On: 07/09/2018 12:20    ASSESSMENT: Urothelial carcinoma, PDL-1 0%.  PLAN:    1.  Urothelial carcinoma: Case discussed with urology.  Cystoscopy on February 06, 2018 revealed a right ureteral mass.  Cytology report with high-grade urothelial carcinoma consistent with previous biopsy of abdominal mass.  Unfortunately, patient's PDL-1 is 0% therefore Tecentriq may not offer much benefit and was been discontinued. Initial plan was to give gemcitabine on days 1, 8, and  15 with a 22 off, but given problems with thrombocytopenia and neutropenia she can only tolerate treatment on days 1 and 15.  Given her advanced age and decreased kidney function, hesitant to add cisplatin.  PET scan results from July 09, 2018 reviewed independently with significant improvement of disease burden with near resolution of PET positive lymphadenopathy, but patient likely continues to have residual disease.  Will delay cycle 5, day 15 of gemcitabine secondary to declining performance status.  Return to clinic in 1 week for further evaluation and reconsideration of treatment.  2.  Hydronephrosis: Stent placement was not possible.  Nephrostomy tube was briefly discussed, but patient declined this at this time. 3.  Renal insufficiency: Patient's creatinine is 1.74 today.  Encourage increased fluid intake.  Monitor. Likely secondary to obstruction from tumor.  No nephrostomy tubes at this time.  No cisplatin as above.  Continue 1 L IV fluids with treatments.   4.  Anemia: Patient's hemoglobin continues to slowly trend down is now 8.4.  Monitor.  She last received IV Feraheme on May 16, 2018. 5.  Pain: Resolved. 6.  Lower extremity edema: Edema significantly worse today.  Repeat right lower extremity ultrasound. 7.  Incontinence/confusion: Patient possibly has a UTI therefore will empirically treat with Bactrim. 8.  Confusion/slurred speech: Will get MRI of the brain.   Patient expressed understanding and was in agreement with this plan. She also understands that She can call clinic at any time with any questions, concerns, or complaints.   Cancer Staging No matching staging information was found for the patient.  Kristi Huger, MD   07/25/2018 11:24 AM

## 2018-07-25 ENCOUNTER — Other Ambulatory Visit: Payer: Self-pay

## 2018-07-25 ENCOUNTER — Ambulatory Visit
Admission: RE | Admit: 2018-07-25 | Discharge: 2018-07-25 | Disposition: A | Discharge status: Home / Self Care | Payer: Medicare Other | Source: Ambulatory Visit | Attending: Oncology | Admitting: Oncology

## 2018-07-25 ENCOUNTER — Encounter: Payer: Self-pay | Admitting: Oncology

## 2018-07-25 ENCOUNTER — Telehealth: Payer: Self-pay | Admitting: *Deleted

## 2018-07-25 ENCOUNTER — Inpatient Hospital Stay (HOSPITAL_BASED_OUTPATIENT_CLINIC_OR_DEPARTMENT_OTHER): Payer: Medicare Other | Admitting: Oncology

## 2018-07-25 ENCOUNTER — Inpatient Hospital Stay: Payer: Medicare Other

## 2018-07-25 ENCOUNTER — Inpatient Hospital Stay: Payer: Medicare Other | Attending: Oncology

## 2018-07-25 VITALS — BP 175/80 | HR 90 | Temp 97.5°F | Ht 61.0 in | Wt 138.0 lb

## 2018-07-25 DIAGNOSIS — R0989 Other specified symptoms and signs involving the circulatory and respiratory systems: Secondary | ICD-10-CM | POA: Diagnosis present

## 2018-07-25 DIAGNOSIS — Z79899 Other long term (current) drug therapy: Secondary | ICD-10-CM

## 2018-07-25 DIAGNOSIS — R5383 Other fatigue: Secondary | ICD-10-CM | POA: Insufficient documentation

## 2018-07-25 DIAGNOSIS — Z7951 Long term (current) use of inhaled steroids: Secondary | ICD-10-CM | POA: Insufficient documentation

## 2018-07-25 DIAGNOSIS — C689 Malignant neoplasm of urinary organ, unspecified: Secondary | ICD-10-CM

## 2018-07-25 DIAGNOSIS — R531 Weakness: Secondary | ICD-10-CM | POA: Insufficient documentation

## 2018-07-25 DIAGNOSIS — J449 Chronic obstructive pulmonary disease, unspecified: Secondary | ICD-10-CM | POA: Diagnosis not present

## 2018-07-25 DIAGNOSIS — N1330 Unspecified hydronephrosis: Secondary | ICD-10-CM | POA: Insufficient documentation

## 2018-07-25 DIAGNOSIS — D649 Anemia, unspecified: Secondary | ICD-10-CM | POA: Insufficient documentation

## 2018-07-25 DIAGNOSIS — K219 Gastro-esophageal reflux disease without esophagitis: Secondary | ICD-10-CM

## 2018-07-25 DIAGNOSIS — R4781 Slurred speech: Secondary | ICD-10-CM

## 2018-07-25 DIAGNOSIS — J90 Pleural effusion, not elsewhere classified: Secondary | ICD-10-CM | POA: Insufficient documentation

## 2018-07-25 DIAGNOSIS — C661 Malignant neoplasm of right ureter: Secondary | ICD-10-CM | POA: Insufficient documentation

## 2018-07-25 DIAGNOSIS — R410 Disorientation, unspecified: Secondary | ICD-10-CM

## 2018-07-25 DIAGNOSIS — Z8551 Personal history of malignant neoplasm of bladder: Secondary | ICD-10-CM | POA: Diagnosis not present

## 2018-07-25 DIAGNOSIS — M7989 Other specified soft tissue disorders: Secondary | ICD-10-CM | POA: Diagnosis not present

## 2018-07-25 DIAGNOSIS — K449 Diaphragmatic hernia without obstruction or gangrene: Secondary | ICD-10-CM

## 2018-07-25 DIAGNOSIS — I82401 Acute embolism and thrombosis of unspecified deep veins of right lower extremity: Secondary | ICD-10-CM

## 2018-07-25 LAB — COMPREHENSIVE METABOLIC PANEL
ALT: 30 U/L (ref 0–44)
AST: 50 U/L — ABNORMAL HIGH (ref 15–41)
Albumin: 2.9 g/dL — ABNORMAL LOW (ref 3.5–5.0)
Alkaline Phosphatase: 119 U/L (ref 38–126)
Anion gap: 8 (ref 5–15)
BUN: 29 mg/dL — ABNORMAL HIGH (ref 8–23)
CO2: 19 mmol/L — ABNORMAL LOW (ref 22–32)
Calcium: 8.3 mg/dL — ABNORMAL LOW (ref 8.9–10.3)
Chloride: 110 mmol/L (ref 98–111)
Creatinine, Ser: 1.74 mg/dL — ABNORMAL HIGH (ref 0.44–1.00)
GFR calc Af Amer: 29 mL/min — ABNORMAL LOW (ref >60)
GFR calc non Af Amer: 25 mL/min — ABNORMAL LOW (ref >60)
Glucose, Bld: 172 mg/dL — ABNORMAL HIGH (ref 70–99)
Potassium: 3.6 mmol/L (ref 3.5–5.1)
Sodium: 137 mmol/L (ref 135–145)
Total Bilirubin: 2 mg/dL — ABNORMAL HIGH (ref 0.3–1.2)
Total Protein: 5.6 g/dL — ABNORMAL LOW (ref 6.5–8.1)

## 2018-07-25 LAB — CBC WITH DIFFERENTIAL/PLATELET
Abs Immature Granulocytes: 0.07 10*3/uL (ref 0.00–0.07)
Basophils Absolute: 0 10*3/uL (ref 0.0–0.1)
Basophils Relative: 0 %
Eosinophils Absolute: 0 10*3/uL (ref 0.0–0.5)
Eosinophils Relative: 0 %
HCT: 24.4 % — ABNORMAL LOW (ref 36.0–46.0)
Hemoglobin: 8.4 g/dL — ABNORMAL LOW (ref 12.0–15.0)
Immature Granulocytes: 1 %
Lymphocytes Relative: 7 %
Lymphs Abs: 0.6 10*3/uL — ABNORMAL LOW (ref 0.7–4.0)
MCH: 35.7 pg — ABNORMAL HIGH (ref 26.0–34.0)
MCHC: 34.4 g/dL (ref 30.0–36.0)
MCV: 103.8 fL — ABNORMAL HIGH (ref 80.0–100.0)
Monocytes Absolute: 0.9 10*3/uL (ref 0.1–1.0)
Monocytes Relative: 10 %
Neutro Abs: 7.2 10*3/uL (ref 1.7–7.7)
Neutrophils Relative %: 82 %
Platelets: 109 10*3/uL — ABNORMAL LOW (ref 150–400)
RBC: 2.35 MIL/uL — ABNORMAL LOW (ref 3.87–5.11)
RDW: 17.9 % — ABNORMAL HIGH (ref 11.5–15.5)
WBC: 8.8 10*3/uL (ref 4.0–10.5)
nRBC: 0.8 % — ABNORMAL HIGH (ref 0.0–0.2)

## 2018-07-25 MED ORDER — SODIUM CHLORIDE 0.9% FLUSH
10.0000 mL | Freq: Once | INTRAVENOUS | Status: AC
  Administered 2018-07-25: 10:00:00 10 mL via INTRAVENOUS
  Filled 2018-07-25: qty 10

## 2018-07-25 MED ORDER — SULFAMETHOXAZOLE-TRIMETHOPRIM 800-160 MG PO TABS: 1.0000 | ORAL_TABLET | Freq: Two times a day (BID) | ORAL | 0 refills | Status: DC

## 2018-07-25 MED ORDER — HEPARIN SOD (PORK) LOCK FLUSH 100 UNIT/ML IV SOLN
500.0000 [IU] | Freq: Once | INTRAVENOUS | Status: AC
  Administered 2018-07-25: 11:00:00 500 [IU] via INTRAVENOUS
  Filled 2018-07-25: qty 5

## 2018-07-25 MED ORDER — ELIQUIS 5 MG VTE STARTER PACK: ORAL_TABLET | ORAL | 0 refills | Status: DC

## 2018-07-25 NOTE — Progress Notes (Signed)
Per Herb Grays CMA per Dr. Grayland Ormond no treatment at this time. Pt stable at discharge.

## 2018-07-25 NOTE — Telephone Encounter (Signed)
Called report, Patient is still in [facility] waiting to you to call them  IMPRESSION: Examination is positive for occlusive DVT involving the right deep femoral vein. Nonocclusive thrombus is also seen within the right common femoral vein as well as the proximal and mid aspects of the right superficial femoral vein.   Electronically Signed   By: Sandi Mariscal M.D.   On: 07/25/2018 16:04

## 2018-07-25 NOTE — Telephone Encounter (Signed)
Pt state that was from 50 yrs ago and her and her daughter think bactrim will be ok.

## 2018-07-25 NOTE — Telephone Encounter (Signed)
Kristi Thompson from [facility] informed of physician response

## 2018-07-25 NOTE — Telephone Encounter (Signed)
Bactrim has been ordered for this patient who is allergic to Sulfa.Please advise

## 2018-07-25 NOTE — Progress Notes (Signed)
Patient stated that she had not been doing well. Patient stated that for the last two weeks she has had trouble walking, slurred speech and not urinating as much. Patient stated that her appetite is still the same but fluid intake is not so good. Noticed that patient had edema on her right leg and painful at times. Patient stated that it has been that way for two weeks. Patient gained 8 lbs since 06/27/2018, possibly water retention. Patient denied fever, nausea, vomiting, diarrhea or constipation.

## 2018-07-29 ENCOUNTER — Telehealth: Payer: Self-pay | Admitting: *Deleted

## 2018-07-29 DIAGNOSIS — R4781 Slurred speech: Secondary | ICD-10-CM

## 2018-07-29 NOTE — Telephone Encounter (Signed)
Called Kristi Thompson from Radiology to let her know that patient's MRI was changed to non-contrast per their request and Dr. Grayland Ormond agreed. Called patient's daughter Kristi Thompson to let her know as well. Mekiah wanted to let Dr. Grayland Ormond know that her mother had been having pain on her right leg and wanted to know if it was okay to take Hydrocodone-Acetaminophen that she has at home or if she should just take Acetaminophen. I told her that I would ask Dr. Grayland Ormond and that I would call her back once I have a response from him. She agreed.

## 2018-07-29 NOTE — Telephone Encounter (Signed)
Patient is scheduled for MRI Brain with contrast, her creat is 1.74 and she cannot get contrast. Please change order to MRI without contrast or cancel MRI. If you need to discuss this she can be recahed at [phone] before 2 or if after 2 call [phone] and speak with one of the techs

## 2018-07-29 NOTE — Progress Notes (Signed)
Patient not evaluated by palliative care. Rescheduled for next week per patient request.   Appointment changed.  Faythe Casa, NP 07/29/2018 12:28 PM

## 2018-07-29 NOTE — Telephone Encounter (Signed)
Called Clela-patient's daughter to let her know that per Dr. Grayland Ormond, is was okay for her to take Hydrocodone-Acetaminophen but not to give her additional Acetaminophen. She understood and had no further questions.

## 2018-07-30 ENCOUNTER — Other Ambulatory Visit: Payer: Self-pay

## 2018-07-30 ENCOUNTER — Ambulatory Visit
Admission: RE | Admit: 2018-07-30 | Discharge: 2018-07-30 | Disposition: A | Discharge status: Home / Self Care | Payer: Medicare Other | Source: Ambulatory Visit | Attending: Oncology | Admitting: Oncology

## 2018-07-30 ENCOUNTER — Ambulatory Visit: Payer: Medicare Other

## 2018-07-30 DIAGNOSIS — R4781 Slurred speech: Secondary | ICD-10-CM | POA: Diagnosis present

## 2018-07-31 ENCOUNTER — Telehealth: Payer: Self-pay | Admitting: *Deleted

## 2018-07-31 ENCOUNTER — Other Ambulatory Visit: Payer: Self-pay | Admitting: *Deleted

## 2018-07-31 DIAGNOSIS — R399 Unspecified symptoms and signs involving the genitourinary system: Secondary | ICD-10-CM

## 2018-07-31 NOTE — Telephone Encounter (Signed)
Patients daughter called to report concerns about patient. Per daughter patient started experiencing nausea, vomiting, chills, worsening abdominal and back pain, confusion and weakness yesterday. Patients daughter denies fever at this time. Patient was started on Bactrim on 5/14 for possible UTI, per daughter patient has allergy to sulfa drugs from many years ago. Patient is scheduled for lab/MD/Treatment tomorrow in clinic. I spoke with Dr. Grayland Ormond, he recommends stopping Bactrim at this time and keep clinic visit as scheduled for tomorrow. UA and urine culture ordered as well for tomorrow. Patients daughter made aware of recommendations and verbalized understanding of plan. I also advised her that if patient seemed worse to call us back this afternoon during clinic hours or proceed to ER if after hours.

## 2018-08-01 ENCOUNTER — Other Ambulatory Visit: Payer: Self-pay

## 2018-08-01 ENCOUNTER — Encounter: Payer: Self-pay | Admitting: Oncology

## 2018-08-01 ENCOUNTER — Inpatient Hospital Stay: Payer: Medicare Other

## 2018-08-01 ENCOUNTER — Inpatient Hospital Stay (HOSPITAL_BASED_OUTPATIENT_CLINIC_OR_DEPARTMENT_OTHER): Payer: Medicare Other | Admitting: Oncology

## 2018-08-01 ENCOUNTER — Inpatient Hospital Stay: Payer: Medicare Other | Admitting: *Deleted

## 2018-08-01 ENCOUNTER — Inpatient Hospital Stay (HOSPITAL_BASED_OUTPATIENT_CLINIC_OR_DEPARTMENT_OTHER): Payer: Medicare Other | Admitting: Hospice and Palliative Medicine

## 2018-08-01 VITALS — BP 146/74 | HR 89 | Temp 78.0°F | Ht 61.0 in | Wt 138.0 lb

## 2018-08-01 VITALS — Temp 95.0°F

## 2018-08-01 DIAGNOSIS — R410 Disorientation, unspecified: Secondary | ICD-10-CM

## 2018-08-01 DIAGNOSIS — C689 Malignant neoplasm of urinary organ, unspecified: Secondary | ICD-10-CM | POA: Diagnosis not present

## 2018-08-01 DIAGNOSIS — K449 Diaphragmatic hernia without obstruction or gangrene: Secondary | ICD-10-CM

## 2018-08-01 DIAGNOSIS — Z8551 Personal history of malignant neoplasm of bladder: Secondary | ICD-10-CM

## 2018-08-01 DIAGNOSIS — R399 Unspecified symptoms and signs involving the genitourinary system: Secondary | ICD-10-CM

## 2018-08-01 DIAGNOSIS — J90 Pleural effusion, not elsewhere classified: Secondary | ICD-10-CM

## 2018-08-01 DIAGNOSIS — D649 Anemia, unspecified: Secondary | ICD-10-CM | POA: Diagnosis not present

## 2018-08-01 DIAGNOSIS — Z95828 Presence of other vascular implants and grafts: Secondary | ICD-10-CM

## 2018-08-01 DIAGNOSIS — R5383 Other fatigue: Secondary | ICD-10-CM

## 2018-08-01 DIAGNOSIS — Z515 Encounter for palliative care: Secondary | ICD-10-CM

## 2018-08-01 DIAGNOSIS — J449 Chronic obstructive pulmonary disease, unspecified: Secondary | ICD-10-CM

## 2018-08-01 DIAGNOSIS — Z79899 Other long term (current) drug therapy: Secondary | ICD-10-CM

## 2018-08-01 DIAGNOSIS — R4781 Slurred speech: Secondary | ICD-10-CM

## 2018-08-01 DIAGNOSIS — M7989 Other specified soft tissue disorders: Secondary | ICD-10-CM

## 2018-08-01 DIAGNOSIS — N1330 Unspecified hydronephrosis: Secondary | ICD-10-CM

## 2018-08-01 DIAGNOSIS — R531 Weakness: Secondary | ICD-10-CM

## 2018-08-01 DIAGNOSIS — C661 Malignant neoplasm of right ureter: Secondary | ICD-10-CM

## 2018-08-01 DIAGNOSIS — K219 Gastro-esophageal reflux disease without esophagitis: Secondary | ICD-10-CM

## 2018-08-01 DIAGNOSIS — Z7951 Long term (current) use of inhaled steroids: Secondary | ICD-10-CM

## 2018-08-01 LAB — CBC WITH DIFFERENTIAL/PLATELET
Abs Immature Granulocytes: 0.03 10*3/uL (ref 0.00–0.07)
Basophils Absolute: 0.1 10*3/uL (ref 0.0–0.1)
Basophils Relative: 1 %
Eosinophils Absolute: 0.2 10*3/uL (ref 0.0–0.5)
Eosinophils Relative: 3 %
HCT: 22.3 % — ABNORMAL LOW (ref 36.0–46.0)
Hemoglobin: 7.4 g/dL — ABNORMAL LOW (ref 12.0–15.0)
Immature Granulocytes: 0 %
Lymphocytes Relative: 11 %
Lymphs Abs: 0.8 10*3/uL (ref 0.7–4.0)
MCH: 36.3 pg — ABNORMAL HIGH (ref 26.0–34.0)
MCHC: 33.2 g/dL (ref 30.0–36.0)
MCV: 109.3 fL — ABNORMAL HIGH (ref 80.0–100.0)
Monocytes Absolute: 0.7 10*3/uL (ref 0.1–1.0)
Monocytes Relative: 10 %
Neutro Abs: 5 10*3/uL (ref 1.7–7.7)
Neutrophils Relative %: 75 %
Platelets: 186 10*3/uL (ref 150–400)
RBC: 2.04 MIL/uL — ABNORMAL LOW (ref 3.87–5.11)
RDW: 18.2 % — ABNORMAL HIGH (ref 11.5–15.5)
WBC: 6.7 10*3/uL (ref 4.0–10.5)
nRBC: 0 % (ref 0.0–0.2)

## 2018-08-01 LAB — COMPREHENSIVE METABOLIC PANEL
ALT: 13 U/L (ref 0–44)
AST: 30 U/L (ref 15–41)
Albumin: 2.8 g/dL — ABNORMAL LOW (ref 3.5–5.0)
Alkaline Phosphatase: 105 U/L (ref 38–126)
Anion gap: 8 (ref 5–15)
BUN: 36 mg/dL — ABNORMAL HIGH (ref 8–23)
CO2: 18 mmol/L — ABNORMAL LOW (ref 22–32)
Calcium: 8.1 mg/dL — ABNORMAL LOW (ref 8.9–10.3)
Chloride: 109 mmol/L (ref 98–111)
Creatinine, Ser: 2 mg/dL — ABNORMAL HIGH (ref 0.44–1.00)
GFR calc Af Amer: 24 mL/min — ABNORMAL LOW (ref >60)
GFR calc non Af Amer: 21 mL/min — ABNORMAL LOW (ref >60)
Glucose, Bld: 163 mg/dL — ABNORMAL HIGH (ref 70–99)
Potassium: 4 mmol/L (ref 3.5–5.1)
Sodium: 135 mmol/L (ref 135–145)
Total Bilirubin: 0.8 mg/dL (ref 0.3–1.2)
Total Protein: 5.7 g/dL — ABNORMAL LOW (ref 6.5–8.1)

## 2018-08-01 LAB — URINALYSIS, COMPLETE (UACMP) WITH MICROSCOPIC
Bacteria, UA: NONE SEEN
Bilirubin Urine: NEGATIVE
Glucose, UA: NEGATIVE mg/dL
Ketones, ur: NEGATIVE mg/dL
Nitrite: NEGATIVE
Protein, ur: 100 mg/dL — AB
Specific Gravity, Urine: 1.016 (ref 1.005–1.030)
Squamous Epithelial / HPF: NONE SEEN (ref 0–5)
WBC, UA: >50 WBC/hpf — ABNORMAL HIGH (ref 0–5)
pH: 5 (ref 5.0–8.0)

## 2018-08-01 MED ORDER — HEPARIN SOD (PORK) LOCK FLUSH 100 UNIT/ML IV SOLN
500.0000 [IU] | Freq: Once | INTRAVENOUS | Status: AC | PRN
  Administered 2018-08-01: 500 [IU]
  Filled 2018-08-01: qty 5

## 2018-08-01 MED ORDER — APIXABAN 5 MG PO TABS: 5.0000 mg | ORAL_TABLET | Freq: Two times a day (BID) | ORAL | 5 refills | Status: DC

## 2018-08-01 MED ORDER — SODIUM CHLORIDE 0.9 % IV SOLN
Freq: Once | INTRAVENOUS | Status: AC
  Administered 2018-08-01: 11:00:00 via INTRAVENOUS
  Filled 2018-08-01: qty 250

## 2018-08-01 MED ORDER — SODIUM CHLORIDE 0.9% FLUSH
10.0000 mL | Freq: Once | INTRAVENOUS | Status: AC
  Administered 2018-08-01: 10 mL via INTRAVENOUS
  Filled 2018-08-01: qty 10

## 2018-08-01 NOTE — Progress Notes (Signed)
Patient is here today to follow up on her urothelial carcinoma. Patient stated that she continues to feel terrible. Patient stated that she stays weak, fatigued, nauseated and vomiting throughout the week. Patient continues to have slurred speech.

## 2018-08-01 NOTE — Progress Notes (Signed)
[facility]  [phone] [phone]  ID: Kristi Thompson OB: 1923-03-30  MR#: [id number]  PBD#:[id number]  Patient Care Team: [name], MD as PCP - General (Internal Medicine) Clent Jacks, RN as Registered Nurse  CHIEF COMPLAINT: Urothelial carcinoma.  INTERVAL HISTORY: Patient returns to clinic today for further evaluation and reconsideration of gemcitabine.  She continues to have a significantly decreased performance status with increased weakness and fatigue and a poor appetite.  She continues to have persistent nausea.  She has no neurologic complaints this week.  She denies any fevers. She has no chest pain, shortness of breath, cough, or hemoptysis.  She denies any constipation or diarrhea.  She denies any melena or hematochezia.  Patient feels generally terrible, but offers no further specific complaints.  REVIEW OF SYSTEMS:   Review of Systems  Constitutional: Positive for malaise/fatigue. Negative for fever and weight loss.  Respiratory: Negative.  Negative for cough, hemoptysis and shortness of breath.   Cardiovascular: Positive for leg swelling. Negative for chest pain.  Gastrointestinal: Negative for abdominal pain, blood in stool, constipation, diarrhea, melena, nausea and vomiting.  Genitourinary: Negative for dysuria, frequency and hematuria.  Musculoskeletal: Negative.  Negative for back pain.  Skin: Negative.  Negative for rash.  Neurological: Positive for weakness. Negative for dizziness, speech change, focal weakness and headaches.  Psychiatric/Behavioral: Negative.  The patient is not nervous/anxious.     As per HPI. Otherwise, a complete review of systems is negative.  PAST MEDICAL HISTORY: Past Medical History:  Diagnosis Date   Asthma    Cancer ([place]) 1978   colon ca   COPD (chronic obstructive pulmonary disease) (HCC)    GERD (gastroesophageal reflux disease)    History of hiatal hernia     Urothelial cancer ([place]) 2019   Urothelial carcinoma    PAST SURGICAL HISTORY: Past Surgical History:  Procedure Laterality Date   ABDOMINAL HYSTERECTOMY     APPENDECTOMY     bowel obstruction     CATARACT EXTRACTION W/ INTRAOCULAR LENS  IMPLANT, BILATERAL     CHOLECYSTECTOMY     COLON SURGERY     colon cancer   COLONOSCOPY WITH PROPOFOL N/A 01/18/2018   Procedure: COLONOSCOPY WITH PROPOFOL;  Surgeon: Jonathon Bellows, MD;  Location: [facility];  Service: Gastroenterology;  Laterality: N/A;   CYSTOSCOPY W/ URETERAL STENT PLACEMENT Right 02/06/2018   Procedure: CYSTOSCOPY WITH RETROGRADE PYELOGRAM;  Surgeon: Hollice Espy, MD;  Location: [facility] ORS;  Service: Urology;  Laterality: Right;   CYSTOSCOPY WITH URETEROSCOPY Right 02/06/2018   Procedure: CYSTOSCOPY WITH URETEROSCOPY;  Surgeon: Hollice Espy, MD;  Location: [facility];  Service: Urology;  Laterality: Right;   PORTA CATH INSERTION N/A 02/11/2018   Procedure: PORTA CATH INSERTION;  Surgeon: Algernon Huxley, MD;  Location: [facility];  Service: Cardiovascular;  Laterality: N/A;   THYROID SURGERY     nodule removed   THYROIDECTOMY     TONSILLECTOMY     URETERAL BIOPSY Right 02/06/2018   Procedure: URETERAL BIOPSY;  Surgeon: Hollice Espy, MD;  Location: [facility];  Service: Urology;  Laterality: Right;    FAMILY HISTORY: Negative and noncontributory. History reviewed. No pertinent family history.  ADVANCED DIRECTIVES (Y/N):  N  HEALTH MAINTENANCE: Social History   Tobacco Use   Smoking status: Never Smoker   Smokeless tobacco: Never Used  Substance Use Topics   Alcohol use: Yes    Comment: rarely   Drug use: No     Colonoscopy:  PAP:  Bone density:  Lipid panel:  Allergies  Allergen Reactions   Demerol [Meperidine] Swelling   Sulfa Antibiotics Swelling    Current Outpatient Medications  Medication Sig Dispense Refill   acetaminophen (TYLENOL) 325 MG tablet Take 325 mg by mouth  every 6 (six) hours as needed.     albuterol (PROVENTIL HFA;VENTOLIN HFA) 108 (90 Base) MCG/ACT inhaler Inhale into the lungs.     budesonide (PULMICORT FLEXHALER) 180 MCG/ACT inhaler Inhale into the lungs.     Cyanocobalamin 2500 MCG SUBL Place under the tongue.     Eliquis DVT/PE Starter Pack (ELIQUIS STARTER PACK) 5 MG TABS Take as directed on package: start with two-48m tablets twice daily for 7 days. On day 8, switch to one-566mtablet twice daily. 1 each 0   famotidine (PEPCID) 40 MG tablet Take by mouth.     fluticasone (FLONASE) 50 MCG/ACT nasal spray USE 2 SPRAYS INTO BOTH     NOSTRILS NIGHTLY     HYDROcodone-acetaminophen (NORCO/VICODIN) 5-325 MG tablet TK 1 T PO Q 6 H PRN  0   hydrocortisone (ANUSOL-HC) 25 MG suppository Place 1 suppository (25 mg total) rectally 2 (two) times daily. 12 suppository 0   mirabegron ER (MYRBETRIQ) 25 MG TB24 tablet Take 1 tablet (25 mg total) by mouth daily. 30 tablet 4   montelukast (SINGULAIR) 10 MG tablet TAKE 1 TABLET DAILY     Multiple Vitamin (MULTI-VITAMINS) TABS Take by mouth.     Multiple Vitamins-Minerals (MULTIVITAMIN ADULT PO) Take by mouth.     ondansetron (ZOFRAN) 8 MG tablet Take 1 tablet (8 mg total) by mouth 2 (two) times daily as needed (Nausea or vomiting). 30 tablet 2   predniSONE (STERAPRED UNI-PAK 21 TAB) 10 MG (21) TBPK tablet Take as directed. 21 tablet 0   prochlorperazine (COMPAZINE) 10 MG tablet Take 1 tablet (10 mg total) by mouth every 6 (six) hours as needed (Nausea or vomiting). 60 tablet 2   ranitidine (ZANTAC) 300 MG tablet Take 1 tablet by mouth 1 day or 1 dose.     sulfamethoxazole-trimethoprim (BACTRIM DS) 800-160 MG tablet Take 1 tablet by mouth 2 (two) times daily. 14 tablet 0   tamsulosin (FLOMAX) 0.4 MG CAPS capsule Take by mouth.     apixaban (ELIQUIS) 5 MG TABS tablet Take 1 tablet (5 mg total) by mouth 2 (two) times daily. Initiate once starter pack is completed. 60 tablet 5   No current  facility-administered medications for this visit.     OBJECTIVE: Vitals:   08/01/18 0956  BP: (!) 146/74  Pulse: 89  Temp: (!) 78 F (25.6 C)     Body mass index is 26.07 kg/m.    ECOG FS:0 - Asymptomatic  General: Ill-appearing, no acute distress. Eyes: Pink conjunctiva, anicteric sclera. HEENT: Normocephalic, moist mucous membranes, clear oropharnyx. Lungs: Clear to auscultation bilaterally. Heart: Regular rate and rhythm. No rubs, murmurs, or gallops. Abdomen: Soft, nontender, nondistended. No organomegaly noted, normoactive bowel sounds. Musculoskeletal: 2+ right leg edema. Neuro: Alert, answering all questions appropriately. Cranial nerves grossly intact. Skin: No rashes or petechiae noted. Psych: Normal affect.  LAB RESULTS:  Lab Results  Component Value Date   NA 135 08/01/2018   K 4.0 08/01/2018   CL 109 08/01/2018   CO2 18 (L) 08/01/2018   GLUCOSE 163 (H) 08/01/2018   BUN 36 (H) 08/01/2018   CREATININE 2.00 (H) 08/01/2018   CALCIUM 8.1 (L) 08/01/2018   PROT 5.7 (L) 08/01/2018   ALBUMIN 2.8 (L)  08/01/2018   AST 30 08/01/2018   ALT 13 08/01/2018   ALKPHOS 105 08/01/2018   BILITOT 0.8 08/01/2018   GFRNONAA 21 (L) 08/01/2018   GFRAA 24 (L) 08/01/2018    Lab Results  Component Value Date   WBC 6.7 08/01/2018   NEUTROABS 5.0 08/01/2018   HGB 7.4 (L) 08/01/2018   HCT 22.3 (L) 08/01/2018   MCV 109.3 (H) 08/01/2018   PLT 186 08/01/2018     STUDIES: Mr Brain Wo Contrast  Result Date: 07/30/2018 CLINICAL DATA:  Slurred speech, generalized weakness, and confusion. History of urothelial carcinoma. No IV contrast administered due to diminished renal function. EXAM: MRI HEAD WITHOUT CONTRAST TECHNIQUE: Multiplanar, multiecho pulse sequences of the brain and surrounding structures were obtained without intravenous contrast. COMPARISON:  Head CT 02/22/2015 FINDINGS: Brain: There is no evidence of acute infarct, intracranial hemorrhage, mass, midline shift, or  extra-axial fluid collection. Periventricular and deep cerebral white matter T2/FLAIR hyperintensities are nonspecific but compatible with minimal chronic small vessel ischemic disease. The ventricles and sulci are within normal limits for age. Vascular: Major intracranial vascular flow voids are preserved. Skull and upper cervical spine: No suspicious marrow lesion. Sinuses/Orbits: Bilateral cataract extraction. Mild right maxillary sinus mucosal thickening. Clear mastoid air cells. Other: 1.7 cm T2 hyperintense subcutaneous lesion in the left face, likely a sebaceous cyst. IMPRESSION: 1. No acute intracranial abnormality. 2. Minimal chronic small vessel ischemic disease. Electronically Signed   By: Logan Bores M.D.   On: 07/30/2018 18:03   Nm Pet Image Restag (ps) Skull Base To Thigh  Result Date: 07/09/2018 CLINICAL DATA:  Subsequent treatment strategy for urothelial carcinoma of the right ureter. Also with history of colon cancer. EXAM: NUCLEAR MEDICINE PET SKULL BASE TO THIGH TECHNIQUE: 7.1 mCi F-18 FDG was injected intravenously. Full-ring PET imaging was performed from the skull base to thigh after the radiotracer. CT data was obtained and used for attenuation correction and anatomic localization. Fasting blood glucose: 78 mg/dl COMPARISON:  01/04/2018. FINDINGS: Mediastinal blood pool activity: SUV max 2.0 NECK: No hypermetabolic lymphadenopathy. Relatively diffuse FDG accumulation in the thyroid gland is similar to prior. Incidental CT findings: none CHEST: 11 mm right paratracheal lymph has increased from 9 mm short axis previously. Low level FDG accumulation in this node is minimally above blood pool at 2.8. Small prevascular nodes are stable. Low level uptake in each hilum is unchanged. Incidental CT findings: Right Port-A-Cath tip is positioned at the SVC/RA junction. Coronary artery calcification is evident. Atherosclerotic calcification is noted in the wall of the thoracic aorta. No suspicious  pulmonary nodule or mass. Mosaic attenuation in the lungs likely reflects air trapping. Small bilateral pleural effusions are progressive in the interval. ABDOMEN/PELVIS: Stable abnormal appearance of the mid transverse colon, markedly hypermetabolic with SUV max = 49.2 today compared to 24.8 previously. Right hydroureteronephrosis persists. The abnormal hypermetabolism seen in the region of the mid right ureter is similar with SUV max = 9 today compared to 9.5 previously. The soft tissue lesion along the distal right ureter/right vaginal cuff is again noted to contain central calcification. Lesion measures 3.8 x 3.1 cm today compared to 3.8 x 3.6 cm previously. SUV max = 14.3 today compared to 11.7 previously. The right retrocaval lymph node measured previously at 2.1 cm is now 0.7 cm with no measurable hypermetabolism on today's study above background blood pool levels. Aortocaval lymph node measured previously at 2 cm short axis is now 0.8 cm without hypermetabolism. Right common iliac lymph node measured  previously at 1.4 cm is now 0.8 cm. Hypermetabolism from the adjacent ureteral mass obscures is lymph node on PET imaging. The hypermetabolic nodule seen previously in the right paracolic gutter have resolved in the interval. Nodular areas of uptake are identified and central small bowel loops on today's study, nonspecific. Incidental CT findings: Interval progression of diffuse body wall edema. Asymmetric subcutaneous edema is identified in the right thigh. Aortic Atherosclerois (ICD10-170.0) SKELETON: No focal hypermetabolic activity to suggest skeletal metastasis. Incidental CT findings: none IMPRESSION: 1. Similar right-sided hydroureteronephrosis with no substantial change in the hypermetabolic lesion of the mid right ureter. 2. Similar appearance of the hypermetabolic transverse colon mass. 3. Retroperitoneal lymphadenopathy in the right abdomen with the right pelvic sidewall lymphadenopathy described on  the previous study has resolved based on size with no residual hypermetabolism evident today, consistent with response to therapy. 4. Calcified right pelvic mass along the distal right ureter and right vaginal cuff is stable in size and minimally progressed in hypermetabolism. 5. Interval resolution of the hypermetabolic nodules in the right paracolic gutter. 6. Interval progression of small bilateral pleural effusions. 7. Interval progression of body wall edema including new asymmetric edema in the right thigh. No evidence for DVT on lower extremity ultrasound of 04/11/2018, but given persistent right pelvic sidewall disease today repeat assessment may be warranted. 8. Similar appearance of diffuse uptake in the thyroid parenchyma. 9.  Aortic Atherosclerois (ICD10-170.0) Electronically Signed   By: Misty Stanley M.D.   On: 07/09/2018 12:20   US Venous Img Lower Unilateral Right  Result Date: 07/25/2018 CLINICAL DATA:  Right lower extremity edema. History of bladder cancer. Evaluate for DVT. EXAM: RIGHT LOWER EXTREMITY VENOUS DOPPLER ULTRASOUND TECHNIQUE: Gray-scale sonography with graded compression, as well as color Doppler and duplex ultrasound were performed to evaluate the lower extremity deep venous systems from the level of the common femoral vein and including the common femoral, femoral, profunda femoral, popliteal and calf veins including the posterior tibial, peroneal and gastrocnemius veins when visible. The superficial great saphenous vein was also interrogated. Spectral Doppler was utilized to evaluate flow at rest and with distal augmentation maneuvers in the common femoral, femoral and popliteal veins. COMPARISON:  Right lower extremity venous Doppler ultrasound-04/11/2018; 07/24/2014 FINDINGS: Contralateral Common Femoral Vein: Respiratory phasicity is normal and symmetric with the symptomatic side. No evidence of thrombus. Normal compressibility. Common Femoral Vein: There is hypoechoic  nonocclusive DVT within the distal aspect of the right femoral vein (representative images 8 and 11). Saphenofemoral Junction: No evidence of thrombus. Normal compressibility and flow on color Doppler imaging. Profunda Femoral Vein: There is hypoechoic occlusive thrombus within the interrogated portions the right deep femoral vein (image 18). Femoral Vein: There is hypoechoic nonocclusive thrombus within the proximal (image 20) and mid (image 23) aspects of the right femoral vein. The distal aspect the right femoral vein appears patent. Popliteal Vein: No evidence of thrombus. Normal compressibility, respiratory phasicity and response to augmentation. Calf Veins: No evidence of thrombus. Normal compressibility and flow on color Doppler imaging. Superficial Great Saphenous Vein: No evidence of thrombus. Normal compressibility. Venous Reflux:  None. Other Findings: Moderate amount of subcutaneous edema is noted at the level of the right lower leg and calf. IMPRESSION: Examination is positive for occlusive DVT involving the right deep femoral vein. Nonocclusive thrombus is also seen within the right common femoral vein as well as the proximal and mid aspects of the right superficial femoral vein. Electronically Signed   By: Eldridge Abrahams.D.  On: 07/25/2018 16:04    ASSESSMENT: Urothelial carcinoma, PDL-1 0%.  PLAN:    1.  Urothelial carcinoma: Case discussed with urology.  Cystoscopy on February 06, 2018 revealed a right ureteral mass.  Cytology report with high-grade urothelial carcinoma consistent with previous biopsy of abdominal mass.  Unfortunately, patient's PDL-1 is 0% therefore Tecentriq may not offer much benefit and was been discontinued. Initial plan was to give gemcitabine on days 1, 8, and 15 with a 22 off, but given problems with thrombocytopenia and neutropenia she can only tolerate treatment on days 1 and 15.  Given her advanced age and decreased kidney function, hesitant to add cisplatin.  PET  scan results from July 09, 2018 reviewed independently with significant improvement of disease burden with near resolution of PET positive lymphadenopathy, but patient likely continues to have residual disease.  Delay gemcitabine once again secondary to decreased performance status.  Proceed with IV fluids today.  Return to clinic in 1 week for laboratory work and possible blood transfusion and then in 2 weeks for further evaluation and reconsideration of treatment.   2.  Hydronephrosis: Stent placement was not possible.  Nephrostomy tube was briefly discussed, but patient declined this at this time. 3.  Renal insufficiency: Patient's creatinine has trended up to 2.0 today.  IV fluids as above.  Likely secondary to obstruction from tumor.  No nephrostomy tubes at this time.  No cisplatin as above.  Continue 1 L IV fluids with treatments.   4.  Anemia: Patient's hemoglobin continues to slowly trend down and is now 7.4.  Consider blood transfusion in 1 week if continues to decline. 5.  Pain: Resolved. 6.  Right DVT: Continue Eliquis as prescribed. 7.  Incontinence/confusion: Improved. 8.  Confusion/slurred speech: MRI of the brain on Jul 30, 2018 reviewed independently with no obvious intracranial abnormality.   Patient expressed understanding and was in agreement with this plan. She also understands that She can call clinic at any time with any questions, concerns, or complaints.   Cancer Staging No matching staging information was found for the patient.  Lloyd Huger, MD   08/04/2018 7:35 AM

## 2018-08-01 NOTE — Progress Notes (Signed)
[place]  Telephone:([phone] [phone]   Name: Kristi Thompson Date: 08/01/2018 MRN: [id number]  DOB: 01/21/1924  Patient Care Team: [name], MD as PCP - General (Internal Medicine) Clent Jacks, RN as Registered Nurse    REASON FOR CONSULTATION: Palliative Care consult requested for this 85 y.o. female with multiple medical problems including asthma, COPD, and GERD, who was recently found to have a pelvic mass with right hydronephrosis and retroperitoneal lymphadenopathy.  Biopsy of the right periadrenal lymph node was consistent with carcinoma of unknown primary but favored urothelial origin.  Patient was treated on atezolizumab.  She was referred to palliative care to help address goals and support her through the treatment process.  SOCIAL HISTORY:    Patient is widowed.  She lives at [facility] in a senior apartment.  She has a daughter who lives in [place] and a son and daughter who lives in [place].  She is originally from [place].  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  ADVANCE DIRECTIVES:  Patient has healthcare power of attorney and living will on file.  Her living will specifies a desire for natural death.  CODE STATUS: DNR  PAST MEDICAL HISTORY: Past Medical History:  Diagnosis Date   Asthma    Cancer ([place]) 1978   colon ca   COPD (chronic obstructive pulmonary disease) ([place])    GERD (gastroesophageal reflux disease)    History of hiatal hernia    Urothelial cancer ([place]) 2019   Urothelial carcinoma    PAST SURGICAL HISTORY:  Past Surgical History:  Procedure Laterality Date   ABDOMINAL HYSTERECTOMY     APPENDECTOMY     bowel obstruction     CATARACT EXTRACTION W/ INTRAOCULAR LENS  IMPLANT, BILATERAL     CHOLECYSTECTOMY     COLON SURGERY     colon cancer   COLONOSCOPY WITH PROPOFOL N/A 01/18/2018   Procedure: COLONOSCOPY WITH PROPOFOL;  Surgeon: Jonathon Bellows, MD;  Location:  [facility] ENDOSCOPY;  Service: Gastroenterology;  Laterality: N/A;   CYSTOSCOPY W/ URETERAL STENT PLACEMENT Right 02/06/2018   Procedure: CYSTOSCOPY WITH RETROGRADE PYELOGRAM;  Surgeon: Hollice Espy, MD;  Location: [facility] ORS;  Service: Urology;  Laterality: Right;   CYSTOSCOPY WITH URETEROSCOPY Right 02/06/2018   Procedure: CYSTOSCOPY WITH URETEROSCOPY;  Surgeon: Hollice Espy, MD;  Location: [facility] ORS;  Service: Urology;  Laterality: Right;   PORTA CATH INSERTION N/A 02/11/2018   Procedure: PORTA CATH INSERTION;  Surgeon: Algernon Huxley, MD;  Location: [place] CV LAB;  Service: Cardiovascular;  Laterality: N/A;   THYROID SURGERY     nodule removed   THYROIDECTOMY     TONSILLECTOMY     URETERAL BIOPSY Right 02/06/2018   Procedure: URETERAL BIOPSY;  Surgeon: Hollice Espy, MD;  Location: [facility] ORS;  Service: Urology;  Laterality: Right;    HEMATOLOGY/ONCOLOGY HISTORY:    Urothelial carcinoma ([place])   02/12/2018 Initial Diagnosis    Urothelial carcinoma ([place])    02/14/2018 - 03/27/2018 Chemotherapy    The patient had atezolizumab (TECENTRIQ) 1,200 mg in sodium chloride 0.9 % 250 mL chemo infusion, 1,200 mg, Intravenous, Once, 2 of 6 cycles Administration: 1,200 mg (02/14/2018), 1,200 mg (03/07/2018)  for chemotherapy treatment.     03/28/2018 -  Chemotherapy    The patient had gemcitabine (GEMZAR) 1,300 mg in sodium chloride 0.9 % 250 mL chemo infusion, 1,292 mg (100 % of original dose 800 mg/m2), Intravenous,  Once, 5 of 8 cycles Dose modification:  800 mg/m2 (original dose 800 mg/m2, Cycle 1, Reason: Patient Age) Administration: 1,300 mg (03/28/2018), 1,300 mg (04/04/2018), 1,300 mg (04/18/2018), 1,300 mg (05/02/2018), 1,300 mg (05/16/2018), 1,300 mg (05/30/2018), 1,300 mg (06/13/2018), 1,300 mg (06/27/2018), 1,300 mg (07/11/2018)  for chemotherapy treatment.      ALLERGIES:  is allergic to demerol [meperidine] and sulfa antibiotics.  MEDICATIONS:  Current Outpatient Medications    Medication Sig Dispense Refill   acetaminophen (TYLENOL) 325 MG tablet Take 325 mg by mouth every 6 (six) hours as needed.     albuterol (PROVENTIL HFA;VENTOLIN HFA) 108 (90 Base) MCG/ACT inhaler Inhale into the lungs.     budesonide (PULMICORT FLEXHALER) 180 MCG/ACT inhaler Inhale into the lungs.     Cyanocobalamin 2500 MCG SUBL Place under the tongue.     Eliquis DVT/PE Starter Pack (ELIQUIS STARTER PACK) 5 MG TABS Take as directed on package: start with two-19m tablets twice daily for 7 days. On day 8, switch to one-571mtablet twice daily. 1 each 0   famotidine (PEPCID) 40 MG tablet Take by mouth.     fluticasone (FLONASE) 50 MCG/ACT nasal spray USE 2 SPRAYS INTO BOTH     NOSTRILS NIGHTLY     HYDROcodone-acetaminophen (NORCO/VICODIN) 5-325 MG tablet TK 1 T PO Q 6 H PRN  0   hydrocortisone (ANUSOL-HC) 25 MG suppository Place 1 suppository (25 mg total) rectally 2 (two) times daily. 12 suppository 0   mirabegron ER (MYRBETRIQ) 25 MG TB24 tablet Take 1 tablet (25 mg total) by mouth daily. 30 tablet 4   montelukast (SINGULAIR) 10 MG tablet TAKE 1 TABLET DAILY     Multiple Vitamin (MULTI-VITAMINS) TABS Take by mouth.     Multiple Vitamins-Minerals (MULTIVITAMIN ADULT PO) Take by mouth.     ondansetron (ZOFRAN) 8 MG tablet Take 1 tablet (8 mg total) by mouth 2 (two) times daily as needed (Nausea or vomiting). 30 tablet 2   predniSONE (STERAPRED UNI-PAK 21 TAB) 10 MG (21) TBPK tablet Take as directed. 21 tablet 0   prochlorperazine (COMPAZINE) 10 MG tablet Take 1 tablet (10 mg total) by mouth every 6 (six) hours as needed (Nausea or vomiting). 60 tablet 2   ranitidine (ZANTAC) 300 MG tablet Take 1 tablet by mouth 1 day or 1 dose.     sulfamethoxazole-trimethoprim (BACTRIM DS) 800-160 MG tablet Take 1 tablet by mouth 2 (two) times daily. 14 tablet 0   tamsulosin (FLOMAX) 0.4 MG CAPS capsule Take by mouth.     No current facility-administered medications for this visit.      VITAL SIGNS: LMP  (LMP Unknown)  There were no vitals filed for this visit.  Estimated body mass index is 26.07 kg/m as calculated from the following:   Height as of an earlier encounter on 08/01/18: _0  (1.549 m).   Weight as of an earlier encounter on 08/01/18: 138 lb (62.6 kg).  LABS: CBC:    Component Value Date/Time   WBC 6.7 08/01/2018 0923   HGB 7.4 (L) 08/01/2018 0923   HGB 12.9 04/30/2013 2116   HCT 22.3 (L) 08/01/2018 0923   HCT 39.1 04/30/2013 2116   PLT 186 08/01/2018 0923   PLT 229 04/30/2013 2116   MCV 109.3 (H) 08/01/2018 0923   MCV 93 04/30/2013 2116   NEUTROABS 5.0 08/01/2018 0923   LYMPHSABS 0.8 08/01/2018 0923   MONOABS 0.7 08/01/2018 0923   EOSABS 0.2 08/01/2018 0923   BASOSABS 0.1 08/01/2018 0923   Comprehensive Metabolic Panel:    Component Value  Date/Time   NA 135 08/01/2018 0923   NA 140 04/30/2013 2116   K 4.0 08/01/2018 0923   K 3.8 04/30/2013 2116   CL 109 08/01/2018 0923   CL 107 04/30/2013 2116   CO2 18 (L) 08/01/2018 0923   CO2 29 04/30/2013 2116   BUN 36 (H) 08/01/2018 0923   BUN 25 (H) 04/30/2013 2116   CREATININE 2.00 (H) 08/01/2018 0923   CREATININE 1.01 04/30/2013 2116   GLUCOSE 163 (H) 08/01/2018 0923   GLUCOSE 126 (H) 04/30/2013 2116   CALCIUM 8.1 (L) 08/01/2018 0923   CALCIUM 8.9 04/30/2013 2116   AST 30 08/01/2018 0923   ALT 13 08/01/2018 0923   ALKPHOS 105 08/01/2018 0923   BILITOT 0.8 08/01/2018 0923   PROT 5.7 (L) 08/01/2018 0923   ALBUMIN 2.8 (L) 08/01/2018 0923    RADIOGRAPHIC STUDIES: Mr Brain Wo Contrast  Result Date: 07/30/2018 CLINICAL DATA:  Slurred speech, generalized weakness, and confusion. History of urothelial carcinoma. No IV contrast administered due to diminished renal function. EXAM: MRI HEAD WITHOUT CONTRAST TECHNIQUE: Multiplanar, multiecho pulse sequences of the brain and surrounding structures were obtained without intravenous contrast. COMPARISON:  Head CT 02/22/2015 FINDINGS: Brain: There is  no evidence of acute infarct, intracranial hemorrhage, mass, midline shift, or extra-axial fluid collection. Periventricular and deep cerebral white matter T2/FLAIR hyperintensities are nonspecific but compatible with minimal chronic small vessel ischemic disease. The ventricles and sulci are within normal limits for age. Vascular: Major intracranial vascular flow voids are preserved. Skull and upper cervical spine: No suspicious marrow lesion. Sinuses/Orbits: Bilateral cataract extraction. Mild right maxillary sinus mucosal thickening. Clear mastoid air cells. Other: 1.7 cm T2 hyperintense subcutaneous lesion in the left face, likely a sebaceous cyst. IMPRESSION: 1. No acute intracranial abnormality. 2. Minimal chronic small vessel ischemic disease. Electronically Signed   By: Logan Bores M.D.   On: 07/30/2018 18:03   Nm Pet Image Restag (ps) Skull Base To Thigh  Result Date: 07/09/2018 CLINICAL DATA:  Subsequent treatment strategy for urothelial carcinoma of the right ureter. Also with history of colon cancer. EXAM: NUCLEAR MEDICINE PET SKULL BASE TO THIGH TECHNIQUE: 7.1 mCi F-18 FDG was injected intravenously. Full-ring PET imaging was performed from the skull base to thigh after the radiotracer. CT data was obtained and used for attenuation correction and anatomic localization. Fasting blood glucose: 78 mg/dl COMPARISON:  01/04/2018. FINDINGS: Mediastinal blood pool activity: SUV max 2.0 NECK: No hypermetabolic lymphadenopathy. Relatively diffuse FDG accumulation in the thyroid gland is similar to prior. Incidental CT findings: none CHEST: 11 mm right paratracheal lymph has increased from 9 mm short axis previously. Low level FDG accumulation in this node is minimally above blood pool at 2.8. Small prevascular nodes are stable. Low level uptake in each hilum is unchanged. Incidental CT findings: Right Port-A-Cath tip is positioned at the SVC/RA junction. Coronary artery calcification is evident.  Atherosclerotic calcification is noted in the wall of the thoracic aorta. No suspicious pulmonary nodule or mass. Mosaic attenuation in the lungs likely reflects air trapping. Small bilateral pleural effusions are progressive in the interval. ABDOMEN/PELVIS: Stable abnormal appearance of the mid transverse colon, markedly hypermetabolic with SUV max = 13.0 today compared to 24.8 previously. Right hydroureteronephrosis persists. The abnormal hypermetabolism seen in the region of the mid right ureter is similar with SUV max = 9 today compared to 9.5 previously. The soft tissue lesion along the distal right ureter/right vaginal cuff is again noted to contain central calcification. Lesion measures 3.8 x  3.1 cm today compared to 3.8 x 3.6 cm previously. SUV max = 14.3 today compared to 11.7 previously. The right retrocaval lymph node measured previously at 2.1 cm is now 0.7 cm with no measurable hypermetabolism on today's study above background blood pool levels. Aortocaval lymph node measured previously at 2 cm short axis is now 0.8 cm without hypermetabolism. Right common iliac lymph node measured previously at 1.4 cm is now 0.8 cm. Hypermetabolism from the adjacent ureteral mass obscures is lymph node on PET imaging. The hypermetabolic nodule seen previously in the right paracolic gutter have resolved in the interval. Nodular areas of uptake are identified and central small bowel loops on today's study, nonspecific. Incidental CT findings: Interval progression of diffuse body wall edema. Asymmetric subcutaneous edema is identified in the right thigh. Aortic Atherosclerois (ICD10-170.0) SKELETON: No focal hypermetabolic activity to suggest skeletal metastasis. Incidental CT findings: none IMPRESSION: 1. Similar right-sided hydroureteronephrosis with no substantial change in the hypermetabolic lesion of the mid right ureter. 2. Similar appearance of the hypermetabolic transverse colon mass. 3. Retroperitoneal  lymphadenopathy in the right abdomen with the right pelvic sidewall lymphadenopathy described on the previous study has resolved based on size with no residual hypermetabolism evident today, consistent with response to therapy. 4. Calcified right pelvic mass along the distal right ureter and right vaginal cuff is stable in size and minimally progressed in hypermetabolism. 5. Interval resolution of the hypermetabolic nodules in the right paracolic gutter. 6. Interval progression of small bilateral pleural effusions. 7. Interval progression of body wall edema including new asymmetric edema in the right thigh. No evidence for DVT on lower extremity ultrasound of 04/11/2018, but given persistent right pelvic sidewall disease today repeat assessment may be warranted. 8. Similar appearance of diffuse uptake in the thyroid parenchyma. 9.  Aortic Atherosclerois (ICD10-170.0) Electronically Signed   By: Misty Stanley M.D.   On: 07/09/2018 12:20   US Venous Img Lower Unilateral Right  Result Date: 07/25/2018 CLINICAL DATA:  Right lower extremity edema. History of bladder cancer. Evaluate for DVT. EXAM: RIGHT LOWER EXTREMITY VENOUS DOPPLER ULTRASOUND TECHNIQUE: Gray-scale sonography with graded compression, as well as color Doppler and duplex ultrasound were performed to evaluate the lower extremity deep venous systems from the level of the common femoral vein and including the common femoral, femoral, profunda femoral, popliteal and calf veins including the posterior tibial, peroneal and gastrocnemius veins when visible. The superficial great saphenous vein was also interrogated. Spectral Doppler was utilized to evaluate flow at rest and with distal augmentation maneuvers in the common femoral, femoral and popliteal veins. COMPARISON:  Right lower extremity venous Doppler ultrasound-04/11/2018; 07/24/2014 FINDINGS: Contralateral Common Femoral Vein: Respiratory phasicity is normal and symmetric with the symptomatic side.  No evidence of thrombus. Normal compressibility. Common Femoral Vein: There is hypoechoic nonocclusive DVT within the distal aspect of the right femoral vein (representative images 8 and 11). Saphenofemoral Junction: No evidence of thrombus. Normal compressibility and flow on color Doppler imaging. Profunda Femoral Vein: There is hypoechoic occlusive thrombus within the interrogated portions the right deep femoral vein (image 18). Femoral Vein: There is hypoechoic nonocclusive thrombus within the proximal (image 20) and mid (image 23) aspects of the right femoral vein. The distal aspect the right femoral vein appears patent. Popliteal Vein: No evidence of thrombus. Normal compressibility, respiratory phasicity and response to augmentation. Calf Veins: No evidence of thrombus. Normal compressibility and flow on color Doppler imaging. Superficial Great Saphenous Vein: No evidence of thrombus. Normal compressibility. Venous Reflux:  None. Other Findings: Moderate amount of subcutaneous edema is noted at the level of the right lower leg and calf. IMPRESSION: Examination is positive for occlusive DVT involving the right deep femoral vein. Nonocclusive thrombus is also seen within the right common femoral vein as well as the proximal and mid aspects of the right superficial femoral vein. Electronically Signed   By: Sandi Mariscal M.D.   On: 07/25/2018 16:04    PERFORMANCE STATUS (ECOG) : 1 - Symptomatic but completely ambulatory  Review of Systems As noted above. Otherwise, a complete review of systems is negative.  Physical Exam General: NAD, frail appearing, thin Pulmonary: unlabored ABD: soft, nttp, BS + Extremities: trace edema BLEs Skin: no rashes Neurological: Weakness but otherwise nonfocal  IMPRESSION: I met today with patient today for routine follow-up.  Patient has had progressive weakness and is mostly chair/bedbound.  Gemcitabine has been on hold due to declining performance status.  Patient  was recently treated for UTI and weakness could be lingering effects from that or due to treatment effects.  Repeat UA was checked today and culture is pending.  Note increased serum creatinine.  Discussed importance of pushing oral fluids.  I called and spoke with patient's daughter.  Suggested home health involvement but daughter declined.  PLAN: Continue current prescription treatment RTC in 2 weeks   Patient expressed understanding and was in agreement with this plan. She also understands that She can call clinic at any time with any questions, concerns, or complaints.    Time Total: 15 minutes  Visit consisted of counseling and education dealing with the complex and emotionally intense issues of symptom management and palliative care in the setting of serious and potentially life-threatening illness.Greater than 50%  of this time was spent counseling and coordinating care related to the above assessment and plan.  Signed by: Altha Harm, PhD, NP-C [phone] (Work Cell)

## 2018-08-02 LAB — URINE CULTURE: Culture: NO GROWTH

## 2018-08-06 ENCOUNTER — Telehealth: Payer: Self-pay | Admitting: *Deleted

## 2018-08-06 DIAGNOSIS — K649 Unspecified hemorrhoids: Secondary | ICD-10-CM

## 2018-08-06 NOTE — Telephone Encounter (Signed)
Just seeing this.  Will call the daughter on Wednesday.

## 2018-08-06 NOTE — Telephone Encounter (Addendum)
Call from Glenna Fellows reporting that patient Kristi Thompson) is having lower abdominal and back pain and has no medications for pain. Please advise

## 2018-08-07 ENCOUNTER — Telehealth: Payer: Self-pay

## 2018-08-07 ENCOUNTER — Other Ambulatory Visit: Payer: Self-pay | Admitting: Oncology

## 2018-08-07 DIAGNOSIS — C679 Malignant neoplasm of bladder, unspecified: Secondary | ICD-10-CM

## 2018-08-07 MED ORDER — OXYCODONE HCL 10 MG PO TABS: 10.0000 mg | ORAL_TABLET | ORAL | 0 refills | Status: DC | PRN

## 2018-08-07 NOTE — Telephone Encounter (Signed)
I am working from home and do not have access to a fax machine

## 2018-08-07 NOTE — Telephone Encounter (Signed)
Daughter called again this morning stating patient is in constant pain and the medicine she has is not working

## 2018-08-07 NOTE — Telephone Encounter (Signed)
Referral to [facility] was sent.

## 2018-08-07 NOTE — Telephone Encounter (Signed)
I talked with daughter today. Plan is oxycodone 10mg , referral to [facility] surgery for evaluation of hemorrhoids, ct a/p.  kep f/u as scheduled.  I'll take care of the imaging and meds, can someone send the referal to [facility]?  Thanks!  -Octavia Bruckner

## 2018-08-08 ENCOUNTER — Other Ambulatory Visit: Payer: Self-pay

## 2018-08-08 ENCOUNTER — Inpatient Hospital Stay: Payer: Medicare Other

## 2018-08-08 ENCOUNTER — Other Ambulatory Visit: Payer: Medicare Other

## 2018-08-08 ENCOUNTER — Telehealth: Payer: Self-pay

## 2018-08-08 DIAGNOSIS — C689 Malignant neoplasm of urinary organ, unspecified: Secondary | ICD-10-CM

## 2018-08-08 DIAGNOSIS — C661 Malignant neoplasm of right ureter: Secondary | ICD-10-CM | POA: Diagnosis not present

## 2018-08-08 LAB — CBC WITH DIFFERENTIAL/PLATELET
Abs Immature Granulocytes: 0.03 10*3/uL (ref 0.00–0.07)
Basophils Absolute: 0.1 10*3/uL (ref 0.0–0.1)
Basophils Relative: 2 %
Eosinophils Absolute: 0.2 10*3/uL (ref 0.0–0.5)
Eosinophils Relative: 3 %
HCT: 24.8 % — ABNORMAL LOW (ref 36.0–46.0)
Hemoglobin: 8.2 g/dL — ABNORMAL LOW (ref 12.0–15.0)
Immature Granulocytes: 0 %
Lymphocytes Relative: 13 %
Lymphs Abs: 0.9 10*3/uL (ref 0.7–4.0)
MCH: 36.3 pg — ABNORMAL HIGH (ref 26.0–34.0)
MCHC: 33.1 g/dL (ref 30.0–36.0)
MCV: 109.7 fL — ABNORMAL HIGH (ref 80.0–100.0)
Monocytes Absolute: 0.6 10*3/uL (ref 0.1–1.0)
Monocytes Relative: 9 %
Neutro Abs: 5 10*3/uL (ref 1.7–7.7)
Neutrophils Relative %: 73 %
Platelets: 171 10*3/uL (ref 150–400)
RBC: 2.26 MIL/uL — ABNORMAL LOW (ref 3.87–5.11)
RDW: 15.5 % (ref 11.5–15.5)
WBC: 6.9 10*3/uL (ref 4.0–10.5)
nRBC: 0 % (ref 0.0–0.2)

## 2018-08-08 LAB — COMPREHENSIVE METABOLIC PANEL
ALT: 9 U/L (ref 0–44)
AST: 25 U/L (ref 15–41)
Albumin: 2.8 g/dL — ABNORMAL LOW (ref 3.5–5.0)
Alkaline Phosphatase: 106 U/L (ref 38–126)
Anion gap: 7 (ref 5–15)
BUN: 20 mg/dL (ref 8–23)
CO2: 21 mmol/L — ABNORMAL LOW (ref 22–32)
Calcium: 8.6 mg/dL — ABNORMAL LOW (ref 8.9–10.3)
Chloride: 107 mmol/L (ref 98–111)
Creatinine, Ser: 1.54 mg/dL — ABNORMAL HIGH (ref 0.44–1.00)
GFR calc Af Amer: 33 mL/min — ABNORMAL LOW (ref >60)
GFR calc non Af Amer: 29 mL/min — ABNORMAL LOW (ref >60)
Glucose, Bld: 141 mg/dL — ABNORMAL HIGH (ref 70–99)
Potassium: 4.1 mmol/L (ref 3.5–5.1)
Sodium: 135 mmol/L (ref 135–145)
Total Bilirubin: 1 mg/dL (ref 0.3–1.2)
Total Protein: 5.6 g/dL — ABNORMAL LOW (ref 6.5–8.1)

## 2018-08-08 MED ORDER — HEPARIN SOD (PORK) LOCK FLUSH 100 UNIT/ML IV SOLN
500.0000 [IU] | Freq: Once | INTRAVENOUS | Status: AC
  Administered 2018-08-08: 11:00:00 500 [IU] via INTRAVENOUS

## 2018-08-08 MED ORDER — SODIUM CHLORIDE 0.9 % IV SOLN
Freq: Once | INTRAVENOUS | Status: AC
  Administered 2018-08-08: 10:00:00 via INTRAVENOUS
  Filled 2018-08-08: qty 250

## 2018-08-08 MED ORDER — SODIUM CHLORIDE 0.9% FLUSH
10.0000 mL | INTRAVENOUS | Status: DC | PRN
  Administered 2018-08-08: 10 mL via INTRAVENOUS
  Filled 2018-08-08: qty 10

## 2018-08-08 NOTE — Telephone Encounter (Signed)
Called patient's daughter-Livana and told her that she had an appointment to see the General Surgeon at [facility] for her hemorrhoids. Roxanna stated that she would take her mother on June 3 rd at 10:00 AM to be seen. Sabreen had no further questions for me.

## 2018-08-09 NOTE — Progress Notes (Signed)
[place]  [phone] [phone]  ID: Kristi Thompson OB: [birth year]  MR#: [id number]  DUK#:[id number]  Patient Care Team: [name], MD as PCP - General (Internal Medicine) Clent Jacks, RN as Registered Nurse  CHIEF COMPLAINT: Urothelial carcinoma.  INTERVAL HISTORY: Patient returns to clinic today for further evaluation.  Due to systemic failure of CHL and Internet capability, patient was evaluated in the lobby.  Patient's daughter was on the phone for the entire visit.  She continues to have significantly decreased performance status with declining cognitive ability.  She has increased weakness and fatigue and a poor appetite.  She has no neurologic complaints this week.  She denies any fevers. She has no chest pain, shortness of breath, cough, or hemoptysis.  She denies any constipation or diarrhea, but continues to have lower abdominal/pelvic pain possibly secondary to hemorrhoids.  She denies any melena or hematochezia.  Patient feels generally terrible, but offers no further specific complaints today.  REVIEW OF SYSTEMS:   Review of Systems  Constitutional: Positive for malaise/fatigue. Negative for fever and weight loss.  Respiratory: Negative.  Negative for cough, hemoptysis and shortness of breath.   Cardiovascular: Positive for leg swelling. Negative for chest pain.  Gastrointestinal: Negative for abdominal pain, blood in stool, constipation, diarrhea, melena, nausea and vomiting.  Genitourinary: Negative for dysuria, frequency and hematuria.  Musculoskeletal: Negative.  Negative for back pain.  Skin: Negative.  Negative for rash.  Neurological: Positive for weakness. Negative for dizziness, speech change, focal weakness and headaches.  Psychiatric/Behavioral: Negative.  The patient is not nervous/anxious.     As per HPI. Otherwise, a complete review of systems is negative.  PAST MEDICAL HISTORY: Past Medical History:    Diagnosis Date   Asthma    Cancer ([place]) 1978   colon ca   COPD (chronic obstructive pulmonary disease) (HCC)    GERD (gastroesophageal reflux disease)    History of hiatal hernia    Urothelial cancer ([place]) 2019   Urothelial carcinoma    PAST SURGICAL HISTORY: Past Surgical History:  Procedure Laterality Date   ABDOMINAL HYSTERECTOMY     APPENDECTOMY     bowel obstruction     CATARACT EXTRACTION W/ INTRAOCULAR LENS  IMPLANT, BILATERAL     CHOLECYSTECTOMY     COLON SURGERY     colon cancer   COLONOSCOPY WITH PROPOFOL N/A 01/18/2018   Procedure: COLONOSCOPY WITH PROPOFOL;  Surgeon: Jonathon Bellows, MD;  Location: [facility] ENDOSCOPY;  Service: Gastroenterology;  Laterality: N/A;   CYSTOSCOPY W/ URETERAL STENT PLACEMENT Right 02/06/2018   Procedure: CYSTOSCOPY WITH RETROGRADE PYELOGRAM;  Surgeon: Hollice Espy, MD;  Location: [facility] ORS;  Service: Urology;  Laterality: Right;   CYSTOSCOPY WITH URETEROSCOPY Right 02/06/2018   Procedure: CYSTOSCOPY WITH URETEROSCOPY;  Surgeon: Hollice Espy, MD;  Location: [facility] ORS;  Service: Urology;  Laterality: Right;   PORTA CATH INSERTION N/A 02/11/2018   Procedure: PORTA CATH INSERTION;  Surgeon: Algernon Huxley, MD;  Location: [facility];  Service: Cardiovascular;  Laterality: N/A;   THYROID SURGERY     nodule removed   THYROIDECTOMY     TONSILLECTOMY     URETERAL BIOPSY Right 02/06/2018   Procedure: URETERAL BIOPSY;  Surgeon: Hollice Espy, MD;  Location: [facility] ORS;  Service: Urology;  Laterality: Right;    FAMILY HISTORY: Negative and noncontributory. No family history on file.  ADVANCED DIRECTIVES (Y/N):  N  HEALTH MAINTENANCE: Social History   Tobacco Use   Smoking status:  Never Smoker   Smokeless tobacco: Never Used  Substance Use Topics   Alcohol use: Yes    Comment: rarely   Drug use: No     Colonoscopy:  PAP:  Bone density:  Lipid panel:  Allergies  Allergen Reactions   Demerol  [Meperidine] Swelling   Sulfa Antibiotics Swelling    Current Outpatient Medications  Medication Sig Dispense Refill   acetaminophen (TYLENOL) 325 MG tablet Take 325 mg by mouth every 6 (six) hours as needed.     albuterol (PROVENTIL HFA;VENTOLIN HFA) 108 (90 Base) MCG/ACT inhaler Inhale into the lungs.     apixaban (ELIQUIS) 5 MG TABS tablet Take 1 tablet (5 mg total) by mouth 2 (two) times daily. Initiate once starter pack is completed. 60 tablet 5   budesonide (PULMICORT FLEXHALER) 180 MCG/ACT inhaler Inhale into the lungs.     Cyanocobalamin 2500 MCG SUBL Place under the tongue.     Eliquis DVT/PE Starter Pack (ELIQUIS STARTER PACK) 5 MG TABS Take as directed on package: start with two-7m tablets twice daily for 7 days. On day 8, switch to one-588mtablet twice daily. 1 each 0   famotidine (PEPCID) 40 MG tablet Take by mouth.     fentaNYL (DURAGESIC) 25 MCG/HR Place 1 patch onto the skin every 3 (three) days. 10 patch 0   fluticasone (FLONASE) 50 MCG/ACT nasal spray USE 2 SPRAYS INTO BOTH     NOSTRILS NIGHTLY     HYDROcodone-acetaminophen (NORCO/VICODIN) 5-325 MG tablet TK 1 T PO Q 6 H PRN  0   hydrocortisone (ANUSOL-HC) 25 MG suppository Place 1 suppository (25 mg total) rectally 2 (two) times daily. 12 suppository 0   mirabegron ER (MYRBETRIQ) 25 MG TB24 tablet Take 1 tablet (25 mg total) by mouth daily. 30 tablet 4   montelukast (SINGULAIR) 10 MG tablet TAKE 1 TABLET DAILY     Multiple Vitamin (MULTI-VITAMINS) TABS Take by mouth.     Multiple Vitamins-Minerals (MULTIVITAMIN ADULT PO) Take by mouth.     ondansetron (ZOFRAN) 8 MG tablet Take 1 tablet (8 mg total) by mouth 2 (two) times daily as needed (Nausea or vomiting). 30 tablet 2   Oxycodone HCl 10 MG TABS Take 1 tablet (10 mg total) by mouth every 4 (four) hours as needed. 60 tablet 0   predniSONE (STERAPRED UNI-PAK 21 TAB) 10 MG (21) TBPK tablet Take as directed. 21 tablet 0   prochlorperazine (COMPAZINE) 10 MG  tablet Take 1 tablet (10 mg total) by mouth every 6 (six) hours as needed (Nausea or vomiting). 60 tablet 2   ranitidine (ZANTAC) 300 MG tablet Take 1 tablet by mouth 1 day or 1 dose.     sulfamethoxazole-trimethoprim (BACTRIM DS) 800-160 MG tablet Take 1 tablet by mouth 2 (two) times daily. 14 tablet 0   tamsulosin (FLOMAX) 0.4 MG CAPS capsule Take by mouth.     No current facility-administered medications for this visit.     OBJECTIVE: There were no vitals filed for this visit.   There is no height or weight on file to calculate BMI.    ECOG FS:3 - Symptomatic, >50% confined to bed  General: Ill-appearing, no acute distress.  Sitting in a wheelchair. Eyes: Pink conjunctiva, anicteric sclera. Lungs: Clear to auscultation bilaterally. Heart: Regular rate and rhythm. No rubs, murmurs, or gallops. Abdomen: Soft, nontender, nondistended. No organomegaly noted, normoactive bowel sounds. Musculoskeletal: Increased right lower extremity edema. Neuro: Alert, answering all questions appropriately. Cranial nerves grossly intact. Skin: No rashes or  petechiae noted. Psych: Normal affect.  LAB RESULTS:  Lab Results  Component Value Date   NA 135 08/08/2018   K 4.1 08/08/2018   CL 107 08/08/2018   CO2 21 (L) 08/08/2018   GLUCOSE 141 (H) 08/08/2018   BUN 20 08/08/2018   CREATININE 1.54 (H) 08/08/2018   CALCIUM 8.6 (L) 08/08/2018   PROT 5.6 (L) 08/08/2018   ALBUMIN 2.8 (L) 08/08/2018   AST 25 08/08/2018   ALT 9 08/08/2018   ALKPHOS 106 08/08/2018   BILITOT 1.0 08/08/2018   GFRNONAA 29 (L) 08/08/2018   GFRAA 33 (L) 08/08/2018    Lab Results  Component Value Date   WBC 6.9 08/08/2018   NEUTROABS 5.0 08/08/2018   HGB 8.2 (L) 08/08/2018   HCT 24.8 (L) 08/08/2018   MCV 109.7 (H) 08/08/2018   PLT 171 08/08/2018     STUDIES: Ct Abdomen Pelvis Wo Contrast  Result Date: 08/12/2018 CLINICAL DATA:  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 Worsening weakness, fatigue  and lower abdominal pain. Restaging. EXAM: CT ABDOMEN AND PELVIS WITHOUT CONTRAST TECHNIQUE: Multidetector CT imaging of the abdomen and pelvis was performed following the standard protocol without IV contrast. COMPARISON:  07/09/2018 PET-CT.  01/18/2018 CT abdomen/pelvis. FINDINGS: Lower chest: Small dependent bilateral pleural effusions with dependent bibasilar atelectasis, mildly increased bilaterally. Coronary atherosclerosis. Hepatobiliary: Normal liver size. No liver mass. Cholecystectomy. No biliary ductal dilatation. Pancreas: Normal, with no mass or duct dilation. Spleen: Normal size. No mass. Adrenals/Urinary Tract: Normal adrenals. No appreciable change in severe chronic right hydroureteronephrosis to the level of the lower lumbar ureter. Stable asymmetric severe atrophy of the right renal parenchyma. No left hydronephrosis. No contour deforming renal masses. No renal stones. Irregular infiltrative solid right pelvic periureteric mass measures 5.8 x 5.1 cm at the level of the upper right pelvis (series 2/image 55), increased from 4.8 x 4.3 cm on 07/09/2018 PET-CT, and measures 5.7 x 5.0 cm at the level of the right ureterovesical junction (series 2/image 63), stable from 5.7 x 5.0 cm. No acute bladder abnormality. Stomach/Bowel: Normal non-distended stomach. Normal caliber small bowel with no small bowel wall thickening. Appendectomy. Oral contrast transits to the colon. Eccentric segmental wall thickening in the mid transverse colon (series 2/image 27) has not appreciably changed. No new sites of large bowel wall thickening. Moderate colorectal stool volume. Vascular/Lymphatic: Atherosclerotic nonaneurysmal abdominal aorta. Mildly enlarged 1.0 cm right posterior paracaval node (series 2/image 26), increased from 0.7 cm. No additional new pathologically enlarged abdominopelvic nodes. Reproductive: Hysterectomy.  No new adnexal masses. Other: No pneumoperitoneum or focal fluid collection. Small volume left  lower quadrant ascites is mildly increased. Stable mild presacral space edema. Anasarca is increased. Musculoskeletal: No aggressive appearing focal osseous lesions. Moderate lumbar spondylosis. IMPRESSION: 1. Mild right posterior paracaval adenopathy, mildly increased. Irregular infiltrative periureteric neoplasm at the level of the upper right pelvis is increased. Findings suggest mild tumor progression. 2. Eccentric segmental wall thickening in the mid transverse colon appears unchanged. 3. Small dependent bilateral pleural effusions, mildly increased bilaterally. Small volume left lower quadrant ascites is mildly increased. Anasarca is increased. 4. Chronic right hydroureteronephrosis and right renal atrophy is unchanged. 5.  Aortic Atherosclerosis (ICD10-I70.0). Electronically Signed   By: Ilona Sorrel M.D.   On: 08/12/2018 15:48   Mr Brain Wo Contrast  Result Date: 07/30/2018 CLINICAL DATA:  Slurred speech, generalized weakness, and confusion. History of urothelial carcinoma. No IV contrast administered due to diminished renal function. EXAM: MRI HEAD WITHOUT CONTRAST TECHNIQUE: Multiplanar, multiecho  pulse sequences of the brain and surrounding structures were obtained without intravenous contrast. COMPARISON:  Head CT 02/22/2015 FINDINGS: Brain: There is no evidence of acute infarct, intracranial hemorrhage, mass, midline shift, or extra-axial fluid collection. Periventricular and deep cerebral white matter T2/FLAIR hyperintensities are nonspecific but compatible with minimal chronic small vessel ischemic disease. The ventricles and sulci are within normal limits for age. Vascular: Major intracranial vascular flow voids are preserved. Skull and upper cervical spine: No suspicious marrow lesion. Sinuses/Orbits: Bilateral cataract extraction. Mild right maxillary sinus mucosal thickening. Clear mastoid air cells. Other: 1.7 cm T2 hyperintense subcutaneous lesion in the left face, likely a sebaceous cyst.  IMPRESSION: 1. No acute intracranial abnormality. 2. Minimal chronic small vessel ischemic disease. Electronically Signed   By: Logan Bores M.D.   On: 07/30/2018 18:03   US Venous Img Lower Unilateral Right  Result Date: 07/25/2018 CLINICAL DATA:  Right lower extremity edema. History of bladder cancer. Evaluate for DVT. EXAM: RIGHT LOWER EXTREMITY VENOUS DOPPLER ULTRASOUND TECHNIQUE: Gray-scale sonography with graded compression, as well as color Doppler and duplex ultrasound were performed to evaluate the lower extremity deep venous systems from the level of the common femoral vein and including the common femoral, femoral, profunda femoral, popliteal and calf veins including the posterior tibial, peroneal and gastrocnemius veins when visible. The superficial great saphenous vein was also interrogated. Spectral Doppler was utilized to evaluate flow at rest and with distal augmentation maneuvers in the common femoral, femoral and popliteal veins. COMPARISON:  Right lower extremity venous Doppler ultrasound-04/11/2018; 07/24/2014 FINDINGS: Contralateral Common Femoral Vein: Respiratory phasicity is normal and symmetric with the symptomatic side. No evidence of thrombus. Normal compressibility. Common Femoral Vein: There is hypoechoic nonocclusive DVT within the distal aspect of the right femoral vein (representative images 8 and 11). Saphenofemoral Junction: No evidence of thrombus. Normal compressibility and flow on color Doppler imaging. Profunda Femoral Vein: There is hypoechoic occlusive thrombus within the interrogated portions the right deep femoral vein (image 18). Femoral Vein: There is hypoechoic nonocclusive thrombus within the proximal (image 20) and mid (image 23) aspects of the right femoral vein. The distal aspect the right femoral vein appears patent. Popliteal Vein: No evidence of thrombus. Normal compressibility, respiratory phasicity and response to augmentation. Calf Veins: No evidence of  thrombus. Normal compressibility and flow on color Doppler imaging. Superficial Great Saphenous Vein: No evidence of thrombus. Normal compressibility. Venous Reflux:  None. Other Findings: Moderate amount of subcutaneous edema is noted at the level of the right lower leg and calf. IMPRESSION: Examination is positive for occlusive DVT involving the right deep femoral vein. Nonocclusive thrombus is also seen within the right common femoral vein as well as the proximal and mid aspects of the right superficial femoral vein. Electronically Signed   By: Sandi Mariscal M.D.   On: 07/25/2018 16:04    ASSESSMENT: Urothelial carcinoma, PDL-1 0%.  PLAN:    1.  Urothelial carcinoma: Case discussed with urology.  Cystoscopy on February 06, 2018 revealed a right ureteral mass.  Cytology report with high-grade urothelial carcinoma consistent with previous biopsy of abdominal mass.  Unfortunately, patient's PDL-1 is 0% therefore Tecentriq may not offer much benefit and was been discontinued. Initial plan was to give gemcitabine on days 1, 8, and 15 with a 22 off, but given problems with thrombocytopenia and neutropenia she can only tolerate treatment on days 1 and 15.  Given her advanced age and decreased kidney function, hesitant to add cisplatin.  Patient last received treatment  on July 11, 2018.  CT scan from August 12, 2018 reviewed independently and reported as above with only mild progression of disease, but no other significant pathology.  Continue to hold treatment given her declining performance status.  Return to clinic in 1 week for further evaluation.   2.  Hydronephrosis: Stent placement was not possible.  Nephrostomy tube was briefly discussed, but patient declined this at this time. 3.  Renal insufficiency: Lab work was not completed today, but her most recent creatinine had improved to 1.54.  This is approximately her baseline.  Likely secondary to obstruction from tumor.  No nephrostomy tubes at this time.  No  cisplatin as above.  Continue 1 L IV fluids with treatments.   4.  Anemia: Patient's hemoglobin from 1 week ago was reported as 8.2, but this may be falsely elevated secondary to intravascular dehydration.  Return to clinic in 1 week as above. 5.  Pain: Significantly worse.  CT scan results as above reveal no distinct etiology.  Patient was given a prescription for fentanyl 25 mcg patch and instructed to increase her dose of oxycodone. 6.  Right DVT: Continue Eliquis as prescribed. 7.  Confusion: MRI of the brain on Jul 30, 2018 reviewed independently with no obvious intracranial abnormality.  Recent U/A did not reveal infection.  Possibly secondary to overall declining performance status.  Monitor.   Patient expressed understanding and was in agreement with this plan. She also understands that She can call clinic at any time with any questions, concerns, or complaints.   Cancer Staging No matching staging information was found for the patient.  Lloyd Huger, MD   08/17/2018 7:33 AM

## 2018-08-12 ENCOUNTER — Ambulatory Visit
Admission: RE | Admit: 2018-08-12 | Discharge: 2018-08-12 | Disposition: A | Discharge status: Home / Self Care | Payer: Medicare Other | Source: Ambulatory Visit | Attending: Oncology | Admitting: Oncology

## 2018-08-12 ENCOUNTER — Other Ambulatory Visit: Payer: Self-pay

## 2018-08-12 DIAGNOSIS — C679 Malignant neoplasm of bladder, unspecified: Secondary | ICD-10-CM | POA: Diagnosis present

## 2018-08-15 ENCOUNTER — Inpatient Hospital Stay: Payer: Medicare Other | Attending: Oncology

## 2018-08-15 ENCOUNTER — Inpatient Hospital Stay (HOSPITAL_BASED_OUTPATIENT_CLINIC_OR_DEPARTMENT_OTHER): Payer: Medicare Other | Admitting: Oncology

## 2018-08-15 ENCOUNTER — Other Ambulatory Visit: Payer: Self-pay

## 2018-08-15 ENCOUNTER — Telehealth: Payer: Self-pay

## 2018-08-15 ENCOUNTER — Inpatient Hospital Stay: Payer: Medicare Other | Admitting: Nurse Practitioner

## 2018-08-15 ENCOUNTER — Inpatient Hospital Stay: Payer: Medicare Other

## 2018-08-15 ENCOUNTER — Other Ambulatory Visit: Payer: Self-pay | Admitting: *Deleted

## 2018-08-15 DIAGNOSIS — K219 Gastro-esophageal reflux disease without esophagitis: Secondary | ICD-10-CM | POA: Diagnosis not present

## 2018-08-15 DIAGNOSIS — D649 Anemia, unspecified: Secondary | ICD-10-CM

## 2018-08-15 DIAGNOSIS — R5383 Other fatigue: Secondary | ICD-10-CM | POA: Insufficient documentation

## 2018-08-15 DIAGNOSIS — R188 Other ascites: Secondary | ICD-10-CM

## 2018-08-15 DIAGNOSIS — Z7901 Long term (current) use of anticoagulants: Secondary | ICD-10-CM

## 2018-08-15 DIAGNOSIS — N1330 Unspecified hydronephrosis: Secondary | ICD-10-CM | POA: Diagnosis not present

## 2018-08-15 DIAGNOSIS — R531 Weakness: Secondary | ICD-10-CM | POA: Diagnosis not present

## 2018-08-15 DIAGNOSIS — R630 Anorexia: Secondary | ICD-10-CM | POA: Diagnosis not present

## 2018-08-15 DIAGNOSIS — R410 Disorientation, unspecified: Secondary | ICD-10-CM | POA: Diagnosis not present

## 2018-08-15 DIAGNOSIS — C661 Malignant neoplasm of right ureter: Secondary | ICD-10-CM

## 2018-08-15 DIAGNOSIS — N2889 Other specified disorders of kidney and ureter: Secondary | ICD-10-CM

## 2018-08-15 DIAGNOSIS — I2510 Atherosclerotic heart disease of native coronary artery without angina pectoris: Secondary | ICD-10-CM

## 2018-08-15 DIAGNOSIS — Z79899 Other long term (current) drug therapy: Secondary | ICD-10-CM

## 2018-08-15 DIAGNOSIS — J90 Pleural effusion, not elsewhere classified: Secondary | ICD-10-CM | POA: Insufficient documentation

## 2018-08-15 DIAGNOSIS — R601 Generalized edema: Secondary | ICD-10-CM | POA: Diagnosis not present

## 2018-08-15 DIAGNOSIS — J449 Chronic obstructive pulmonary disease, unspecified: Secondary | ICD-10-CM | POA: Diagnosis not present

## 2018-08-15 DIAGNOSIS — I82411 Acute embolism and thrombosis of right femoral vein: Secondary | ICD-10-CM | POA: Diagnosis not present

## 2018-08-15 DIAGNOSIS — C689 Malignant neoplasm of urinary organ, unspecified: Secondary | ICD-10-CM

## 2018-08-15 MED ORDER — FENTANYL 25 MCG/HR TD PT72: 1.0000 | MEDICATED_PATCH | TRANSDERMAL | 0 refills | Status: AC

## 2018-08-15 NOTE — Telephone Encounter (Signed)
Spoke with the patient to inform her that the PA was approved for the Fentanyl Patches 25 mcg Q 3 days. The patient was understanding and agreed

## 2018-08-16 ENCOUNTER — Telehealth: Payer: Self-pay | Admitting: Primary Care

## 2018-08-16 NOTE — Telephone Encounter (Signed)
Talked with patient's daughter Shanikka Wonders and have scheduled a Telephone Palliative Consult for 08/19/18 @ 3 PM.

## 2018-08-18 NOTE — Progress Notes (Deleted)
[place]  [phone] [phone]  ID: [name] OB: 30-Jun-1923  MR#: [id number]  UUV#:[id number]  Patient Care Team: [name], MD as PCP - General (Internal Medicine) Clent Jacks, RN as Registered Nurse  CHIEF COMPLAINT: Urothelial carcinoma.  INTERVAL HISTORY: Patient returns to clinic today for further evaluation.  Due to systemic failure of CHL and Internet capability, patient was evaluated in the lobby.  Patient's daughter was on the phone for the entire visit.  She continues to have significantly decreased performance status with declining cognitive ability.  She has increased weakness and fatigue and a poor appetite.  She has no neurologic complaints this week.  She denies any fevers. She has no chest pain, shortness of breath, cough, or hemoptysis.  She denies any constipation or diarrhea, but continues to have lower abdominal/pelvic pain possibly secondary to hemorrhoids.  She denies any melena or hematochezia.  Patient feels generally terrible, but offers no further specific complaints today.  REVIEW OF SYSTEMS:   Review of Systems  Constitutional: Positive for malaise/fatigue. Negative for fever and weight loss.  Respiratory: Negative.  Negative for cough, hemoptysis and shortness of breath.   Cardiovascular: Positive for leg swelling. Negative for chest pain.  Gastrointestinal: Negative for abdominal pain, blood in stool, constipation, diarrhea, melena, nausea and vomiting.  Genitourinary: Negative for dysuria, frequency and hematuria.  Musculoskeletal: Negative.  Negative for back pain.  Skin: Negative.  Negative for rash.  Neurological: Positive for weakness. Negative for dizziness, speech change, focal weakness and headaches.  Psychiatric/Behavioral: Negative.  The patient is not nervous/anxious.     As per HPI. Otherwise, a complete review of systems is negative.  PAST MEDICAL HISTORY: Past Medical History:    Diagnosis Date   Asthma    Cancer ([facility]) 1978   colon ca   COPD (chronic obstructive pulmonary disease) (HCC)    GERD (gastroesophageal reflux disease)    History of hiatal hernia    Urothelial cancer ([facility]) 2019   Urothelial carcinoma    PAST SURGICAL HISTORY: Past Surgical History:  Procedure Laterality Date   ABDOMINAL HYSTERECTOMY     APPENDECTOMY     bowel obstruction     CATARACT EXTRACTION W/ INTRAOCULAR LENS  IMPLANT, BILATERAL     CHOLECYSTECTOMY     COLON SURGERY     colon cancer   COLONOSCOPY WITH PROPOFOL N/A 01/18/2018   Procedure: COLONOSCOPY WITH PROPOFOL;  Surgeon: Jonathon Bellows, MD;  Location: [facility] ENDOSCOPY;  Service: Gastroenterology;  Laterality: N/A;   CYSTOSCOPY W/ URETERAL STENT PLACEMENT Right 02/06/2018   Procedure: CYSTOSCOPY WITH RETROGRADE PYELOGRAM;  Surgeon: Hollice Espy, MD;  Location: [facility] ORS;  Service: Urology;  Laterality: Right;   CYSTOSCOPY WITH URETEROSCOPY Right 02/06/2018   Procedure: CYSTOSCOPY WITH URETEROSCOPY;  Surgeon: Hollice Espy, MD;  Location: [facility] ORS;  Service: Urology;  Laterality: Right;   PORTA CATH INSERTION N/A 02/11/2018   Procedure: PORTA CATH INSERTION;  Surgeon: Algernon Huxley, MD;  Location: [facility] [facility] LAB;  Service: Cardiovascular;  Laterality: N/A;   THYROID SURGERY     nodule removed   THYROIDECTOMY     TONSILLECTOMY     URETERAL BIOPSY Right 02/06/2018   Procedure: URETERAL BIOPSY;  Surgeon: Hollice Espy, MD;  Location: [facility] ORS;  Service: Urology;  Laterality: Right;    FAMILY HISTORY: Negative and noncontributory. No family history on file.  ADVANCED DIRECTIVES (Y/N):  N  HEALTH MAINTENANCE: Social History   Tobacco Use   Smoking status:  Never Smoker   Smokeless tobacco: Never Used  Substance Use Topics   Alcohol use: Yes    Comment: rarely   Drug use: No     Colonoscopy:  PAP:  Bone density:  Lipid panel:  Allergies  Allergen Reactions   Demerol  [Meperidine] Swelling   Sulfa Antibiotics Swelling    Current Outpatient Medications  Medication Sig Dispense Refill   acetaminophen (TYLENOL) 325 MG tablet Take 325 mg by mouth every 6 (six) hours as needed.     albuterol (PROVENTIL HFA;VENTOLIN HFA) 108 (90 Base) MCG/ACT inhaler Inhale into the lungs.     apixaban (ELIQUIS) 5 MG TABS tablet Take 1 tablet (5 mg total) by mouth 2 (two) times daily. Initiate once starter pack is completed. 60 tablet 5   budesonide (PULMICORT FLEXHALER) 180 MCG/ACT inhaler Inhale into the lungs.     Cyanocobalamin 2500 MCG SUBL Place under the tongue.     Eliquis DVT/PE Starter Pack (ELIQUIS STARTER PACK) 5 MG TABS Take as directed on package: start with two-7m tablets twice daily for 7 days. On day 8, switch to one-588mtablet twice daily. 1 each 0   famotidine (PEPCID) 40 MG tablet Take by mouth.     fentaNYL (DURAGESIC) 25 MCG/HR Place 1 patch onto the skin every 3 (three) days. 10 patch 0   fluticasone (FLONASE) 50 MCG/ACT nasal spray USE 2 SPRAYS INTO BOTH     NOSTRILS NIGHTLY     HYDROcodone-acetaminophen (NORCO/VICODIN) 5-325 MG tablet TK 1 T PO Q 6 H PRN  0   hydrocortisone (ANUSOL-HC) 25 MG suppository Place 1 suppository (25 mg total) rectally 2 (two) times daily. 12 suppository 0   mirabegron ER (MYRBETRIQ) 25 MG TB24 tablet Take 1 tablet (25 mg total) by mouth daily. 30 tablet 4   montelukast (SINGULAIR) 10 MG tablet TAKE 1 TABLET DAILY     Multiple Vitamin (MULTI-VITAMINS) TABS Take by mouth.     Multiple Vitamins-Minerals (MULTIVITAMIN ADULT PO) Take by mouth.     ondansetron (ZOFRAN) 8 MG tablet Take 1 tablet (8 mg total) by mouth 2 (two) times daily as needed (Nausea or vomiting). 30 tablet 2   Oxycodone HCl 10 MG TABS Take 1 tablet (10 mg total) by mouth every 4 (four) hours as needed. 60 tablet 0   predniSONE (STERAPRED UNI-PAK 21 TAB) 10 MG (21) TBPK tablet Take as directed. 21 tablet 0   prochlorperazine (COMPAZINE) 10 MG  tablet Take 1 tablet (10 mg total) by mouth every 6 (six) hours as needed (Nausea or vomiting). 60 tablet 2   ranitidine (ZANTAC) 300 MG tablet Take 1 tablet by mouth 1 day or 1 dose.     sulfamethoxazole-trimethoprim (BACTRIM DS) 800-160 MG tablet Take 1 tablet by mouth 2 (two) times daily. 14 tablet 0   tamsulosin (FLOMAX) 0.4 MG CAPS capsule Take by mouth.     No current facility-administered medications for this visit.     OBJECTIVE: There were no vitals filed for this visit.   There is no height or weight on file to calculate BMI.    ECOG FS:3 - Symptomatic, >50% confined to bed  General: Ill-appearing, no acute distress.  Sitting in a wheelchair. Eyes: Pink conjunctiva, anicteric sclera. Lungs: Clear to auscultation bilaterally. Heart: Regular rate and rhythm. No rubs, murmurs, or gallops. Abdomen: Soft, nontender, nondistended. No organomegaly noted, normoactive bowel sounds. Musculoskeletal: Increased right lower extremity edema. Neuro: Alert, answering all questions appropriately. Cranial nerves grossly intact. Skin: No rashes or  petechiae noted. Psych: Normal affect.  LAB RESULTS:  Lab Results  Component Value Date   NA 135 08/08/2018   K 4.1 08/08/2018   CL 107 08/08/2018   CO2 21 (L) 08/08/2018   GLUCOSE 141 (H) 08/08/2018   BUN 20 08/08/2018   CREATININE 1.54 (H) 08/08/2018   CALCIUM 8.6 (L) 08/08/2018   PROT 5.6 (L) 08/08/2018   ALBUMIN 2.8 (L) 08/08/2018   AST 25 08/08/2018   ALT 9 08/08/2018   ALKPHOS 106 08/08/2018   BILITOT 1.0 08/08/2018   GFRNONAA 29 (L) 08/08/2018   GFRAA 33 (L) 08/08/2018    Lab Results  Component Value Date   WBC 6.9 08/08/2018   NEUTROABS 5.0 08/08/2018   HGB 8.2 (L) 08/08/2018   HCT 24.8 (L) 08/08/2018   MCV 109.7 (H) 08/08/2018   PLT 171 08/08/2018     STUDIES: Ct Abdomen Pelvis Wo Contrast  Result Date: 08/12/2018 CLINICAL DATA:  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 Worsening weakness, fatigue  and lower abdominal pain. Restaging. EXAM: CT ABDOMEN AND PELVIS WITHOUT CONTRAST TECHNIQUE: Multidetector CT imaging of the abdomen and pelvis was performed following the standard protocol without IV contrast. COMPARISON:  07/09/2018 PET-CT.  01/18/2018 CT abdomen/pelvis. FINDINGS: Lower chest: Small dependent bilateral pleural effusions with dependent bibasilar atelectasis, mildly increased bilaterally. Coronary atherosclerosis. Hepatobiliary: Normal liver size. No liver mass. Cholecystectomy. No biliary ductal dilatation. Pancreas: Normal, with no mass or duct dilation. Spleen: Normal size. No mass. Adrenals/Urinary Tract: Normal adrenals. No appreciable change in severe chronic right hydroureteronephrosis to the level of the lower lumbar ureter. Stable asymmetric severe atrophy of the right renal parenchyma. No left hydronephrosis. No contour deforming renal masses. No renal stones. Irregular infiltrative solid right pelvic periureteric mass measures 5.8 x 5.1 cm at the level of the upper right pelvis (series 2/image 55), increased from 4.8 x 4.3 cm on 07/09/2018 PET-CT, and measures 5.7 x 5.0 cm at the level of the right ureterovesical junction (series 2/image 63), stable from 5.7 x 5.0 cm. No acute bladder abnormality. Stomach/Bowel: Normal non-distended stomach. Normal caliber small bowel with no small bowel wall thickening. Appendectomy. Oral contrast transits to the colon. Eccentric segmental wall thickening in the mid transverse colon (series 2/image 27) has not appreciably changed. No new sites of large bowel wall thickening. Moderate colorectal stool volume. Vascular/Lymphatic: Atherosclerotic nonaneurysmal abdominal aorta. Mildly enlarged 1.0 cm right posterior paracaval node (series 2/image 26), increased from 0.7 cm. No additional new pathologically enlarged abdominopelvic nodes. Reproductive: Hysterectomy.  No new adnexal masses. Other: No pneumoperitoneum or focal fluid collection. Small volume left  lower quadrant ascites is mildly increased. Stable mild presacral space edema. Anasarca is increased. Musculoskeletal: No aggressive appearing focal osseous lesions. Moderate lumbar spondylosis. IMPRESSION: 1. Mild right posterior paracaval adenopathy, mildly increased. Irregular infiltrative periureteric neoplasm at the level of the upper right pelvis is increased. Findings suggest mild tumor progression. 2. Eccentric segmental wall thickening in the mid transverse colon appears unchanged. 3. Small dependent bilateral pleural effusions, mildly increased bilaterally. Small volume left lower quadrant ascites is mildly increased. Anasarca is increased. 4. Chronic right hydroureteronephrosis and right renal atrophy is unchanged. 5.  Aortic Atherosclerosis (ICD10-I70.0). Electronically Signed   By: Ilona Sorrel M.D.   On: 08/12/2018 15:48   Mr Brain Wo Contrast  Result Date: 07/30/2018 CLINICAL DATA:  Slurred speech, generalized weakness, and confusion. History of urothelial carcinoma. No IV contrast administered due to diminished renal function. EXAM: MRI HEAD WITHOUT CONTRAST TECHNIQUE: Multiplanar, multiecho  pulse sequences of the brain and surrounding structures were obtained without intravenous contrast. COMPARISON:  Head CT 02/22/2015 FINDINGS: Brain: There is no evidence of acute infarct, intracranial hemorrhage, mass, midline shift, or extra-axial fluid collection. Periventricular and deep cerebral white matter T2/FLAIR hyperintensities are nonspecific but compatible with minimal chronic small vessel ischemic disease. The ventricles and sulci are within normal limits for age. Vascular: Major intracranial vascular flow voids are preserved. Skull and upper cervical spine: No suspicious marrow lesion. Sinuses/Orbits: Bilateral cataract extraction. Mild right maxillary sinus mucosal thickening. Clear mastoid air cells. Other: 1.7 cm T2 hyperintense subcutaneous lesion in the left face, likely a sebaceous cyst.  IMPRESSION: 1. No acute intracranial abnormality. 2. Minimal chronic small vessel ischemic disease. Electronically Signed   By: Logan Bores M.D.   On: 07/30/2018 18:03   US Venous Img Lower Unilateral Right  Result Date: 07/25/2018 CLINICAL DATA:  Right lower extremity edema. History of bladder cancer. Evaluate for DVT. EXAM: RIGHT LOWER EXTREMITY VENOUS DOPPLER ULTRASOUND TECHNIQUE: Gray-scale sonography with graded compression, as well as color Doppler and duplex ultrasound were performed to evaluate the lower extremity deep venous systems from the level of the common femoral vein and including the common femoral, femoral, profunda femoral, popliteal and calf veins including the posterior tibial, peroneal and gastrocnemius veins when visible. The superficial great saphenous vein was also interrogated. Spectral Doppler was utilized to evaluate flow at rest and with distal augmentation maneuvers in the common femoral, femoral and popliteal veins. COMPARISON:  Right lower extremity venous Doppler ultrasound-04/11/2018; 07/24/2014 FINDINGS: Contralateral Common Femoral Vein: Respiratory phasicity is normal and symmetric with the symptomatic side. No evidence of thrombus. Normal compressibility. Common Femoral Vein: There is hypoechoic nonocclusive DVT within the distal aspect of the right femoral vein (representative images 8 and 11). Saphenofemoral Junction: No evidence of thrombus. Normal compressibility and flow on color Doppler imaging. Profunda Femoral Vein: There is hypoechoic occlusive thrombus within the interrogated portions the right deep femoral vein (image 18). Femoral Vein: There is hypoechoic nonocclusive thrombus within the proximal (image 20) and mid (image 23) aspects of the right femoral vein. The distal aspect the right femoral vein appears patent. Popliteal Vein: No evidence of thrombus. Normal compressibility, respiratory phasicity and response to augmentation. Calf Veins: No evidence of  thrombus. Normal compressibility and flow on color Doppler imaging. Superficial Great Saphenous Vein: No evidence of thrombus. Normal compressibility. Venous Reflux:  None. Other Findings: Moderate amount of subcutaneous edema is noted at the level of the right lower leg and calf. IMPRESSION: Examination is positive for occlusive DVT involving the right deep femoral vein. Nonocclusive thrombus is also seen within the right common femoral vein as well as the proximal and mid aspects of the right superficial femoral vein. Electronically Signed   By: Sandi Mariscal M.D.   On: 07/25/2018 16:04    ASSESSMENT: Urothelial carcinoma, PDL-1 0%.  PLAN:    1.  Urothelial carcinoma: Case discussed with urology.  Cystoscopy on February 06, 2018 revealed a right ureteral mass.  Cytology report with high-grade urothelial carcinoma consistent with previous biopsy of abdominal mass.  Unfortunately, patient's PDL-1 is 0% therefore Tecentriq may not offer much benefit and was been discontinued. Initial plan was to give gemcitabine on days 1, 8, and 15 with a 22 off, but given problems with thrombocytopenia and neutropenia she can only tolerate treatment on days 1 and 15.  Given her advanced age and decreased kidney function, hesitant to add cisplatin.  Patient last received treatment  on July 11, 2018.  CT scan from August 12, 2018 reviewed independently and reported as above with only mild progression of disease, but no other significant pathology.  Continue to hold treatment given her declining performance status.  Return to clinic in 1 week for further evaluation.   2.  Hydronephrosis: Stent placement was not possible.  Nephrostomy tube was briefly discussed, but patient declined this at this time. 3.  Renal insufficiency: Lab work was not completed today, but her most recent creatinine had improved to 1.54.  This is approximately her baseline.  Likely secondary to obstruction from tumor.  No nephrostomy tubes at this time.  No  cisplatin as above.  Continue 1 L IV fluids with treatments.   4.  Anemia: Patient's hemoglobin from 1 week ago was reported as 8.2, but this may be falsely elevated secondary to intravascular dehydration.  Return to clinic in 1 week as above. 5.  Pain: Significantly worse.  CT scan results as above reveal no distinct etiology.  Patient was given a prescription for fentanyl 25 mcg patch and instructed to increase her dose of oxycodone. 6.  Right DVT: Continue Eliquis as prescribed. 7.  Confusion: MRI of the brain on Jul 30, 2018 reviewed independently with no obvious intracranial abnormality.  Recent U/A did not reveal infection.  Possibly secondary to overall declining performance status.  Monitor.   Patient expressed understanding and was in agreement with this plan. She also understands that She can call clinic at any time with any questions, concerns, or complaints.   Cancer Staging No matching staging information was found for the patient.  Lloyd Huger, MD   08/18/2018 9:37 AM

## 2018-08-19 ENCOUNTER — Other Ambulatory Visit: Payer: Self-pay

## 2018-08-19 ENCOUNTER — Other Ambulatory Visit: Payer: Self-pay | Admitting: Oncology

## 2018-08-19 ENCOUNTER — Other Ambulatory Visit: Payer: Medicare Other | Admitting: Primary Care

## 2018-08-19 DIAGNOSIS — Z515 Encounter for palliative care: Secondary | ICD-10-CM

## 2018-08-19 MED ORDER — OXYCODONE HCL 10 MG PO TABS: 10.0000 mg | ORAL_TABLET | ORAL | 0 refills | Status: AC | PRN

## 2018-08-19 NOTE — Progress Notes (Signed)
[facility] Pall[facility] Consult Note Telephone: [phone]  Fax: [phone]  TELEHEALTH VISIT STATEMENT Due to the COVID-19 crisis, this visit was done via telemedicine from my office. It was initiated and consented to by this patient and/or family.  PATIENT NAME: Kristi Thompson DOB: 1923-11-07 MRN: [id number]  PRIMARY CARE PROVIDER:   [name], [place]  REFERRING PROVIDER:  Lloyd Huger, MD [address] [place], [place] [address]   [phone]  RESPONSIBLE PARTY:   Extended Emergency Contact Information Primary Emergency Contact: Miller,Devika Address: [address]          [place], [place] [address] [place] of [place] Phone: [phone] Relation: Daughter Secondary Emergency Contact: Murphy,Dorriea Address: [address] [place], [place] [address] [place] Phone: [phone] Work Phone: [phone] Relation: Granddaughter  Her[place] was asked to follow patient by consultation request of [name], MD. This is the follow up visit.  ASSESSMENT AND RECOMMENDATIONS:   1. Goals of Care: Maximize quality of life and symptom management.  2. Symptom Management:  Pain:  Needs pain management. Currently on fentanyl 25 mcg, with 10 mg oxycodone prn. Best pain number 2/10 to worst of 10/10. Triggered from movement, intermittent. 6 weeks ago pain escalated, cannot stand, has stopped chemo due to anemia but wishes to pursue.  Constipation: Bowels are loose generally but now are  constipated. Has not had a bm for 10 days. Taking metamucil daily but not taking many fluids. Taking Smooth laxative aka  PEG.Has been on colace in the past. Education provided for milk of magnesia or other laxative to take today for constipation. I also assessed her abdomen by phone report, daughter states there is no rigidity, vomiting or fever. Commensurate bowel program was not  established with increase of narcotic, which could be the source of pain and constipation.  Appetite/Intake : Poor due to constipation   3. Family Supports: Lives with daughter in her home, has an apartment in an [place] living building but cannot be alone now  4. Cognitive / Functional decline: Dec 2019 was able to walk 3 flights, then dx with bladder cancer. She has become very debilitated over 4-6 weeks. She is now bedbound and incontinent of bowel and bladder.  5. Advanced Care Directive: States she wants not to give up, she wants chemo. We discussed disease process and possibility of not tolerating the chemo. She has a low hgb, daughter was not sure if they'd offered transfusion. We discussed the concept of futile care, once disease had progressed. We discussed hospice services and when to call them. Patient is good candidate at this time for hospice if she is unable to continue the chemotherapy protocol, tbd.  Visit consisted of counseling and education dealing with the complex and emotionally intense issues of symptom management and palliative care in the setting of serious and potentially life-threatening illness.  6. Follow up Palliative Care Visit: Palliative care will continue to follow for goals of care clarification and symptom management. Return 2 days or prn for f/u with bowel program, pain management.  I spent 75 minutes providing this consultation,  from 1500 to 16:15 More than 50% of the time in this consultation was spent coordinating communication.   HISTORY OF PRESENT ILLNESS:  Kristi Thompson is a 85 y.o. year old female with multiple medical problems including asthma, h/o colon cancer, bladder cancer (urothelial) with metastatic disease,  COPD, mild cognitive dysfunction. Palliative Care was asked to help address goals of care.   CODE STATUS: TBD  PPS: 40% HOSPICE ELIGIBILITY/DIAGNOSIS: TBD  PAST MEDICAL HISTORY:  Past Medical History:  Diagnosis Date  . Asthma   .  Cancer ([facility]) 1978   colon ca  . COPD (chronic obstructive pulmonary disease) ([facility])   . GERD (gastroesophageal reflux disease)   . History of hiatal hernia   . Urothelial cancer ([facility]) 2019   Urothelial carcinoma    SOCIAL HX:  Social History   Tobacco Use  . Smoking status: Never Smoker  . Smokeless tobacco: Never Used  Substance Use Topics  . Alcohol use: Yes    Comment: rarely    ALLERGIES:  Allergies  Allergen Reactions  . Demerol [Meperidine] Swelling  . Sulfa Antibiotics Swelling     PERTINENT MEDICATIONS:  Outpatient Encounter Medications as of 08/19/2018  Medication Sig  . acetaminophen (TYLENOL) 325 MG tablet Take 325 mg by mouth every 6 (six) hours as needed.  Marland Kitchen albuterol (PROVENTIL HFA;VENTOLIN HFA) 108 (90 Base) MCG/ACT inhaler Inhale into the lungs.  Marland Kitchen apixaban (ELIQUIS) 5 MG TABS tablet Take 1 tablet (5 mg total) by mouth 2 (two) times daily. Initiate once starter pack is completed.  . budesonide (PULMICORT FLEXHALER) 180 MCG/ACT inhaler Inhale into the lungs.  . Cyanocobalamin 2500 MCG SUBL Place under the tongue.  . Eliquis DVT/PE Starter Pack (ELIQUIS STARTER PACK) 5 MG TABS Take as directed on package: start with two-5mg  tablets twice daily for 7 days. On day 8, switch to one-5mg  tablet twice daily.  . famotidine (PEPCID) 40 MG tablet Take by mouth.  . fentaNYL (DURAGESIC) 25 MCG/HR Place 1 patch onto the skin every 3 (three) days.  . fluticasone (FLONASE) 50 MCG/ACT nasal spray USE 2 SPRAYS INTO BOTH     NOSTRILS NIGHTLY  . HYDROcodone-acetaminophen (NORCO/VICODIN) 5-325 MG tablet TK 1 T PO Q 6 H PRN  . hydrocortisone (ANUSOL-HC) 25 MG suppository Place 1 suppository (25 mg total) rectally 2 (two) times daily.  . mirabegron ER (MYRBETRIQ) 25 MG TB24 tablet Take 1 tablet (25 mg total) by mouth daily.  . montelukast (SINGULAIR) 10 MG tablet TAKE 1 TABLET DAILY  . Multiple Vitamin (MULTI-VITAMINS) TABS Take by mouth.  . Multiple Vitamins-Minerals (MULTIVITAMIN  ADULT PO) Take by mouth.  . ondansetron (ZOFRAN) 8 MG tablet Take 1 tablet (8 mg total) by mouth 2 (two) times daily as needed (Nausea or vomiting).  . Oxycodone HCl 10 MG TABS Take 1 tablet (10 mg total) by mouth every 4 (four) hours as needed.  . predniSONE (STERAPRED UNI-PAK 21 TAB) 10 MG (21) TBPK tablet Take as directed.  . prochlorperazine (COMPAZINE) 10 MG tablet Take 1 tablet (10 mg total) by mouth every 6 (six) hours as needed (Nausea or vomiting).  . ranitidine (ZANTAC) 300 MG tablet Take 1 tablet by mouth 1 day or 1 dose.  . sulfamethoxazole-trimethoprim (BACTRIM DS) 800-160 MG tablet Take 1 tablet by mouth 2 (two) times daily.  . tamsulosin (FLOMAX) 0.4 MG CAPS capsule Take by mouth.   No facility-administered encounter medications on file as of 08/19/2018.     PHYSICAL EXAM/ROS:   Current and past weights:  Weights unavailable General: NAD, frail, poor intake Cardiovascular: no chest pain reported, no edema reported Pulmonary: no cough, no increased SOB Abdomen: appetite fair, endorses constipation x 10 days, incontinent of bowel GU: denies dysuria, incontinent of urine MSK:  No longer ambulatory Skin: no rashes or wounds  reported Neurological: Weakness,night wakening, delirium episodes, Pain 2-10/10  [place], AGPCNP-BC

## 2018-08-20 ENCOUNTER — Encounter: Payer: Self-pay | Admitting: Emergency Medicine

## 2018-08-20 ENCOUNTER — Inpatient Hospital Stay
Admission: EM | Admit: 2018-08-20 | Discharge: 2018-08-23 | DRG: 663 | Disposition: A | Discharge status: Hospice | Payer: Medicare Other | Attending: Internal Medicine | Admitting: Internal Medicine

## 2018-08-20 ENCOUNTER — Other Ambulatory Visit: Payer: Self-pay

## 2018-08-20 DIAGNOSIS — Z7951 Long term (current) use of inhaled steroids: Secondary | ICD-10-CM

## 2018-08-20 DIAGNOSIS — K219 Gastro-esophageal reflux disease without esophagitis: Secondary | ICD-10-CM | POA: Diagnosis present

## 2018-08-20 DIAGNOSIS — Z86718 Personal history of other venous thrombosis and embolism: Secondary | ICD-10-CM | POA: Diagnosis not present

## 2018-08-20 DIAGNOSIS — Z1159 Encounter for screening for other viral diseases: Secondary | ICD-10-CM | POA: Diagnosis not present

## 2018-08-20 DIAGNOSIS — Z9221 Personal history of antineoplastic chemotherapy: Secondary | ICD-10-CM | POA: Diagnosis not present

## 2018-08-20 DIAGNOSIS — N189 Chronic kidney disease, unspecified: Secondary | ICD-10-CM | POA: Diagnosis not present

## 2018-08-20 DIAGNOSIS — D649 Anemia, unspecified: Secondary | ICD-10-CM | POA: Diagnosis not present

## 2018-08-20 DIAGNOSIS — D62 Acute posthemorrhagic anemia: Secondary | ICD-10-CM

## 2018-08-20 DIAGNOSIS — J449 Chronic obstructive pulmonary disease, unspecified: Secondary | ICD-10-CM | POA: Diagnosis present

## 2018-08-20 DIAGNOSIS — C679 Malignant neoplasm of bladder, unspecified: Secondary | ICD-10-CM | POA: Diagnosis not present

## 2018-08-20 DIAGNOSIS — Z8551 Personal history of malignant neoplasm of bladder: Secondary | ICD-10-CM | POA: Diagnosis not present

## 2018-08-20 DIAGNOSIS — Z66 Do not resuscitate: Secondary | ICD-10-CM | POA: Diagnosis present

## 2018-08-20 DIAGNOSIS — N183 Chronic kidney disease, stage 3 (moderate): Secondary | ICD-10-CM | POA: Diagnosis present

## 2018-08-20 DIAGNOSIS — N179 Acute kidney failure, unspecified: Secondary | ICD-10-CM | POA: Diagnosis present

## 2018-08-20 DIAGNOSIS — Z9049 Acquired absence of other specified parts of digestive tract: Secondary | ICD-10-CM | POA: Diagnosis not present

## 2018-08-20 DIAGNOSIS — N131 Hydronephrosis with ureteral stricture, not elsewhere classified: Secondary | ICD-10-CM | POA: Diagnosis present

## 2018-08-20 DIAGNOSIS — C689 Malignant neoplasm of urinary organ, unspecified: Secondary | ICD-10-CM | POA: Diagnosis present

## 2018-08-20 DIAGNOSIS — Z7901 Long term (current) use of anticoagulants: Secondary | ICD-10-CM | POA: Diagnosis not present

## 2018-08-20 DIAGNOSIS — Z79891 Long term (current) use of opiate analgesic: Secondary | ICD-10-CM

## 2018-08-20 DIAGNOSIS — Z79899 Other long term (current) drug therapy: Secondary | ICD-10-CM | POA: Diagnosis not present

## 2018-08-20 DIAGNOSIS — Z85038 Personal history of other malignant neoplasm of large intestine: Secondary | ICD-10-CM

## 2018-08-20 DIAGNOSIS — R319 Hematuria, unspecified: Secondary | ICD-10-CM | POA: Diagnosis not present

## 2018-08-20 DIAGNOSIS — Z515 Encounter for palliative care: Secondary | ICD-10-CM

## 2018-08-20 DIAGNOSIS — R310 Gross hematuria: Secondary | ICD-10-CM | POA: Diagnosis present

## 2018-08-20 DIAGNOSIS — D500 Iron deficiency anemia secondary to blood loss (chronic): Secondary | ICD-10-CM | POA: Diagnosis present

## 2018-08-20 DIAGNOSIS — Z90710 Acquired absence of both cervix and uterus: Secondary | ICD-10-CM | POA: Diagnosis not present

## 2018-08-20 DIAGNOSIS — Z9089 Acquired absence of other organs: Secondary | ICD-10-CM | POA: Diagnosis not present

## 2018-08-20 DIAGNOSIS — R413 Other amnesia: Secondary | ICD-10-CM | POA: Diagnosis not present

## 2018-08-20 DIAGNOSIS — Z7952 Long term (current) use of systemic steroids: Secondary | ICD-10-CM | POA: Diagnosis not present

## 2018-08-20 DIAGNOSIS — Z960 Presence of urogenital implants: Secondary | ICD-10-CM | POA: Diagnosis not present

## 2018-08-20 LAB — URINALYSIS, COMPLETE (UACMP) WITH MICROSCOPIC
Bacteria, UA: NONE SEEN
RBC / HPF: >50 RBC/hpf — ABNORMAL HIGH (ref 0–5)
Squamous Epithelial / HPF: NONE SEEN (ref 0–5)
WBC, UA: >50 WBC/hpf — ABNORMAL HIGH (ref 0–5)

## 2018-08-20 LAB — APTT: aPTT: 36 seconds (ref 24–36)

## 2018-08-20 LAB — CBC WITH DIFFERENTIAL/PLATELET
Abs Immature Granulocytes: 0.03 10*3/uL (ref 0.00–0.07)
Basophils Absolute: 0.1 10*3/uL (ref 0.0–0.1)
Basophils Relative: 1 %
Eosinophils Absolute: 0.1 10*3/uL (ref 0.0–0.5)
Eosinophils Relative: 1 %
HCT: 26.8 % — ABNORMAL LOW (ref 36.0–46.0)
Hemoglobin: 8.8 g/dL — ABNORMAL LOW (ref 12.0–15.0)
Immature Granulocytes: 0 %
Lymphocytes Relative: 10 %
Lymphs Abs: 1 10*3/uL (ref 0.7–4.0)
MCH: 34.5 pg — ABNORMAL HIGH (ref 26.0–34.0)
MCHC: 32.8 g/dL (ref 30.0–36.0)
MCV: 105.1 fL — ABNORMAL HIGH (ref 80.0–100.0)
Monocytes Absolute: 0.8 10*3/uL (ref 0.1–1.0)
Monocytes Relative: 9 %
Neutro Abs: 7.5 10*3/uL (ref 1.7–7.7)
Neutrophils Relative %: 79 %
Platelets: 176 10*3/uL (ref 150–400)
RBC: 2.55 MIL/uL — ABNORMAL LOW (ref 3.87–5.11)
RDW: 13.3 % (ref 11.5–15.5)
WBC: 9.5 10*3/uL (ref 4.0–10.5)
nRBC: 0 % (ref 0.0–0.2)

## 2018-08-20 LAB — COMPREHENSIVE METABOLIC PANEL
ALT: 10 U/L (ref 0–44)
AST: 23 U/L (ref 15–41)
Albumin: 2.7 g/dL — ABNORMAL LOW (ref 3.5–5.0)
Alkaline Phosphatase: 107 U/L (ref 38–126)
Anion gap: 6 (ref 5–15)
BUN: 25 mg/dL — ABNORMAL HIGH (ref 8–23)
CO2: 28 mmol/L (ref 22–32)
Calcium: 8.2 mg/dL — ABNORMAL LOW (ref 8.9–10.3)
Chloride: 101 mmol/L (ref 98–111)
Creatinine, Ser: 1.44 mg/dL — ABNORMAL HIGH (ref 0.44–1.00)
GFR calc Af Amer: 36 mL/min — ABNORMAL LOW (ref >60)
GFR calc non Af Amer: 31 mL/min — ABNORMAL LOW (ref >60)
Glucose, Bld: 124 mg/dL — ABNORMAL HIGH (ref 70–99)
Potassium: 3.8 mmol/L (ref 3.5–5.1)
Sodium: 135 mmol/L (ref 135–145)
Total Bilirubin: 1 mg/dL (ref 0.3–1.2)
Total Protein: 5.5 g/dL — ABNORMAL LOW (ref 6.5–8.1)

## 2018-08-20 LAB — HEMOGLOBIN AND HEMATOCRIT, BLOOD
HCT: 24.7 % — ABNORMAL LOW (ref 36.0–46.0)
Hemoglobin: 8.1 g/dL — ABNORMAL LOW (ref 12.0–15.0)

## 2018-08-20 LAB — PROTIME-INR
INR: 2.1 — ABNORMAL HIGH (ref 0.8–1.2)
Prothrombin Time: 22.9 seconds — ABNORMAL HIGH (ref 11.4–15.2)

## 2018-08-20 LAB — SARS CORONAVIRUS 2 BY RT PCR (HOSPITAL ORDER, PERFORMED IN ~~LOC~~ HOSPITAL LAB): SARS Coronavirus 2: NEGATIVE

## 2018-08-20 MED ORDER — ONDANSETRON HCL 4 MG PO TABS: 4.0000 mg | ORAL_TABLET | Freq: Four times a day (QID) | ORAL | Status: DC | PRN

## 2018-08-20 MED ORDER — ONDANSETRON HCL 4 MG PO TABS: 8.0000 mg | ORAL_TABLET | Freq: Two times a day (BID) | ORAL | Status: DC | PRN

## 2018-08-20 MED ORDER — FENTANYL 25 MCG/HR TD PT72
1.0000 | MEDICATED_PATCH | TRANSDERMAL | Status: DC
  Administered 2018-08-22: 10:00:00 1 via TRANSDERMAL
  Filled 2018-08-20 (×2): qty 1

## 2018-08-20 MED ORDER — FLEET ENEMA 7-19 GM/118ML RE ENEM
1.0000 | ENEMA | Freq: Once | RECTAL | Status: AC
  Administered 2018-08-20: 15:00:00 1 via RECTAL

## 2018-08-20 MED ORDER — DOCUSATE SODIUM 100 MG PO CAPS
100.0000 mg | ORAL_CAPSULE | Freq: Two times a day (BID) | ORAL | Status: DC
  Administered 2018-08-20 – 2018-08-23 (×6): 100 mg via ORAL
  Filled 2018-08-20 (×7): qty 1

## 2018-08-20 MED ORDER — TAMSULOSIN HCL 0.4 MG PO CAPS
0.4000 mg | ORAL_CAPSULE | Freq: Every day | ORAL | Status: DC
  Administered 2018-08-20 – 2018-08-22 (×3): 0.4 mg via ORAL
  Filled 2018-08-20 (×3): qty 1

## 2018-08-20 MED ORDER — MORPHINE SULFATE (PF) 2 MG/ML IV SOLN
2.0000 mg | Freq: Once | INTRAVENOUS | Status: AC
  Administered 2018-08-20: 2 mg via INTRAVENOUS
  Filled 2018-08-20: qty 1

## 2018-08-20 MED ORDER — MAGNESIUM HYDROXIDE 400 MG/5ML PO SUSP
30.0000 mL | Freq: Every day | ORAL | Status: DC | PRN
  Administered 2018-08-21: 30 mL via ORAL
  Filled 2018-08-20 (×2): qty 30

## 2018-08-20 MED ORDER — HYDROCORTISONE ACETATE 25 MG RE SUPP
25.0000 mg | Freq: Two times a day (BID) | RECTAL | Status: DC
  Administered 2018-08-21 (×2): 25 mg via RECTAL
  Filled 2018-08-20 (×9): qty 1

## 2018-08-20 MED ORDER — ALBUTEROL SULFATE (2.5 MG/3ML) 0.083% IN NEBU: 3.0000 mL | INHALATION_SOLUTION | RESPIRATORY_TRACT | Status: DC | PRN

## 2018-08-20 MED ORDER — ACETAMINOPHEN 650 MG RE SUPP: 650.0000 mg | Freq: Four times a day (QID) | RECTAL | Status: DC | PRN

## 2018-08-20 MED ORDER — BUDESONIDE 0.25 MG/2ML IN SUSP
2.0000 mL | Freq: Two times a day (BID) | RESPIRATORY_TRACT | Status: DC
  Administered 2018-08-20 – 2018-08-23 (×6): 0.25 mg via RESPIRATORY_TRACT
  Filled 2018-08-20 (×6): qty 2

## 2018-08-20 MED ORDER — MONTELUKAST SODIUM 10 MG PO TABS
10.0000 mg | ORAL_TABLET | Freq: Every day | ORAL | Status: DC
  Administered 2018-08-21 – 2018-08-22 (×2): 10 mg via ORAL
  Filled 2018-08-20 (×4): qty 1

## 2018-08-20 MED ORDER — TRAZODONE HCL 50 MG PO TABS: 25.0000 mg | ORAL_TABLET | Freq: Every evening | ORAL | Status: DC | PRN

## 2018-08-20 MED ORDER — MIRABEGRON ER 25 MG PO TB24
25.0000 mg | ORAL_TABLET | Freq: Every day | ORAL | Status: DC
  Administered 2018-08-20 – 2018-08-23 (×4): 25 mg via ORAL
  Filled 2018-08-20 (×6): qty 1

## 2018-08-20 MED ORDER — FAMOTIDINE 20 MG PO TABS: 40.0000 mg | ORAL_TABLET | Freq: Every day | ORAL | Status: DC

## 2018-08-20 MED ORDER — ADULT MULTIVITAMIN W/MINERALS CH
1.0000 | ORAL_TABLET | Freq: Every day | ORAL | Status: DC
  Administered 2018-08-20 – 2018-08-23 (×4): 1 via ORAL
  Filled 2018-08-20 (×4): qty 1

## 2018-08-20 MED ORDER — OXYCODONE HCL 5 MG PO TABS
10.0000 mg | ORAL_TABLET | ORAL | Status: DC | PRN
  Administered 2018-08-20 – 2018-08-23 (×7): 10 mg via ORAL
  Filled 2018-08-20 (×7): qty 2

## 2018-08-20 MED ORDER — FAMOTIDINE 20 MG PO TABS
10.0000 mg | ORAL_TABLET | Freq: Two times a day (BID) | ORAL | Status: DC
  Administered 2018-08-20 – 2018-08-23 (×6): 10 mg via ORAL
  Filled 2018-08-20 (×7): qty 1

## 2018-08-20 MED ORDER — SODIUM CHLORIDE 0.9 % IV SOLN
INTRAVENOUS | Status: DC
  Administered 2018-08-20: 12:00:00 via INTRAVENOUS

## 2018-08-20 MED ORDER — ACETAMINOPHEN 325 MG PO TABS: 650.0000 mg | ORAL_TABLET | Freq: Four times a day (QID) | ORAL | Status: DC | PRN

## 2018-08-20 MED ORDER — SULFAMETHOXAZOLE-TRIMETHOPRIM 800-160 MG PO TABS: 1.0000 | ORAL_TABLET | Freq: Two times a day (BID) | ORAL | Status: DC

## 2018-08-20 MED ORDER — CEPHALEXIN 500 MG PO CAPS
500.0000 mg | ORAL_CAPSULE | Freq: Once | ORAL | Status: AC
  Administered 2018-08-20: 500 mg via ORAL
  Filled 2018-08-20: qty 1

## 2018-08-20 MED ORDER — VITAMIN B-12 1000 MCG PO TABS
2500.0000 ug | ORAL_TABLET | Freq: Every day | ORAL | Status: DC
  Administered 2018-08-20 – 2018-08-23 (×4): 2500 ug via ORAL
  Filled 2018-08-20 (×3): qty 3
  Filled 2018-08-20: qty 2.5
  Filled 2018-08-20: qty 3

## 2018-08-20 MED ORDER — ONDANSETRON HCL 4 MG/2ML IJ SOLN: 4.0000 mg | Freq: Four times a day (QID) | INTRAMUSCULAR | Status: DC | PRN

## 2018-08-20 MED ORDER — HYDROCODONE-ACETAMINOPHEN 5-325 MG PO TABS
1.0000 | ORAL_TABLET | Freq: Four times a day (QID) | ORAL | Status: DC | PRN
  Filled 2018-08-20: qty 1

## 2018-08-20 MED ORDER — SENNA 8.6 MG PO TABS
2.0000 | ORAL_TABLET | Freq: Every day | ORAL | Status: DC
  Administered 2018-08-20 – 2018-08-23 (×4): 17.2 mg via ORAL
  Filled 2018-08-20 (×4): qty 2

## 2018-08-20 MED ORDER — ONDANSETRON HCL 4 MG/2ML IJ SOLN
4.0000 mg | Freq: Once | INTRAMUSCULAR | Status: AC
  Administered 2018-08-20: 4 mg via INTRAVENOUS
  Filled 2018-08-20: qty 2

## 2018-08-20 MED ORDER — PROCHLORPERAZINE MALEATE 10 MG PO TABS
10.0000 mg | ORAL_TABLET | Freq: Four times a day (QID) | ORAL | Status: DC | PRN
  Filled 2018-08-20: qty 1

## 2018-08-20 MED ORDER — FLUTICASONE PROPIONATE 50 MCG/ACT NA SUSP
2.0000 | Freq: Every day | NASAL | Status: DC
  Administered 2018-08-20 – 2018-08-22 (×3): 2 via NASAL
  Filled 2018-08-20 (×2): qty 16

## 2018-08-20 NOTE — ED Provider Notes (Addendum)
[facility] Emergency Department Provider Note  ____________________________________________  Time seen: Approximately 3:40 AM  I have reviewed the triage vital signs and the nursing notes.   HISTORY  Chief Complaint Hematuria  Level 5 caveat:  Portions of the history and physical were unable to be obtained due to hard of hearing   HPI Kristi Thompson is a 85 y.o. female with a history of urothelial cancer, anemia, DVT on Eliquis who presents for hematuria. Patient started with hematuria last night. Passing clots. Complaining of lower abdominal pain. Unclear if pain is worse than baseline.    Past Medical History:  Diagnosis Date  . Asthma   . Cancer ([place]) 1978   colon ca  . COPD (chronic obstructive pulmonary disease) ([place])   . GERD (gastroesophageal reflux disease)   . History of hiatal hernia   . Urothelial cancer ([place]) 2019   Urothelial carcinoma    Patient Active Problem List   Diagnosis Date Noted  . Iron deficiency anemia 04/26/2018  . Urothelial carcinoma ([place]) 02/12/2018  . 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 Asthma 02/01/2018  . Bowel obstruction ([place]) 02/01/2018  . COPD (chronic obstructive pulmonary disease) ([place]) 02/01/2018  . H/O spontaneous bacterial peritonitis 02/01/2018  . Hyperlipidemia 02/01/2018  . Nephrolithiasis 02/01/2018  . Pernicious anemia 02/01/2018  . Thyroid nodule 02/01/2018  . Carcinoma ([place]) 01/26/2018  . HTN, goal below 140/80 12/19/2017  . CKD (chronic kidney disease) stage 3, GFR 30-59 ml/min (HCC) 05/23/2017  . BCC (basal cell carcinoma), face 10/11/2015    Past Surgical History:  Procedure Laterality Date  . ABDOMINAL HYSTERECTOMY    . APPENDECTOMY    . bowel obstruction    . CATARACT EXTRACTION W/ INTRAOCULAR LENS  IMPLANT, BILATERAL    . CHOLECYSTECTOMY    . COLON SURGERY     colon cancer  . COLONOSCOPY WITH PROPOFOL N/A 01/18/2018   Procedure:  COLONOSCOPY WITH PROPOFOL;  Surgeon: Jonathon Bellows, MD;  Location: [facility] ENDOSCOPY;  Service: Gastroenterology;  Laterality: N/A;  . CYSTOSCOPY W/ URETERAL STENT PLACEMENT Right 02/06/2018   Procedure: CYSTOSCOPY WITH RETROGRADE PYELOGRAM;  Surgeon: Hollice Espy, MD;  Location: [facility] ORS;  Service: Urology;  Laterality: Right;  . CYSTOSCOPY WITH URETEROSCOPY Right 02/06/2018   Procedure: CYSTOSCOPY WITH URETEROSCOPY;  Surgeon: Hollice Espy, MD;  Location: [facility] ORS;  Service: Urology;  Laterality: Right;  . PORTA CATH INSERTION N/A 02/11/2018   Procedure: PORTA CATH INSERTION;  Surgeon: Algernon Huxley, MD;  Location: [place] CV LAB;  Service: Cardiovascular;  Laterality: N/A;  . THYROID SURGERY     nodule removed  . THYROIDECTOMY    . TONSILLECTOMY    . URETERAL BIOPSY Right 02/06/2018   Procedure: URETERAL BIOPSY;  Surgeon: Hollice Espy, MD;  Location: [facility] ORS;  Service: Urology;  Laterality: Right;    Prior to Admission medications   Medication Sig Start Date End Date Taking? Authorizing Provider  acetaminophen (TYLENOL) 325 MG tablet Take 325 mg by mouth every 6 (six) hours as needed.    [provider]  albuterol (PROVENTIL HFA;VENTOLIN HFA) 108 (90 Base) MCG/ACT inhaler Inhale into the lungs.    [provider]  apixaban (ELIQUIS) 5 MG TABS tablet Take 1 tablet (5 mg total) by mouth 2 (two) times daily. Initiate once starter pack is completed. 08/01/18   Lloyd Huger, MD  budesonide (PULMICORT FLEXHALER) 180 MCG/ACT inhaler Inhale into the lungs. 05/23/17 08/01/18  [provider]  Cyanocobalamin 2500 MCG SUBL Place under the tongue.    [provider]  Eliquis DVT/PE Starter Pack (ELIQUIS STARTER PACK) 5 MG TABS Take as directed on package: start with two-5mg  tablets twice daily for 7 days. On day 8, switch to one-5mg  tablet twice daily. 07/25/18   Lloyd Huger, MD  famotidine (PEPCID) 40 MG tablet Take by mouth. 12/19/17 12/19/18   [provider]  fentaNYL (DURAGESIC) 25 MCG/HR Place 1 patch onto the skin every 3 (three) days. 08/15/18   Lloyd Huger, MD  fluticasone (FLONASE) 50 MCG/ACT nasal spray USE 2 SPRAYS INTO BOTH     NOSTRILS NIGHTLY 06/13/17   [provider]  HYDROcodone-acetaminophen (NORCO/VICODIN) 5-325 MG tablet TK 1 T PO Q 6 H PRN 12/27/17   [provider]  hydrocortisone (ANUSOL-HC) 25 MG suppository Place 1 suppository (25 mg total) rectally 2 (two) times daily. 07/18/18   Lloyd Huger, MD  mirabegron ER (MYRBETRIQ) 25 MG TB24 tablet Take 1 tablet (25 mg total) by mouth daily. 02/06/18   Hollice Espy, MD  montelukast (SINGULAIR) 10 MG tablet TAKE 1 TABLET DAILY 07/16/16   [provider]  Multiple Vitamin (MULTI-VITAMINS) TABS Take by mouth.    [provider]  Multiple Vitamins-Minerals (MULTIVITAMIN ADULT PO) Take by mouth.    [provider]  ondansetron (ZOFRAN) 8 MG tablet Take 1 tablet (8 mg total) by mouth 2 (two) times daily as needed (Nausea or vomiting). 03/08/18   Lloyd Huger, MD  Oxycodone HCl 10 MG TABS Take 1 tablet (10 mg total) by mouth every 4 (four) hours as needed. 08/19/18   Lloyd Huger, MD  predniSONE (STERAPRED UNI-PAK 21 TAB) 10 MG (21) TBPK tablet Take as directed. 04/24/18   Jacquelin Hawking, NP  prochlorperazine (COMPAZINE) 10 MG tablet Take 1 tablet (10 mg total) by mouth every 6 (six) hours as needed (Nausea or vomiting). 03/08/18   Lloyd Huger, MD  ranitidine (ZANTAC) 300 MG tablet Take 1 tablet by mouth 1 day or 1 dose. 03/10/18   [provider]  sulfamethoxazole-trimethoprim (BACTRIM DS) 800-160 MG tablet Take 1 tablet by mouth 2 (two) times daily. 07/25/18   Lloyd Huger, MD  tamsulosin (FLOMAX) 0.4 MG CAPS capsule Take by mouth.    [provider]    Allergies Demerol [meperidine] and Sulfa antibiotics  No family history on file.  Social History Social History    Tobacco Use  . Smoking status: Never Smoker  . Smokeless tobacco: Never Used  Substance Use Topics  . Alcohol use: Yes    Comment: rarely  . Drug use: No    Review of Systems  Gastrointestinal: + lower abdominal pain Genitourinary: + hematuria  ____________________________________________   PHYSICAL EXAM:  VITAL SIGNS: ED Triage Vitals  Enc Vitals Group     BP 08/20/18 0120 128/66     Pulse Rate 08/20/18 0120 (!) 106     Resp 08/20/18 0120 20     Temp 08/20/18 0120 97.6 F (36.4 C)     Temp Source 08/20/18 0120 Oral     SpO2 08/20/18 0120 97 %     Weight 08/20/18 0121 138 lb 0.1 oz (62.6 kg)     Height 08/20/18 0121 5\' 1"  (1.549 m)     Head Circumference --      Peak Flow --      Pain Score 08/20/18 0121 7     Pain Loc --  Pain Edu? --      Excl. in [place]? --     Constitutional: Alert and oriented x 2,  no apparent distress. HEENT:      Head: Normocephalic and atraumatic.         Eyes: Conjunctivae are normal. Sclera is non-icteric.       Mouth/Throat: Mucous membranes are moist.       Neck: Supple with no signs of meningismus. Cardiovascular: Tachycardic with regular rate and rhythm. No murmurs, gallops, or rubs. 2+ symmetrical distal pulses are present in all extremities. No JVD. Respiratory: Normal respiratory effort. Lungs are clear to auscultation bilaterally. No wheezes, crackles, or rhonchi.  Gastrointestinal: Soft, suprapubic tenderness to palpation, and non distended with positive bowel sounds. No rebound or guarding. Genitourinary: No CVA tenderness. Musculoskeletal: Nontender with normal range of motion in all extremities. No edema, cyanosis, or erythema of extremities. Neurologic: Normal speech and language. Face is symmetric. Moving all extremities. No gross focal neurologic deficits are appreciated. Skin: Skin is warm, dry and intact. No rash noted. Psychiatric: Mood and affect are normal. Speech and behavior are normal.   ____________________________________________   LABS (all labs ordered are listed, but only abnormal results are displayed)  Labs Reviewed  CBC WITH DIFFERENTIAL/PLATELET - Abnormal; Notable for the following components:      Result Value   RBC 2.55 (*)    Hemoglobin 8.8 (*)    HCT 26.8 (*)    MCV 105.1 (*)    MCH 34.5 (*)    All other components within normal limits  URINALYSIS, COMPLETE (UACMP) WITH MICROSCOPIC - Abnormal; Notable for the following components:   Color, Urine RED (*)    APPearance CLOUDY (*)    Glucose, UA   (*)    Value: TEST NOT REPORTED DUE TO COLOR INTERFERENCE OF URINE PIGMENT   Hgb urine dipstick   (*)    Value: TEST NOT REPORTED DUE TO COLOR INTERFERENCE OF URINE PIGMENT   Bilirubin Urine   (*)    Value: TEST NOT REPORTED DUE TO COLOR INTERFERENCE OF URINE PIGMENT   Ketones, ur   (*)    Value: TEST NOT REPORTED DUE TO COLOR INTERFERENCE OF URINE PIGMENT   Protein, ur   (*)    Value: TEST NOT REPORTED DUE TO COLOR INTERFERENCE OF URINE PIGMENT   Nitrite   (*)    Value: TEST NOT REPORTED DUE TO COLOR INTERFERENCE OF URINE PIGMENT   Leukocytes,Ua   (*)    Value: TEST NOT REPORTED DUE TO COLOR INTERFERENCE OF URINE PIGMENT   RBC / HPF >50 (*)    WBC, UA >50 (*)    All other components within normal limits  COMPREHENSIVE METABOLIC PANEL - Abnormal; Notable for the following components:   Glucose, Bld 124 (*)    BUN 25 (*)    Creatinine, Ser 1.44 (*)    Calcium 8.2 (*)    Total Protein 5.5 (*)    Albumin 2.7 (*)    GFR calc non Af Amer 31 (*)    GFR calc Af Amer 36 (*)    All other components within normal limits  PROTIME-INR - Abnormal; Notable for the following components:   Prothrombin Time 22.9 (*)    INR 2.1 (*)    All other components within normal limits  HEMOGLOBIN AND HEMATOCRIT, BLOOD - Abnormal; Notable for the following components:   Hemoglobin 8.1 (*)    HCT 24.7 (*)    All other components within normal  limits  URINE CULTURE  APTT    ____________________________________________  EKG  none  ____________________________________________  RADIOLOGy   none __________________________________   PROCEDURES  Procedure(s) performed: yes Procedures   ------------------------------------------------------------------------------------------------------------------- Fecal Disimpaction Procedure Note:  Performed by me:  Patient placed in the lateral recumbent position with knees drawn towards chest. Nurse present for patient support. Large amount of hard brown stool removed. No complications during procedure.   ------------------------------------------------------------------------------------------------------------------   Critical Care performed:  None ____________________________________________   INITIAL IMPRESSION / ASSESSMENT AND PLAN / ED COURSE  85 y.o. female with a history of urothelial cancer, anemia, DVT on Eliquis who presents for hematuria since this evening. Mild tachycardic but normotensive. On Eliquis. Hgb stable at 8.8. Kidney function stable. Ddx UTI vs bleeding due to cancer. Will get UA, bladder scan to eval for retention. Will give morphine for pain.     _________________________ 5:57 AM on 08/20/2018 -----------------------------------------  UA is limited due to gross hematuria, no signs of sepsis.  Patient was given Keflex.  Her INR is elevated 2.1 but patient is not on Coumadin.  Therefore I do not believe giving vitamin K will be of any benefit.  The elevated INR is most likely due to her cancer.  Hemoglobin and hematocrit are trending down therefore patient will be admitted for monitoring. Discussed with Dr. Sidney Ace from admission.    As part of my medical decision making, I reviewed the following data within the [place] notes reviewed and incorporated, Labs reviewed , Old chart reviewed, Discussed with admitting physician , Notes from prior ED visits and [place]  Controlled Substance Database    Pertinent labs & imaging results that were available during my care of the patient were reviewed by me and considered in my medical decision making (see chart for details).    ____________________________________________   FINAL CLINICAL IMPRESSION(S) / ED DIAGNOSES  Final diagnoses:  Gross hematuria  Acute on chronic blood loss anemia      NEW MEDICATIONS STARTED DURING THIS VISIT:  ED Discharge Orders    None       Note:  This document was prepared using Dragon voice recognition software and may include unintentional dictation errors.    Alfred Levins, [place], MD 08/20/18 [place], [place], [place] 08/20/18 [phone]

## 2018-08-20 NOTE — Progress Notes (Signed)
Pt had medium BM after enema given, still noted to have a large hard stool in rectum, pt states that she is tired of trying to push it out and will try later, stool softners also added

## 2018-08-20 NOTE — ED Notes (Signed)
Pt had bowel movement and needed to go to the bathroom. Pt was placed on bedpan and could not make output. purewick placed.  Pt requested pain medicine.   Lm edt

## 2018-08-20 NOTE — ED Notes (Signed)
Report given to Amanda RN

## 2018-08-20 NOTE — ED Triage Notes (Addendum)
Pt to triage via w/c by EMS with no distress noted, mask in place; pt reports hx bladder CA, currently receiving chemo; hematuria tonight with lower back pain; st she is changing her attends every 47min

## 2018-08-20 NOTE — Consult Note (Signed)
Urology Consult  I have been asked to see the patient by [facility] , for evaluation and management of gross hematuria.  Chief Complaint: gross hematuria  History of Present Illness: Kristi Thompson is a 85 y.o. year old female with a history of high-grade metastatic upper tract urothelial carcinoma presenting to the emergency room with lower abdominal pain and gross hematuria x3 days.  She also has a history of constipation and was manually disimpacted in the ER as well.  She reports that she is been having gross hematuria over the past 3 days.  This is nonpainful.  She feels like she is able to empty her bladder.  She has had fecal and urinary incontinence.  Over the past several months, her functional status is significantly declined.  She is no longer physically as able and her mental capacity is also declined.  She is been having trouble with memory and attention per her own report today.  Had malaise, fatigue, leg swelling, weakness amongst other symptoms.  These have progressed.  She is now being followed by palliative care.    She was unable to receive to tecentriq given the lack of PDL 1 expression on her tumor.  She did receive palliative gemcitabine for period of time but this was stopped due to pancytopenia.  She is followed by Dr. Grayland Ormond.  Most recent CT scan about 10 days ago showed progression of both of her primary tumor as well as metastatic lymph node.  Urology was consulted for evaluation of gross hematuria.  Notably, upon arrival to the emergency room, her hemoglobin was 8.8 at 1:30 AM (up significantly from the previous week or baseline was previously 8.2).  Upon recheck early this morning, her hemoglobin did drop to 8.1.  She supposedly not received any IV fluids in the interim.  She is on Eliquis for history of DVT.  Her INR was also elevated 2.1.   Past Medical History:  Diagnosis Date  . Asthma   . Cancer ([place]) 1978   colon ca  . COPD (chronic obstructive  pulmonary disease) ([place])   . GERD (gastroesophageal reflux disease)   . History of hiatal hernia   . Urothelial cancer ([place]) 2019   Urothelial carcinoma    Past Surgical History:  Procedure Laterality Date  . ABDOMINAL HYSTERECTOMY    . APPENDECTOMY    . bowel obstruction    . CATARACT EXTRACTION W/ INTRAOCULAR LENS  IMPLANT, BILATERAL    . CHOLECYSTECTOMY    . COLON SURGERY     colon cancer  . COLONOSCOPY WITH PROPOFOL N/A 01/18/2018   Procedure: COLONOSCOPY WITH PROPOFOL;  Surgeon: Jonathon Bellows, MD;  [facility]: [facility] END[facility]COPY;  Service: Gastroenterology;  Laterality: N/A;  . CYSTOSCOPY W/ URETERAL STENT PLACEMENT Right 02/06/2018   Procedure: CYSTOSCOPY WITH RETROGRADE PYELOGRAM;  Surgeon: Hollice Espy, MD;  Location: [facility] ORS;  Service: Urology;  Laterality: Right;  . CYSTOSCOPY WITH URETEROSCOPY Right 02/06/2018   Procedure: CYSTOSCOPY WITH URETEROSCOPY;  Surgeon: Hollice Espy, MD;  Location: [facility] ORS;  Service: Urology;  Laterality: Right;  . PORTA CATH INSERTION N/A 02/11/2018   Procedure: PORTA CATH INSERTION;  Surgeon: Algernon Huxley, MD;  Location: [place] CV LAB;  Service: Cardiovascular;  Laterality: N/A;  . THYROID SURGERY     nodule removed  . THYROIDECTOMY    . TONSILLECTOMY    . URETERAL BIOPSY Right 02/06/2018   Procedure: URETERAL BIOPSY;  Surgeon: Hollice Espy, MD;  Location: [facility] ORS;  Service: Urology;  Laterality: Right;    Home Medications:  Current Meds  Medication Sig  . acetaminophen (TYLENOL) 325 MG tablet Take 325 mg by mouth every 6 (six) hours as needed.  Marland Kitchen apixaban (ELIQUIS) 5 MG TABS tablet Take 1 tablet (5 mg total) by mouth 2 (two) times daily. Initiate once starter pack is completed.  . budesonide (PULMICORT FLEXHALER) 180 MCG/ACT inhaler Inhale 1 puff into the lungs daily.   . Cyanocobalamin 5000 MCG SUBL Place 5,000 mcg under the tongue daily.   . Eliquis DVT/PE Starter Pack (ELIQUIS STARTER PACK) 5 MG TABS Take as directed on  package: start with two-5mg  tablets twice daily for 7 days. On day 8, switch to one-5mg  tablet twice daily.  . famotidine (PEPCID) 40 MG tablet Take 40 mg by mouth at bedtime.   . fentaNYL (DURAGESIC) 25 MCG/HR Place 1 patch onto the skin every 3 (three) days.  . fluticasone (FLONASE) 50 MCG/ACT nasal spray USE 2 SPRAYS INTO BOTH     NOSTRILS NIGHTLY  . magnesium hydroxide (MILK OF MAGNESIA) 400 MG/5ML suspension Take 30 mLs by mouth daily as needed for mild constipation.  . montelukast (SINGULAIR) 10 MG tablet TAKE 1 TABLET DAILY  . Oxycodone HCl 10 MG TABS Take 1 tablet (10 mg total) by mouth every 4 (four) hours as needed.  . prochlorperazine (COMPAZINE) 10 MG tablet Take 1 tablet (10 mg total) by mouth every 6 (six) hours as needed (Nausea or vomiting).    Allergies:  Allergies  Allergen Reactions  . Demerol [Meperidine] Swelling  . Sulfa Antibiotics Swelling    No family history on file.  Social History:  reports that she has never smoked. She has never used smokeless tobacco. She reports current alcohol use. She reports that she does not use drugs.  ROS: A complete review of systems was performed.  All systems are negative except for pertinent findings as noted.  Physical Exam:  Vital signs in last 24 hours: Temp:  [97.6 F (36.4 C)] 97.6 F (36.4 C) (06/09 0120) Pulse Rate:  [74-112] 98 (06/09 0530) Resp:  [20] 20 (06/09 0342) BP: (119-184)/(50-80) 184/80 (06/09 0530) SpO2:  [94 %-98 %] 98 % (06/09 0530) Weight:  [62.6 kg] 62.6 kg (06/09 0121) Constitutional:  Alert and oriented, No acute distress.  Very hard of hearing.  Recognizes me and answers most questions appropriately but given severe hearing impairment, history is limited. HEENT: [place] AT, moist mucus membranes.  Trachea midline, no masses Respiratory: Normal respiratory effort, no increased work of breathing. GI: Abdomen is soft, nontender, nondistended, no abdominal masses GU: Wearing diaper with blood tinged  urine staining/ feces also present Skin: No rashes, bruises or suspicious lesions Neurologic: Grossly intact, no focal deficits, moving all 4 extremities Psychiatric: Normal mood and affect   Laboratory Data:  Recent Labs    08/20/18 0127 08/20/18 0517  WBC 9.5  --   HGB 8.8* 8.1*  HCT 26.8* 24.7*   Recent Labs    08/20/18 0127  NA 135  K 3.8  CL 101  CO2 28  GLUCOSE 124*  BUN 25*  CREATININE 1.44*  CALCIUM 8.2*   Recent Labs    08/20/18 0341  INR 2.1*   No results for input(s): LABURIN in the last 72 hours. Results for orders placed or performed during the hospital encounter of 08/20/18  SARS Coronavirus 2 (CEPHEID - Performed in [place] hospital lab), Hosp Order     Status: None   Collection Time: 08/20/18  6:28 AM  Result Value Ref Range Status   SARS Coronavirus 2 NEGATIVE NEGATIVE Final    Comment: (NOTE) If result is NEGATIVE SARS-CoV-2 target nucleic acids are NOT DETECTED. The SARS-CoV-2 RNA is generally detectable in upper and lower  respiratory specimens during the acute phase of infection. The lowest  concentration of SARS-CoV-2 viral copies this assay can detect is 250  copies / mL. A negative result does not preclude SARS-CoV-2 infection  and should not be used as the sole basis for treatment or other  patient management decisions.  A negative result may occur with  improper specimen collection / handling, submission of specimen other  than nasopharyngeal swab, presence of viral mutation(s) within the  areas targeted by this assay, and inadequate number of viral copies  (<250 copies / mL). A negative result must be combined with clinical  observations, patient history, and epidemiological information. If result is POSITIVE SARS-CoV-2 target nucleic acids are DETECTED. The SARS-CoV-2 RNA is generally detectable in upper and lower  respiratory specimens dur ing the acute phase of infection.  Positive  results are indicative of active infection  with SARS-CoV-2.  Clinical  correlation with patient history and other diagnostic information is  necessary to determine patient infection status.  Positive results do  not rule out bacterial infection or co-infection with other viruses. If result is PRESUMPTIVE POSTIVE SARS-CoV-2 nucleic acids MAY BE PRESENT.   A presumptive positive result was obtained on the submitted specimen  and confirmed on repeat testing.  While 2019 novel coronavirus  (SARS-CoV-2) nucleic acids may be present in the submitted sample  additional confirmatory testing may be necessary for epidemiological  and / or clinical management purposes  to differentiate between  SARS-CoV-2 and other Sarbecovirus currently known to infect humans.  If clinically indicated additional testing with an alternate test  methodology [phone]) is advised. The SARS-CoV-2 RNA is generally  detectable in upper and lower respiratory sp ecimens during the acute  phase of infection. The expected result is Negative. Fact Sheet for Patients:  StrictlyIdeas.no Fact Sheet for Healthcare Providers: BankingDealers.co.za This test is not yet approved or cleared by the [place] FDA and has been authorized for detection and/or diagnosis of SARS-CoV-2 by FDA under an Emergency Use Authorization (EUA).  This EUA will remain in effect (meaning this test can be used) for the duration of the COVID-19 declaration under Section 564(b)(1) of the Act, 21 U.S.C. section 360bbb-3(b)(1), unless the authorization is terminated or revoked sooner. Performed at [facility], [address] [place],  [address]    Component     Latest Ref Rng & Units 08/20/2018  Color, Urine     YELLOW RED (A)  Appearance     CLEAR CLOUDY (A)  Specific Gravity, Urine     1.005 - 1.030 TEST NOT REPORTED DUE TO COLOR INTERFERENCE OF URINE PIGMENT  pH     5.0 - 8.0 TEST NOT REPORTED DUE TO COLOR INTERFERENCE  OF URINE PIGMENT  Glucose, UA     NEGATIVE mg/dL TEST NOT REPORTED DUE TO COLOR INTERFERENCE OF URINE PIGMENT (A)  Hgb urine dipstick     NEGATIVE TEST NOT REPORTED DUE TO COLOR INTERFERENCE OF URINE PIGMENT (A)  Bilirubin Urine     NEGATIVE TEST NOT REPORTED DUE TO COLOR INTERFERENCE OF URINE PIGMENT (A)  Ketones, ur     NEGATIVE mg/dL TEST NOT REPORTED DUE TO COLOR INTERFERENCE OF URINE PIGMENT (A)  Protein     NEGATIVE mg/dL TEST NOT REPORTED  DUE TO COLOR INTERFERENCE OF URINE PIGMENT (A)  Nitrite     NEGATIVE TEST NOT REPORTED DUE TO COLOR INTERFERENCE OF URINE PIGMENT (A)  Leukocytes,Ua     NEGATIVE TEST NOT REPORTED DUE TO COLOR INTERFERENCE OF URINE PIGMENT (A)  RBC / HPF     0 - 5 RBC/hpf >50 (H)  WBC, UA     0 - 5 WBC/hpf >50 (H)  Bacteria, UA     NONE SEEN NONE SEEN  Squamous Epithelial / LPF     0 - 5 NONE SEEN    Radiologic Imaging: CT abdomen pelvis without contrast on 08/12/2026 personally reviewed, agree with radiologic interpretation  Impression/ Plan:  85 year old female with metastatic right upper tract urothelial carcinoma involving complete obstruction of her right ureter presenting with gross hematuria/constipation.  1.  Gross hematuria-degree of gross hematuria is very difficult to assess.  She does not currently have a Foley catheter.  I have asked the nurse to bladder scanner to see if she is emptying her bladder.  If she is able to empty, would not recommend any further intervention such as CBI or Foley at this time.  This is likely related to her underlying disease process and progression of her cancer exacerbated by Eliquis.  Hold Eliquis as possible.  Transfuse as needed.  Role for surgical intervention.  Specialist drop in hemoglobin not secondary to acute severe bleeding, 8.1 is stable from her hemoglobin performed as outpatient 10 days ago.  Repeat hemoglobin in a.m.  Follow-up urine culture and treat as needed although UTIs less likely.  2.  Metastatic  urothelial carcinoma/declining functional status-currently being followed by palliative care.  Agree with palliative care consult while inpatient and consideration of hospice given her precipitous decline.  No role for further chemotherapy or surgical intervention.  3.  Lower abdominal pain-suspect likely related to severe constipation requiring fecal disimpaction in the ER.  Bladder scan to rule out retention although not suspected based on exam.  4. CKD stage III- Cr at baseline-. Complete right ureteral obstruction secondary to underlying tumor although this kidney is severely atrophic.  Unable to place ureteral stent, role of percutaneous nephrostomy tube limited due to poor right renal parenchyma.  08/20/2018, 8:47 AM  Hollice Espy,  MD

## 2018-08-20 NOTE — H&P (Signed)
[place] [facility] [place] NAME: Kristi Thompson    MR#:  [id number]  DATE OF BIRTH:  1923-11-14  DATE OF ADMISSION:  08/20/2018  PRIMARY CARE PHYSICIAN: [name], MD   REQUESTING/REFERRING PHYSICIAN: Rudene Re, MD  CHIEF COMPLAINT:   Chief Complaint  Patient presents with  . Hematuria    HISTORY OF PRESENT ILLNESS:  Kristi Thompson  is a 85 y.o. Caucasian female with a known history of multiple medical problems that will be mentioned below including urothelial cancer, presented to the emergency room with onset of hematuria this morning with associated dysuria.  While in the ER her hemoglobin dropped from 8.8-8.1 and hematocrit from 26.8-24.7.  The BUN and creatinine were 25/1.44 and troponin was 5.5 with albumin of 2.7.  INR was 2.1.  Urinalysis showed more than 50 WBCs and more than 50 RBCs.  When she came to the ER her blood pressure was 129/59 and later 184/80 with otherwise normal vital signs.  She was given 5 mg of p.o. Keflex as well as 2 mg of IV morphine sulfate and 4 mg of IV Zofran.  She will be admitted to a medical monitored bed for further evaluation and management. PAST MEDICAL HISTORY:   Past Medical History:  Diagnosis Date  . Asthma   . Cancer ([place]) 1978   colon ca  . COPD (chronic obstructive pulmonary disease) ([place])   . GERD (gastroesophageal reflux disease)   . History of hiatal hernia   . Urothelial cancer ([place]) 2019   Urothelial carcinoma    PAST SURGICAL HISTORY:   Past Surgical History:  Procedure Laterality Date  . ABDOMINAL HYSTERECTOMY    . APPENDECTOMY    . bowel obstruction    . CATARACT EXTRACTION W/ INTRAOCULAR LENS  IMPLANT, BILATERAL    . CHOLECYSTECTOMY    . COLON SURGERY     colon cancer  . COLONOSCOPY WITH PROPOFOL N/A 01/18/2018   Procedure: COLONOSCOPY WITH PROPOFOL;  Surgeon: Jonathon Bellows, MD;  [facility] ENDOSCOPY;  Service: Gastroenterology;  Laterality: N/A;  . CYSTOSCOPY  W/ URETERAL STENT PLACEMENT Right 02/06/2018   Procedure: CYSTOSCOPY WITH RETROGRADE PYELOGRAM;  Surgeon: Hollice Espy, MD;  Location: [facility] ORS;  Service: Urology;  Laterality: Right;  . CYSTOSCOPY WITH URETEROSCOPY Right 02/06/2018   Procedure: CYSTOSCOPY WITH URETEROSCOPY;  Surgeon: Hollice Espy, MD;  Location: [facility] ORS;  Service: Urology;  Laterality: Right;  . PORTA CATH INSERTION N/A 02/11/2018   Procedure: PORTA CATH INSERTION;  Surgeon: Algernon Huxley, MD;  Location: [place] CV LAB;  Service: Cardiovascular;  Laterality: N/A;  . THYROID SURGERY     nodule removed  . THYROIDECTOMY    . TONSILLECTOMY    . URETERAL BIOPSY Right 02/06/2018   Procedure: URETERAL BIOPSY;  Surgeon: Hollice Espy, MD;  Location: [facility] ORS;  Service: Urology;  Laterality: Right;    SOCIAL HISTORY:   Social History   Tobacco Use  . Smoking status: Never Smoker  . Smokeless tobacco: Never Used  Substance Use Topics  . Alcohol use: Yes    Comment: rarely    FAMILY HISTORY:  Negative for pertinent familial diseases.  DRUG ALLERGIES:   Allergies  Allergen Reactions  . Demerol [Meperidine] Swelling  . Sulfa Antibiotics Swelling    REVIEW OF SYSTEMS:   ROS As per history of present illness. All pertinent systems were reviewed above. Constitutional,  HEENT, cardiovascular, respiratory, GI, GU, musculoskeletal, neuro, psychiatric, endocrine,  integumentary and hematologic systems  were reviewed and are otherwise  negative/unremarkable except for positive findings mentioned above in the HPI.   MEDICATIONS AT HOME:   Prior to Admission medications   Medication Sig Start Date End Date Taking? Authorizing Provider  acetaminophen (TYLENOL) 325 MG tablet Take 325 mg by mouth every 6 (six) hours as needed.    [provider]  albuterol (PROVENTIL HFA;VENTOLIN HFA) 108 (90 Base) MCG/ACT inhaler Inhale into the lungs.    [provider]  apixaban (ELIQUIS) 5 MG TABS tablet  Take 1 tablet (5 mg total) by mouth 2 (two) times daily. Initiate once starter pack is completed. 08/01/18   Lloyd Huger, MD  budesonide (PULMICORT FLEXHALER) 180 MCG/ACT inhaler Inhale into the lungs. 05/23/17 08/01/18  [provider]  Cyanocobalamin 2500 MCG SUBL Place under the tongue.    [provider]  Eliquis DVT/PE Starter Pack (ELIQUIS STARTER PACK) 5 MG TABS Take as directed on package: start with two-5mg  tablets twice daily for 7 days. On day 8, switch to one-5mg  tablet twice daily. 07/25/18   Lloyd Huger, MD  famotidine (PEPCID) 40 MG tablet Take by mouth. 12/19/17 12/19/18  [provider]  fentaNYL (DURAGESIC) 25 MCG/HR Place 1 patch onto the skin every 3 (three) days. 08/15/18   Lloyd Huger, MD  fluticasone (FLONASE) 50 MCG/ACT nasal spray USE 2 SPRAYS INTO BOTH     NOSTRILS NIGHTLY 06/13/17   [provider]  HYDROcodone-acetaminophen (NORCO/VICODIN) 5-325 MG tablet TK 1 T PO Q 6 H PRN 12/27/17   [provider]  hydrocortisone (ANUSOL-HC) 25 MG suppository Place 1 suppository (25 mg total) rectally 2 (two) times daily. 07/18/18   Lloyd Huger, MD  mirabegron ER (MYRBETRIQ) 25 MG TB24 tablet Take 1 tablet (25 mg total) by mouth daily. 02/06/18   Hollice Espy, MD  montelukast (SINGULAIR) 10 MG tablet TAKE 1 TABLET DAILY 07/16/16   [provider]  Multiple Vitamin (MULTI-VITAMINS) TABS Take by mouth.    [provider]  Multiple Vitamins-Minerals (MULTIVITAMIN ADULT PO) Take by mouth.    [provider]  ondansetron (ZOFRAN) 8 MG tablet Take 1 tablet (8 mg total) by mouth 2 (two) times daily as needed (Nausea or vomiting). 03/08/18   Lloyd Huger, MD  Oxycodone HCl 10 MG TABS Take 1 tablet (10 mg total) by mouth every 4 (four) hours as needed. 08/19/18   Lloyd Huger, MD  predniSONE (STERAPRED UNI-PAK 21 TAB) 10 MG (21) TBPK tablet Take as directed. 04/24/18   Jacquelin Hawking, NP   prochlorperazine (COMPAZINE) 10 MG tablet Take 1 tablet (10 mg total) by mouth every 6 (six) hours as needed (Nausea or vomiting). 03/08/18   Lloyd Huger, MD  ranitidine (ZANTAC) 300 MG tablet Take 1 tablet by mouth 1 day or 1 dose. 03/10/18   [provider]  sulfamethoxazole-trimethoprim (BACTRIM DS) 800-160 MG tablet Take 1 tablet by mouth 2 (two) times daily. 07/25/18   Lloyd Huger, MD  tamsulosin (FLOMAX) 0.4 MG CAPS capsule Take by mouth.    [provider]      VITAL SIGNS:  Blood pressure (!) 184/80, pulse 98, temperature 97.6 F (36.4 C), temperature source Oral, resp. rate 20, height 5\' 1"  (1.549 m), weight 62.6 kg, SpO2 98 %.  PHYSICAL EXAMINATION:  Physical Exam  GENERAL:  85 y.o.-year-old Caucasian patient lying in the bed with no acute distress.  EYES: Pupils equal, round, reactive to light and accommodation. No scleral icterus.  Positive pallor extraocular muscles intact.  HEENT: Head atraumatic, normocephalic. Oropharynx and nasopharynx clear.  NECK:  Supple, no jugular venous distention. No thyroid enlargement, no tenderness.  LUNGS: Normal breath sounds bilaterally, no wheezing, rales,rhonchi or crepitation. No use of accessory muscles of respiration.  CARDIOVASCULAR: Regular rate and rhythm, S1, S2 normal. No murmurs, rubs, or gallops.  ABDOMEN: Soft, with suprapubic tenderness without rebound tenderness guarding or rigidity.  Bowel sounds present. No organomegaly or mass.  EXTREMITIES: 1+ bilateral lower extremity pitting edema, with no cyanosis, or clubbing.  NEUROLOGIC: Cranial nerves II through XII are intact. Muscle strength 5/5 in all extremities. Sensation intact. Gait not checked.  PSYCHIATRIC: The patient is alert and oriented x 3.  Normal affect and good eye contact. SKIN: Positive pallor.  No obvious rash, lesion, or ulcer.   LABORATORY PANEL:   CBC Recent Labs  Lab 08/20/18 0127 08/20/18 0517  WBC 9.5  --   HGB 8.8*  8.1*  HCT 26.8* 24.7*  PLT 176  --    ------------------------------------------------------------------------------------------------------------------  Chemistries  Recent Labs  Lab 08/20/18 0127  NA 135  K 3.8  CL 101  CO2 28  GLUCOSE 124*  BUN 25*  CREATININE 1.44*  CALCIUM 8.2*  AST 23  ALT 10  ALKPHOS 107  BILITOT 1.0   ------------------------------------------------------------------------------------------------------------------  Cardiac Enzymes No results for input(s): TROPONINI in the last 168 hours. ------------------------------------------------------------------------------------------------------------------  RADIOLOGY:  No results found.    IMPRESSION AND PLAN:   1.  Hematuria with subsequent acute blood loss anemia in the setting of urothelial carcinoma.  Patient will be admitted to a medical monitored bed.  Will follow serial hemoglobin and hematocrits.  A urology consultation will be obtained.  I notified Dr. Erlene Quan about the patient.  The patient's Eliquis will be held off.  2.  Suspected UTI.  We will place her on IV Rocephin for now and follow urine culture and sensitivity.  3.  Asthma.  We will continue her Pulmicort and albuterol.  She has no current exacerbation.  4.  GERD.  H2 blocker therapy will be continued.  5.  DVT prophylaxis.  SCDs.  Her Eliquis will hold off given her hematuria.  All the records are reviewed and case discussed with ED provider. The plan of care was discussed in details with the patient (and family). I answered all questions. The patient agreed to proceed with the above mentioned plan. Further management will depend upon hospital course.   CODE STATUS: Full code  TOTAL TIME TAKING CARE OF THIS PATIENT: 55 minutes.    Christel Mormon M.D on 08/20/2018 at 6:32 AM  Pager - [phone]  After 6pm go to [email or web address] - Proofreader  [facility]  Office  [phone]  CC:  Primary care physician; [name], MD   Note: This dictation was prepared with Dragon dictation along with smaller phrase technology. Any transcriptional errors that result from this process are unintentional.

## 2018-08-20 NOTE — Progress Notes (Signed)
Please note, patient is currently followed by outpatient Palliative at home. Most recent visit on 6/8. CSW Annamaria Boots made aware. Flo Shanks BS, RN, [facility] [phone]

## 2018-08-20 NOTE — ED Notes (Signed)
Pt has portacath in place but gives permission for this nurse to perform venipuncture for blood draw

## 2018-08-20 NOTE — ED Notes (Signed)
Pt daughter Laveah Gloster [phone] - updated on pt condition and move to floor

## 2018-08-20 NOTE — Progress Notes (Signed)
Patient refused her evening medications tonight. Did educate patient on medications and what they were.

## 2018-08-20 NOTE — ED Notes (Signed)
Pt noted sitting in lobby in w/c, crying; charge nurse called for bed for further evaluation

## 2018-08-20 NOTE — ED Notes (Signed)
Message sent to Dr Posey Pronto at request of floor to see if pt could have a diet or be NPO accept for meds

## 2018-08-20 NOTE — Progress Notes (Signed)
[name], female   DOB: 1923/12/06, 85 y.o.   MRN: [id number] patient seen chart reviewed. Patient still has minimal hematuria. Dr. Cherrie Gauze input appreciated. Called and spoke with patient's daughter Kristi Thompson on the phone and updated.

## 2018-08-21 ENCOUNTER — Inpatient Hospital Stay: Payer: Medicare Other

## 2018-08-21 DIAGNOSIS — Z515 Encounter for palliative care: Secondary | ICD-10-CM

## 2018-08-21 DIAGNOSIS — D649 Anemia, unspecified: Secondary | ICD-10-CM

## 2018-08-21 DIAGNOSIS — Z90710 Acquired absence of both cervix and uterus: Secondary | ICD-10-CM

## 2018-08-21 DIAGNOSIS — Z9089 Acquired absence of other organs: Secondary | ICD-10-CM

## 2018-08-21 DIAGNOSIS — D62 Acute posthemorrhagic anemia: Secondary | ICD-10-CM

## 2018-08-21 DIAGNOSIS — Z85038 Personal history of other malignant neoplasm of large intestine: Secondary | ICD-10-CM

## 2018-08-21 DIAGNOSIS — N189 Chronic kidney disease, unspecified: Secondary | ICD-10-CM

## 2018-08-21 DIAGNOSIS — Z885 Allergy status to narcotic agent status: Secondary | ICD-10-CM

## 2018-08-21 DIAGNOSIS — Z66 Do not resuscitate: Secondary | ICD-10-CM

## 2018-08-21 DIAGNOSIS — J449 Chronic obstructive pulmonary disease, unspecified: Secondary | ICD-10-CM

## 2018-08-21 DIAGNOSIS — R319 Hematuria, unspecified: Secondary | ICD-10-CM

## 2018-08-21 DIAGNOSIS — Z9221 Personal history of antineoplastic chemotherapy: Secondary | ICD-10-CM

## 2018-08-21 DIAGNOSIS — R413 Other amnesia: Secondary | ICD-10-CM

## 2018-08-21 DIAGNOSIS — Z960 Presence of urogenital implants: Secondary | ICD-10-CM

## 2018-08-21 DIAGNOSIS — C679 Malignant neoplasm of bladder, unspecified: Secondary | ICD-10-CM

## 2018-08-21 DIAGNOSIS — Z882 Allergy status to sulfonamides status: Secondary | ICD-10-CM

## 2018-08-21 DIAGNOSIS — Z9049 Acquired absence of other specified parts of digestive tract: Secondary | ICD-10-CM

## 2018-08-21 LAB — URINE CULTURE: Culture: NO GROWTH

## 2018-08-21 LAB — BASIC METABOLIC PANEL
Anion gap: 7 (ref 5–15)
BUN: 35 mg/dL — ABNORMAL HIGH (ref 8–23)
CO2: 26 mmol/L (ref 22–32)
Calcium: 7.9 mg/dL — ABNORMAL LOW (ref 8.9–10.3)
Chloride: 100 mmol/L (ref 98–111)
Creatinine, Ser: 2.19 mg/dL — ABNORMAL HIGH (ref 0.44–1.00)
GFR calc Af Amer: 22 mL/min — ABNORMAL LOW (ref >60)
GFR calc non Af Amer: 19 mL/min — ABNORMAL LOW (ref >60)
Glucose, Bld: 99 mg/dL (ref 70–99)
Potassium: 4.2 mmol/L (ref 3.5–5.1)
Sodium: 133 mmol/L — ABNORMAL LOW (ref 135–145)

## 2018-08-21 LAB — HEMOGLOBIN
Hemoglobin: 8.4 g/dL — ABNORMAL LOW (ref 12.0–15.0)
Hemoglobin: 7.7 g/dL — ABNORMAL LOW (ref 12.0–15.0)

## 2018-08-21 LAB — CBC
HCT: 18.5 % — ABNORMAL LOW (ref 36.0–46.0)
Hemoglobin: 6 g/dL — ABNORMAL LOW (ref 12.0–15.0)
MCH: 34.7 pg — ABNORMAL HIGH (ref 26.0–34.0)
MCHC: 32.4 g/dL (ref 30.0–36.0)
MCV: 106.9 fL — ABNORMAL HIGH (ref 80.0–100.0)
Platelets: 140 10*3/uL — ABNORMAL LOW (ref 150–400)
RBC: 1.73 MIL/uL — ABNORMAL LOW (ref 3.87–5.11)
RDW: 13.3 % (ref 11.5–15.5)
WBC: 7.5 10*3/uL (ref 4.0–10.5)
nRBC: 0 % (ref 0.0–0.2)

## 2018-08-21 LAB — ABO/RH: ABO/RH(D): AB POS

## 2018-08-21 MED ORDER — FLEET ENEMA 7-19 GM/118ML RE ENEM
1.0000 | ENEMA | Freq: Once | RECTAL | Status: AC
  Administered 2018-08-21: 1 via RECTAL

## 2018-08-21 MED ORDER — SODIUM CHLORIDE 0.9% IV SOLUTION
Freq: Once | INTRAVENOUS | Status: AC
  Administered 2018-08-21: 11:00:00 via INTRAVENOUS

## 2018-08-21 MED ORDER — SODIUM CHLORIDE 0.9 % IV SOLN
Freq: Once | INTRAVENOUS | Status: AC
  Administered 2018-08-21: 08:00:00 via INTRAVENOUS

## 2018-08-21 NOTE — Progress Notes (Signed)
Called MD on call d/t patients Hgb 6.0 and Hematocrit 18.5. MD is ordering type and cross as well as 2 units packed RBCs.

## 2018-08-21 NOTE — Progress Notes (Addendum)
[facility] [place] NAME: Kristi Thompson    MR#:  [id number]  DATE OF BIRTH:  1923-03-16  SUBJECTIVE:    REVIEW OF SYSTEMS:   Review of Systems  Constitutional: Negative for chills, fever and weight loss.  HENT: Negative for ear discharge, ear pain and nosebleeds.   Eyes: Negative for blurred vision, pain and discharge.  Respiratory: Negative for sputum production, shortness of breath, wheezing and stridor.   Cardiovascular: Negative for chest pain, palpitations, orthopnea and PND.  Gastrointestinal: Positive for constipation. Negative for abdominal pain, diarrhea, nausea and vomiting.  Genitourinary: Positive for hematuria. Negative for frequency and urgency.  Musculoskeletal: Negative for back pain and joint pain.  Neurological: Positive for weakness. Negative for sensory change, speech change and focal weakness.  Psychiatric/Behavioral: Negative for depression and hallucinations. The patient is not nervous/anxious.    Tolerating Diet:yes Tolerating WL:[id number]  DRUG ALLERGIES:   Allergies  Allergen Reactions  . Demerol [Meperidine] Swelling  . Sulfa Antibiotics Swelling    VITALS:  Blood pressure (!) 123/59, pulse 74, temperature 97.7 F (36.5 C), temperature source Oral, resp. rate 16, height 5\' 1"  (1.549 m), weight 62.6 kg, SpO2 98 %.  PHYSICAL EXAMINATION:   Physical Exam  GENERAL:  85 y.o.-year-old patient lying in the bed with no acute distress.  EYES: Pupils equal, round, reactive to light and accommodation. No scleral icterus. Pallor+ HEENT: Head atraumatic, normocephalic. Oropharynx and nasopharynx clear.  NECK:  Supple, no jugular venous distention. No thyroid enlargement, no tenderness.  LUNGS: Normal breath sounds bilaterally, no wheezing, rales, rhonchi. No use of accessory muscles of respiration.  CARDIOVASCULAR: S1, S2 normal. No murmurs, rubs, or gallops.  ABDOMEN: Soft, nontender, nondistended. Bowel  sounds present. No organomegaly or mass.  EXTREMITIES: No cyanosis, clubbing or edema b/l.    NEUROLOGIC: Cranial nerves II through XII are intact. No focal Motor or sensory deficits b/l.   PSYCHIATRIC:  patient is alert and oriented x 2.  SKIN: No obvious rash, lesion, or ulcer.   LABORATORY PANEL:  CBC Recent Labs  Lab 08/21/18 0538  WBC 7.5  HGB 6.0*  HCT 18.5*  PLT 140*    Chemistries  Recent Labs  Lab 08/20/18 0127 08/21/18 0538  NA 135 133*  K 3.8 4.2  CL 101 100  CO2 28 26  GLUCOSE 124* 99  BUN 25* 35*  CREATININE 1.44* 2.19*  CALCIUM 8.2* 7.9*  AST 23  --   ALT 10  --   ALKPHOS 107  --   BILITOT 1.0  --    Cardiac Enzymes No results for input(s): TROPONINI in the last 168 hours. RADIOLOGY:  No results found. ASSESSMENT AND PLAN:  Kristi Thompson  is a 85 y.o. Caucasian female with a known history of multiple medical problems t including urothelial cancer, presented to the emergency room with onset of hematuria this morning with associated dysuria.  While in the ER her hemoglobin dropped from 8.8-8.1  1.  Hematuria with subsequent acute blood loss anemia in the setting of urothelial carcinoma -  Will follow serial hemoglobin and hematocrits.  - urology consultation with Dr Erlene Quan noted -   patient's Eliquis will be held off. -8.8--8.1--6.0--1 unit BT -risks and complications of PT d/w pt. She says she has has many BT's in the past with her colon cancer -Dr Grayland Ormond informed about the pt  2. H/o DVT -hold eliquis  3.  Asthma.  continue her Pulmicort and albuterol.  She  has no current exacerbation.  4.  GERD.  H2 blocker therapy will be continued.  5.  DVT prophylaxis.  SCDs.  -her Eliquis will hold off given her hematuria.  6. Acute on CKD-III -US renal ordered to r/o  Clot retention/obstruction -creat 2.12 -baseline creat 1.5  D/w [name] on the phone--she does understand poor prognosis about her mom   CODE STATUS: full DVT  Prophylaxis: SCD  TOTAL TIME TAKING CARE OF THIS PATIENT: *30* minutes.  >50% time spent on counselling and coordination of care  POSSIBLE D/C IN *few* DAYS, DEPENDING ON CLINICAL CONDITION.  Note: This dictation was prepared with Dragon dictation along with smaller phrase technology. Any transcriptional errors that result from this process are unintentional.  Fritzi Mandes M.D on 08/21/2018 at 8:12 AM  Between 7am to 6pm - Pager - [phone]  After 6pm go to [email or web address] - password [email or web address] [facility]  Office  [phone]  [name]; [name], MDPatient ID: Kristi Thompson, female   DOB: 08/13/1923, 85 y.o.   MRN: [id number]

## 2018-08-21 NOTE — Consult Note (Signed)
[place]  Telephone:([phone] [phone]   Name: Kristi Thompson Date: 08/21/2018 MRN: [id number]  DOB: 05-20-1923  Patient Care Team: [name], MD as PCP - General (Internal Medicine) Clent Jacks, RN as Registered Nurse Estevan Oaks, NP as Nurse Practitioner (Hospice and Palliative Medicine) Lloyd Huger, MD as Consulting Physician (Oncology) Benjy Kana, Kirt Boys, NP as Nurse Practitioner (Hospice and Palliative Medicine)    REASON FOR CONSULTATION: Palliative Care consult requested for this 85 y.o. female with multiple medical problems high-grade urothelial carcinoma previously treated on atezolizumab and switched to single agent gemcitabine due to lack of PDL1. Patient was admitted to the hospital on 08/20/18 with hematuria and weakness. Patient is being followed by urology. She was referred to palliative care to help address goals.  SOCIAL HISTORY:    Patient is widowed.  She lives at [facility] in a senior apartment.  She has a daughter who lives in [place] and a son and daughter who lives in [place].  She is originally from [place]. 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  ADVANCE DIRECTIVES:  Patient has healthcare power of attorney and living will on file.  Her living will specifies a desire for natural death.  CODE STATUS: DNR  PAST MEDICAL HISTORY: Past Medical History:  Diagnosis Date   Asthma    Cancer ([place]) 1978   colon ca   COPD (chronic obstructive pulmonary disease) ([place])    GERD (gastroesophageal reflux disease)    History of hiatal hernia    Urothelial cancer ([place]) 2019   Urothelial carcinoma    PAST SURGICAL HISTORY:  Past Surgical History:  Procedure Laterality Date   ABDOMINAL HYSTERECTOMY     APPENDECTOMY     bowel obstruction     CATARACT EXTRACTION W/ INTRAOCULAR LENS  IMPLANT, BILATERAL     CHOLECYSTECTOMY     COLON SURGERY     colon cancer    COLONOSCOPY WITH PROPOFOL N/A 01/18/2018   Procedure: COLONOSCOPY WITH PROPOFOL;  Surgeon: Jonathon Bellows, MD;  Location: [facility] ENDOSCOPY;  Service: Gastroenterology;  Laterality: N/A;   CYSTOSCOPY W/ URETERAL STENT PLACEMENT Right 02/06/2018   Procedure: CYSTOSCOPY WITH RETROGRADE PYELOGRAM;  Surgeon: Hollice Espy, MD;  Location: [facility] ORS;  Service: Urology;  Laterality: Right;   CYSTOSCOPY WITH URETEROSCOPY Right 02/06/2018   Procedure: CYSTOSCOPY WITH URETEROSCOPY;  Surgeon: Hollice Espy, MD;  Location: [facility] ORS;  Service: Urology;  Laterality: Right;   PORTA CATH INSERTION N/A 02/11/2018   Procedure: PORTA CATH INSERTION;  Surgeon: Algernon Huxley, MD;  Location: [place] CV LAB;  Service: Cardiovascular;  Laterality: N/A;   THYROID SURGERY     nodule removed   THYROIDECTOMY     TONSILLECTOMY     URETERAL BIOPSY Right 02/06/2018   Procedure: URETERAL BIOPSY;  Surgeon: Hollice Espy, MD;  Location: [facility] ORS;  Service: Urology;  Laterality: Right;    HEMATOLOGY/ONCOLOGY HISTORY:    Urothelial carcinoma ([place])   02/12/2018 Initial Diagnosis    Urothelial carcinoma ([place])    02/14/2018 - 03/27/2018 Chemotherapy    The patient had atezolizumab (TECENTRIQ) 1,200 mg in sodium chloride 0.9 % 250 mL chemo infusion, 1,200 mg, Intravenous, Once, 2 of 6 cycles Administration: 1,200 mg (02/14/2018), 1,200 mg (03/07/2018)  for chemotherapy treatment.     03/28/2018 -  Chemotherapy    The patient had gemcitabine (GEMZAR) 1,300 mg in sodium chloride 0.9 % 250 mL chemo infusion, 1,292 mg (100 % of original dose  800 mg/m2), Intravenous,  Once, 5 of 8 cycles Dose modification: 800 mg/m2 (original dose 800 mg/m2, Cycle 1, Reason: Patient Age) Administration: 1,300 mg (03/28/2018), 1,300 mg (04/04/2018), 1,300 mg (04/18/2018), 1,300 mg (05/02/2018), 1,300 mg (05/16/2018), 1,300 mg (05/30/2018), 1,300 mg (06/13/2018), 1,300 mg (06/27/2018), 1,300 mg (07/11/2018)  for chemotherapy treatment.      ALLERGIES:   is allergic to demerol [meperidine] and sulfa antibiotics.  MEDICATIONS:  Current Facility-Administered Medications  Medication Dose Route Frequency Provider Last Rate Last Dose   acetaminophen (TYLENOL) tablet 650 mg  650 mg Oral Q6H PRN Mansy, Jan A, MD       Or   acetaminophen (TYLENOL) suppository 650 mg  650 mg Rectal Q6H PRN Mansy, Jan A, MD       albuterol (PROVENTIL) (2.5 MG/3ML) 0.083% nebulizer solution 3 mL  3 mL Inhalation Q4H PRN Mansy, Jan A, MD       budesonide (PULMICORT) nebulizer solution 0.25 mg  2 mL Inhalation BID Mansy, Jan A, MD   0.25 mg at 08/21/18 0752   docusate sodium (COLACE) capsule 100 mg  100 mg Oral BID Fritzi Mandes, MD   100 mg at 08/21/18 0856   famotidine (PEPCID) tablet 10 mg  10 mg Oral BID Mansy, Jan A, MD   10 mg at 08/21/18 0856   [START ON 08/22/2018] fentaNYL (DURAGESIC) 25 MCG/HR 1 patch  1 patch Transdermal Q72H Mansy, Jan A, MD       fluticasone (FLONASE) 50 MCG/ACT nasal spray 2 spray  2 spray Each Nare QHS Mansy, Jan A, MD   2 spray at 08/20/18 2236   HYDROcodone-acetaminophen (NORCO/VICODIN) 5-325 MG per tablet 1 tablet  1 tablet Oral Q6H PRN Mansy, Jan A, MD       hydrocortisone (ANUSOL-HC) suppository 25 mg  25 mg Rectal BID Mansy, Jan A, MD   25 mg at 08/21/18 1610   magnesium hydroxide (MILK OF MAGNESIA) suspension 30 mL  30 mL Oral Daily PRN Mansy, Jan A, MD       mirabegron ER [facility]) tablet 25 mg  25 mg Oral Daily Mansy, Jan A, MD   25 mg at 08/21/18 0857   montelukast (SINGULAIR) tablet 10 mg  10 mg Oral QHS Mansy, Jan A, MD       multivitamin with minerals tablet 1 tablet  1 tablet Oral Daily Mansy, Jan A, MD   1 tablet at 08/21/18 0857   ondansetron (ZOFRAN) tablet 4 mg  4 mg Oral Q6H PRN Mansy, Jan A, MD       Or   ondansetron [facility]) injection 4 mg  4 mg Intravenous Q6H PRN Mansy, Jan A, MD       oxyCODONE (Oxy IR/ROXICODONE) immediate release tablet 10 mg  10 mg Oral Q4H PRN Mansy, Jan A, MD   10 mg at 08/21/18  1643   prochlorperazine (COMPAZINE) tablet 10 mg  10 mg Oral Q6H PRN Mansy, Jan A, MD       senna (SENOKOT) tablet 17.2 mg  2 tablet Oral Daily Fritzi Mandes, MD   17.2 mg at 08/21/18 0857   tamsulosin (FLOMAX) capsule 0.4 mg  0.4 mg Oral QPC supper Mansy, Jan A, MD   0.4 mg at 08/21/18 1643   traZODone (DESYREL) tablet 25 mg  25 mg Oral QHS PRN Mansy, Jan A, MD       vitamin B-12 (CYANOCOBALAMIN) tablet 2,500 mcg  2,500 mcg Oral Daily Mansy, Jan A, MD   2,500 mcg at 08/21/18  0630    VITAL SIGNS: BP (!) 155/68 (BP Location: Right Arm)    Pulse 83    Temp 98.1 F (36.7 C) (Oral)    Resp 18    Ht '5\' 1"'$  (1.549 m)    Wt 138 lb 0.1 oz (62.6 kg)    LMP  (LMP Unknown)    SpO2 96%    BMI 26.08 kg/m  Filed Weights   08/20/18 0121  Weight: 138 lb 0.1 oz (62.6 kg)    Estimated body mass index is 26.08 kg/m as calculated from the following:   Height as of this encounter: '5\' 1"'$  (1.549 m).   Weight as of this encounter: 138 lb 0.1 oz (62.6 kg).  LABS: CBC:    Component Value Date/Time   WBC 7.5 08/21/2018 0538   HGB 8.4 (L) 08/21/2018 1645   HGB 12.9 04/30/2013 2116   HCT 18.5 (L) 08/21/2018 0538   HCT 39.1 04/30/2013 2116   PLT 140 (L) 08/21/2018 0538   PLT 229 04/30/2013 2116   MCV 106.9 (H) 08/21/2018 0538   MCV 93 04/30/2013 2116   NEUTROABS 7.5 08/20/2018 0127   LYMPHSABS 1.0 08/20/2018 0127   MONOABS 0.8 08/20/2018 0127   EOSABS 0.1 08/20/2018 0127   BASOSABS 0.1 08/20/2018 0127   Comprehensive Metabolic Panel:    Component Value Date/Time   NA 133 (L) 08/21/2018 0538   NA 140 04/30/2013 2116   K 4.2 08/21/2018 0538   K 3.8 04/30/2013 2116   CL 100 08/21/2018 0538   CL 107 04/30/2013 2116   CO2 26 08/21/2018 0538   CO2 29 04/30/2013 2116   BUN 35 (H) 08/21/2018 0538   BUN 25 (H) 04/30/2013 2116   CREATININE 2.19 (H) 08/21/2018 0538   CREATININE 1.01 04/30/2013 2116   GLUCOSE 99 08/21/2018 0538   GLUCOSE 126 (H) 04/30/2013 2116   CALCIUM 7.9 (L) 08/21/2018 0538    CALCIUM 8.9 04/30/2013 2116   AST 23 08/20/2018 0127   ALT 10 08/20/2018 0127   ALKPHOS 107 08/20/2018 0127   BILITOT 1.0 08/20/2018 0127   PROT 5.5 (L) 08/20/2018 0127   ALBUMIN 2.7 (L) 08/20/2018 0127    RADIOGRAPHIC STUDIES: Ct Abdomen Pelvis Wo Contrast  Result Date: 08/12/2018 CLINICAL DATA:  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 Worsening weakness, fatigue and lower abdominal pain. Restaging. EXAM: CT ABDOMEN AND PELVIS WITHOUT CONTRAST TECHNIQUE: Multidetector CT imaging of the abdomen and pelvis was performed following the standard protocol without IV contrast. COMPARISON:  07/09/2018 PET-CT.  01/18/2018 CT abdomen/pelvis. FINDINGS: Lower chest: Small dependent bilateral pleural effusions with dependent bibasilar atelectasis, mildly increased bilaterally. Coronary atherosclerosis. Hepatobiliary: Normal liver size. No liver mass. Cholecystectomy. No biliary ductal dilatation. Pancreas: Normal, with no mass or duct dilation. Spleen: Normal size. No mass. Adrenals/Urinary Tract: Normal adrenals. No appreciable change in severe chronic right hydroureteronephrosis to the level of the lower lumbar ureter. Stable asymmetric severe atrophy of the right renal parenchyma. No left hydronephrosis. No contour deforming renal masses. No renal stones. Irregular infiltrative solid right pelvic periureteric mass measures 5.8 x 5.1 cm at the level of the upper right pelvis (series 2/image 55), increased from 4.8 x 4.3 cm on 07/09/2018 PET-CT, and measures 5.7 x 5.0 cm at the level of the right ureterovesical junction (series 2/image 63), stable from 5.7 x 5.0 cm. No acute bladder abnormality. Stomach/Bowel: Normal non-distended stomach. Normal caliber small bowel with no small bowel wall thickening. Appendectomy. Oral contrast transits to the colon. Eccentric segmental  wall thickening in the mid transverse colon (series 2/image 27) has not appreciably changed. No new sites of large bowel wall  thickening. Moderate colorectal stool volume. Vascular/Lymphatic: Atherosclerotic nonaneurysmal abdominal aorta. Mildly enlarged 1.0 cm right posterior paracaval node (series 2/image 26), increased from 0.7 cm. No additional new pathologically enlarged abdominopelvic nodes. Reproductive: Hysterectomy.  No new adnexal masses. Other: No pneumoperitoneum or focal fluid collection. Small volume left lower quadrant ascites is mildly increased. Stable mild presacral space edema. Anasarca is increased. Musculoskeletal: No aggressive appearing focal osseous lesions. Moderate lumbar spondylosis. IMPRESSION: 1. Mild right posterior paracaval adenopathy, mildly increased. Irregular infiltrative periureteric neoplasm at the level of the upper right pelvis is increased. Findings suggest mild tumor progression. 2. Eccentric segmental wall thickening in the mid transverse colon appears unchanged. 3. Small dependent bilateral pleural effusions, mildly increased bilaterally. Small volume left lower quadrant ascites is mildly increased. Anasarca is increased. 4. Chronic right hydroureteronephrosis and right renal atrophy is unchanged. 5.  Aortic Atherosclerosis (ICD10-I70.0). Electronically Signed   By: Ilona Sorrel M.D.   On: 08/12/2018 15:48   Mr Brain Wo Contrast  Result Date: 07/30/2018 CLINICAL DATA:  Slurred speech, generalized weakness, and confusion. History of urothelial carcinoma. No IV contrast administered due to diminished renal function. EXAM: MRI HEAD WITHOUT CONTRAST TECHNIQUE: Multiplanar, multiecho pulse sequences of the brain and surrounding structures were obtained without intravenous contrast. COMPARISON:  Head CT 02/22/2015 FINDINGS: Brain: There is no evidence of acute infarct, intracranial hemorrhage, mass, midline shift, or extra-axial fluid collection. Periventricular and deep cerebral white matter T2/FLAIR hyperintensities are nonspecific but compatible with minimal chronic small vessel ischemic  disease. The ventricles and sulci are within normal limits for age. Vascular: Major intracranial vascular flow voids are preserved. Skull and upper cervical spine: No suspicious marrow lesion. Sinuses/Orbits: Bilateral cataract extraction. Mild right maxillary sinus mucosal thickening. Clear mastoid air cells. Other: 1.7 cm T2 hyperintense subcutaneous lesion in the left face, likely a sebaceous cyst. IMPRESSION: 1. No acute intracranial abnormality. 2. Minimal chronic small vessel ischemic disease. Electronically Signed   By: Logan Bores M.D.   On: 07/30/2018 18:03   US Renal  Result Date: 08/21/2018 CLINICAL DATA:  Gross hematuria. EXAM: RENAL / URINARY TRACT ULTRASOUND COMPLETE COMPARISON:  CT scan August 12, 2018 FINDINGS: Right Kidney: Renal measurements: 7.6 x 3.7 x 3.2 cm = volume: 45.9 mL. Severe hydronephrosis remains. Left Kidney: Renal measurements: 10.0 x 5.3 x 4.0 cm = volume: 111 mL. Echogenicity within normal limits. No mass or hydronephrosis visualized. Bladder: The patient has known bladder mass remains measuring 9.5 x 4.7 x 8.1 cm today. IMPRESSION: 1. Persistent severe right hydronephrosis. 2. Known right bladder mass measuring 9.5 x 4.7 x 8.1 cm today. Electronically Signed   By: Dorise Bullion III M.D   On: 08/21/2018 14:55   US Venous Img Lower Unilateral Right  Result Date: 07/25/2018 CLINICAL DATA:  Right lower extremity edema. History of bladder cancer. Evaluate for DVT. EXAM: RIGHT LOWER EXTREMITY VENOUS DOPPLER ULTRASOUND TECHNIQUE: Gray-scale sonography with graded compression, as well as color Doppler and duplex ultrasound were performed to evaluate the lower extremity deep venous systems from the level of the common femoral vein and including the common femoral, femoral, profunda femoral, popliteal and calf veins including the posterior tibial, peroneal and gastrocnemius veins when visible. The superficial great saphenous vein was also interrogated. Spectral Doppler was utilized  to evaluate flow at rest and with distal augmentation maneuvers in the common femoral, femoral and popliteal veins.  COMPARISON:  Right lower extremity venous Doppler ultrasound-04/11/2018; 07/24/2014 FINDINGS: Contralateral Common Femoral Vein: Respiratory phasicity is normal and symmetric with the symptomatic side. No evidence of thrombus. Normal compressibility. Common Femoral Vein: There is hypoechoic nonocclusive DVT within the distal aspect of the right femoral vein (representative images 8 and 11). Saphenofemoral Junction: No evidence of thrombus. Normal compressibility and flow on color Doppler imaging. Profunda Femoral Vein: There is hypoechoic occlusive thrombus within the interrogated portions the right deep femoral vein (image 18). Femoral Vein: There is hypoechoic nonocclusive thrombus within the proximal (image 20) and mid (image 23) aspects of the right femoral vein. The distal aspect the right femoral vein appears patent. Popliteal Vein: No evidence of thrombus. Normal compressibility, respiratory phasicity and response to augmentation. Calf Veins: No evidence of thrombus. Normal compressibility and flow on color Doppler imaging. Superficial Great Saphenous Vein: No evidence of thrombus. Normal compressibility. Venous Reflux:  None. Other Findings: Moderate amount of subcutaneous edema is noted at the level of the right lower leg and calf. IMPRESSION: Examination is positive for occlusive DVT involving the right deep femoral vein. Nonocclusive thrombus is also seen within the right common femoral vein as well as the proximal and mid aspects of the right superficial femoral vein. Electronically Signed   By: Sandi Mariscal M.D.   On: 07/25/2018 16:04    PERFORMANCE STATUS (ECOG) : 3 - Symptomatic, >50% confined to bed  Review of Systems Unless otherwise noted, a complete review of systems is negative.  Physical Exam General: NAD, frail appearing, thin Cardiovascular: regular rate and  rhythm Pulmonary: clear ant fields Abdomen: soft, nontender, + bowel sounds GU: suprapubic tenderness Extremities: no edema, no joint deformities Skin: no rashes Neurological: Weakness but otherwise nonfocal  IMPRESSION: Patient is well-known to me from the clinic.  Patient recognized me but was not fully oriented to place or situation.  She seems comfortable at present although she says that she has crampy pain with voiding.  Note plan for patient to go to the OR tomorrow for clot evacuation.  I called and spoke with patient's daughter.  Daughter describes a pattern of decline with progressive weakness.  Daughter says she anticipated the patient would no longer be a treatment candidate for the cancer.  Instead, we discussed the option of patient returning home and being followed by hospice.  Daughter would be interested in patient having hospice in the home with future consideration of use of a residential hospice facility if needed.  We discussed CODE STATUS.  Daughter states that patient would not want to be resuscitated nor have her life prolonged artificially on machines.  This is consistent with previous discussions have had with patient will change patient's CODE STATUS to DNR.  PLAN: -Best supportive care -Oxycodone PRN for pain -Hospice referral -DNR     Time Total: 60 minutes  Visit consisted of counseling and education dealing with the complex and emotionally intense issues of symptom management and palliative care in the setting of serious and potentially life-threatening illness.Greater than 50%  of this time was spent counseling and coordinating care related to the above assessment and plan.  Signed by: Altha Harm, PhD, NP-C [phone] (Work Cell)

## 2018-08-21 NOTE — Consult Note (Signed)
[place]  [phone] [phone]  ID: Kristi Thompson OB: 10/10/1923  MR#: [id number]  JEH#:[id number]  Patient Care Team: [name], MD as PCP - General (Internal Medicine) Clent Jacks, RN as Registered Nurse Estevan Oaks, NP as Nurse Practitioner (Hospice and Palliative Medicine) Lloyd Huger, MD as Consulting Physician (Oncology) Borders, Kirt Boys, NP as Nurse Practitioner (Hospice and Palliative Medicine)  CHIEF COMPLAINT: Progressive urothelial cancer, hematuria  INTERVAL HISTORY: Patient is a 85 year old female with multiple medical problems who was recently moved to the hospital with declining hemoglobin secondary to several days of hematuria.  She previously was on chemotherapy with gemcitabine for her progressive urothelial carcinoma, but has not had treatment in greater than 6 weeks.  She is mildly confused, but states she has improved since admission.  She has no focal neurologic complaints.  She denies any fevers.  She has progressive weakness and fatigue and a declining performance status.  She has no chest pain or shortness of breath.  She denies any nausea, vomiting, constipation, or diarrhea.  She does not complain of significant pelvic pain today.  She continues to have hematuria.  Patient offers no further specific complaints.  REVIEW OF SYSTEMS:   Review of Systems  Constitutional: Positive for malaise/fatigue. Negative for fever and weight loss.  Respiratory: Negative.  Negative for cough, hemoptysis and shortness of breath.   Cardiovascular: Negative.  Negative for chest pain and leg swelling.  Gastrointestinal: Negative.  Negative for abdominal pain.  Genitourinary: Positive for hematuria.  Musculoskeletal: Negative for back pain.  Skin: Negative.   Neurological: Positive for weakness. Negative for dizziness, focal weakness and headaches.  Psychiatric/Behavioral: Positive for memory loss.    As per  HPI. Otherwise, a complete review of systems is negative.  PAST MEDICAL HISTORY: Past Medical History:  Diagnosis Date   Asthma    Cancer ([place]) 1978   colon ca   COPD (chronic obstructive pulmonary disease) (HCC)    GERD (gastroesophageal reflux disease)    History of hiatal hernia    Urothelial cancer ([place]) 2019   Urothelial carcinoma    PAST SURGICAL HISTORY: Past Surgical History:  Procedure Laterality Date   ABDOMINAL HYSTERECTOMY     APPENDECTOMY     bowel obstruction     CATARACT EXTRACTION W/ INTRAOCULAR LENS  IMPLANT, BILATERAL     CHOLECYSTECTOMY     COLON SURGERY     colon cancer   COLONOSCOPY WITH PROPOFOL N/A 01/18/2018   Procedure: COLONOSCOPY WITH PROPOFOL;  Surgeon: Jonathon Bellows, MD;  Location: [facility] ENDOSCOPY;  Service: Gastroenterology;  Laterality: N/A;   CYSTOSCOPY W/ URETERAL STENT PLACEMENT Right 02/06/2018   Procedure: CYSTOSCOPY WITH RETROGRADE PYELOGRAM;  Surgeon: Hollice Espy, MD;  Location: [facility] ORS;  Service: Urology;  Laterality: Right;   CYSTOSCOPY WITH URETEROSCOPY Right 02/06/2018   Procedure: CYSTOSCOPY WITH URETEROSCOPY;  Surgeon: Hollice Espy, MD;  Location: [facility] ORS;  Service: Urology;  Laterality: Right;   PORTA CATH INSERTION N/A 02/11/2018   Procedure: PORTA CATH INSERTION;  Surgeon: Algernon Huxley, MD;  Location: [place] CV LAB;  Service: Cardiovascular;  Laterality: N/A;   THYROID SURGERY     nodule removed   THYROIDECTOMY     TONSILLECTOMY     URETERAL BIOPSY Right 02/06/2018   Procedure: URETERAL BIOPSY;  Surgeon: Hollice Espy, MD;  Location: [facility] ORS;  Service: Urology;  Laterality: Right;    FAMILY HISTORY: History reviewed. No pertinent family history.  ADVANCED DIRECTIVES (Y/N):  @  [id number]@  HEALTH MAINTENANCE: Social History   Tobacco Use   Smoking status: Never Smoker   Smokeless tobacco: Never Used  Substance Use Topics   Alcohol use: Yes    Comment: rarely   Drug use: No      Colonoscopy:  PAP:  Bone density:  Lipid panel:  Allergies  Allergen Reactions   Demerol [Meperidine] Swelling   Sulfa Antibiotics Swelling    Current Facility-Administered Medications  Medication Dose Route Frequency Provider Last Rate Last Dose   acetaminophen (TYLENOL) tablet 650 mg  650 mg Oral Q6H PRN Mansy, Jan A, MD       Or   acetaminophen (TYLENOL) suppository 650 mg  650 mg Rectal Q6H PRN Mansy, Jan A, MD       albuterol (PROVENTIL) (2.5 MG/3ML) 0.083% nebulizer solution 3 mL  3 mL Inhalation Q4H PRN Mansy, Jan A, MD       budesonide (PULMICORT) nebulizer solution 0.25 mg  2 mL Inhalation BID Mansy, Jan A, MD   0.25 mg at 08/21/18 1834   docusate sodium (COLACE) capsule 100 mg  100 mg Oral BID Fritzi Mandes, MD   100 mg at 08/21/18 0856   famotidine (PEPCID) tablet 10 mg  10 mg Oral BID Mansy, Jan A, MD   10 mg at 08/21/18 0856   [START ON 08/22/2018] fentaNYL (DURAGESIC) 25 MCG/HR 1 patch  1 patch Transdermal Q72H Mansy, Jan A, MD       fluticasone (FLONASE) 50 MCG/ACT nasal spray 2 spray  2 spray Each Nare QHS Mansy, Jan A, MD   2 spray at 08/20/18 2236   HYDROcodone-acetaminophen (NORCO/VICODIN) 5-325 MG per tablet 1 tablet  1 tablet Oral Q6H PRN Mansy, Jan A, MD       hydrocortisone (ANUSOL-HC) suppository 25 mg  25 mg Rectal BID Mansy, Jan A, MD   25 mg at 08/21/18 1829   magnesium hydroxide (MILK OF MAGNESIA) suspension 30 mL  30 mL Oral Daily PRN Mansy, Jan A, MD       mirabegron ER (MYRBETRIQ) tablet 25 mg  25 mg Oral Daily Mansy, Jan A, MD   25 mg at 08/21/18 0857   montelukast (SINGULAIR) tablet 10 mg  10 mg Oral QHS Mansy, Jan A, MD       multivitamin with minerals tablet 1 tablet  1 tablet Oral Daily Mansy, Jan A, MD   1 tablet at 08/21/18 0857   ondansetron (ZOFRAN) tablet 4 mg  4 mg Oral Q6H PRN Mansy, Jan A, MD       Or   ondansetron [facility]) injection 4 mg  4 mg Intravenous Q6H PRN Mansy, Jan A, MD       oxyCODONE (Oxy IR/ROXICODONE)  immediate release tablet 10 mg  10 mg Oral Q4H PRN Mansy, Jan A, MD   10 mg at 08/21/18 1643   prochlorperazine (COMPAZINE) tablet 10 mg  10 mg Oral Q6H PRN Mansy, Jan A, MD       senna (SENOKOT) tablet 17.2 mg  2 tablet Oral Daily Fritzi Mandes, MD   17.2 mg at 08/21/18 0857   tamsulosin (FLOMAX) capsule 0.4 mg  0.4 mg Oral QPC supper Mansy, Jan A, MD   0.4 mg at 08/21/18 1643   traZODone (DESYREL) tablet 25 mg  25 mg Oral QHS PRN Mansy, Jan A, MD       vitamin B-12 (CYANOCOBALAMIN) tablet 2,500 mcg  2,500 mcg Oral Daily Mansy, Jan A, MD   2,500 mcg at 08/21/18  2355    OBJECTIVE: Vitals:   08/21/18 1834 08/21/18 2005  BP:  129/61  Pulse:  85  Resp:  16  Temp:  97.8 F (36.6 C)  SpO2: 97% 96%     Body mass index is 26.08 kg/m.    ECOG FS:3 - Symptomatic, >50% confined to bed  General: Ill-appearing, no acute distress. Eyes: Pink conjunctiva, anicteric sclera. HEENT: Normocephalic, moist mucous membranes, clear oropharnyx. Lungs: Clear to auscultation bilaterally. Heart: Regular rate and rhythm. No rubs, murmurs, or gallops. Abdomen: Soft, nontender, nondistended. No organomegaly noted, normoactive bowel sounds. Musculoskeletal: No edema, cyanosis, or clubbing. Neuro: Moderately confused. Cranial nerves grossly intact. Skin: No rashes or petechiae noted. Psych: Normal affect.  LAB RESULTS:  Lab Results  Component Value Date   NA 133 (L) 08/21/2018   K 4.2 08/21/2018   CL 100 08/21/2018   CO2 26 08/21/2018   GLUCOSE 99 08/21/2018   BUN 35 (H) 08/21/2018   CREATININE 2.19 (H) 08/21/2018   CALCIUM 7.9 (L) 08/21/2018   PROT 5.5 (L) 08/20/2018   ALBUMIN 2.7 (L) 08/20/2018   AST 23 08/20/2018   ALT 10 08/20/2018   ALKPHOS 107 08/20/2018   BILITOT 1.0 08/20/2018   GFRNONAA 19 (L) 08/21/2018   GFRAA 22 (L) 08/21/2018    Lab Results  Component Value Date   WBC 7.5 08/21/2018   NEUTROABS 7.5 08/20/2018   HGB 8.4 (L) 08/21/2018   HCT 18.5 (L) 08/21/2018   MCV 106.9  (H) 08/21/2018   PLT 140 (L) 08/21/2018     STUDIES: Ct Abdomen Pelvis Wo Contrast  Result Date: 08/12/2018 CLINICAL DATA:  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 Worsening weakness, fatigue and lower abdominal pain. Restaging. EXAM: CT ABDOMEN AND PELVIS WITHOUT CONTRAST TECHNIQUE: Multidetector CT imaging of the abdomen and pelvis was performed following the standard protocol without IV contrast. COMPARISON:  07/09/2018 PET-CT.  01/18/2018 CT abdomen/pelvis. FINDINGS: Lower chest: Small dependent bilateral pleural effusions with dependent bibasilar atelectasis, mildly increased bilaterally. Coronary atherosclerosis. Hepatobiliary: Normal liver size. No liver mass. Cholecystectomy. No biliary ductal dilatation. Pancreas: Normal, with no mass or duct dilation. Spleen: Normal size. No mass. Adrenals/Urinary Tract: Normal adrenals. No appreciable change in severe chronic right hydroureteronephrosis to the level of the lower lumbar ureter. Stable asymmetric severe atrophy of the right renal parenchyma. No left hydronephrosis. No contour deforming renal masses. No renal stones. Irregular infiltrative solid right pelvic periureteric mass measures 5.8 x 5.1 cm at the level of the upper right pelvis (series 2/image 55), increased from 4.8 x 4.3 cm on 07/09/2018 PET-CT, and measures 5.7 x 5.0 cm at the level of the right ureterovesical junction (series 2/image 63), stable from 5.7 x 5.0 cm. No acute bladder abnormality. Stomach/Bowel: Normal non-distended stomach. Normal caliber small bowel with no small bowel wall thickening. Appendectomy. Oral contrast transits to the colon. Eccentric segmental wall thickening in the mid transverse colon (series 2/image 27) has not appreciably changed. No new sites of large bowel wall thickening. Moderate colorectal stool volume. Vascular/Lymphatic: Atherosclerotic nonaneurysmal abdominal aorta. Mildly enlarged 1.0 cm right posterior paracaval node (series  2/image 26), increased from 0.7 cm. No additional new pathologically enlarged abdominopelvic nodes. Reproductive: Hysterectomy.  No new adnexal masses. Other: No pneumoperitoneum or focal fluid collection. Small volume left lower quadrant ascites is mildly increased. Stable mild presacral space edema. Anasarca is increased. Musculoskeletal: No aggressive appearing focal osseous lesions. Moderate lumbar spondylosis. IMPRESSION: 1. Mild right posterior paracaval adenopathy, mildly increased. Irregular infiltrative periureteric neoplasm  at the level of the upper right pelvis is increased. Findings suggest mild tumor progression. 2. Eccentric segmental wall thickening in the mid transverse colon appears unchanged. 3. Small dependent bilateral pleural effusions, mildly increased bilaterally. Small volume left lower quadrant ascites is mildly increased. Anasarca is increased. 4. Chronic right hydroureteronephrosis and right renal atrophy is unchanged. 5.  Aortic Atherosclerosis (ICD10-I70.0). Electronically Signed   By: Ilona Sorrel M.D.   On: 08/12/2018 15:48   Mr Brain Wo Contrast  Result Date: 07/30/2018 CLINICAL DATA:  Slurred speech, generalized weakness, and confusion. History of urothelial carcinoma. No IV contrast administered due to diminished renal function. EXAM: MRI HEAD WITHOUT CONTRAST TECHNIQUE: Multiplanar, multiecho pulse sequences of the brain and surrounding structures were obtained without intravenous contrast. COMPARISON:  Head CT 02/22/2015 FINDINGS: Brain: There is no evidence of acute infarct, intracranial hemorrhage, mass, midline shift, or extra-axial fluid collection. Periventricular and deep cerebral white matter T2/FLAIR hyperintensities are nonspecific but compatible with minimal chronic small vessel ischemic disease. The ventricles and sulci are within normal limits for age. Vascular: Major intracranial vascular flow voids are preserved. Skull and upper cervical spine: No suspicious  marrow lesion. Sinuses/Orbits: Bilateral cataract extraction. Mild right maxillary sinus mucosal thickening. Clear mastoid air cells. Other: 1.7 cm T2 hyperintense subcutaneous lesion in the left face, likely a sebaceous cyst. IMPRESSION: 1. No acute intracranial abnormality. 2. Minimal chronic small vessel ischemic disease. Electronically Signed   By: Logan Bores M.D.   On: 07/30/2018 18:03   US Renal  Result Date: 08/21/2018 CLINICAL DATA:  Gross hematuria. EXAM: RENAL / URINARY TRACT ULTRASOUND COMPLETE COMPARISON:  CT scan August 12, 2018 FINDINGS: Right Kidney: Renal measurements: 7.6 x 3.7 x 3.2 cm = volume: 45.9 mL. Severe hydronephrosis remains. Left Kidney: Renal measurements: 10.0 x 5.3 x 4.0 cm = volume: 111 mL. Echogenicity within normal limits. No mass or hydronephrosis visualized. Bladder: The patient has known bladder mass remains measuring 9.5 x 4.7 x 8.1 cm today. IMPRESSION: 1. Persistent severe right hydronephrosis. 2. Known right bladder mass measuring 9.5 x 4.7 x 8.1 cm today. Electronically Signed   By: Dorise Bullion III M.D   On: 08/21/2018 14:55   US Venous Img Lower Unilateral Right  Result Date: 07/25/2018 CLINICAL DATA:  Right lower extremity edema. History of bladder cancer. Evaluate for DVT. EXAM: RIGHT LOWER EXTREMITY VENOUS DOPPLER ULTRASOUND TECHNIQUE: Gray-scale sonography with graded compression, as well as color Doppler and duplex ultrasound were performed to evaluate the lower extremity deep venous systems from the level of the common femoral vein and including the common femoral, femoral, profunda femoral, popliteal and calf veins including the posterior tibial, peroneal and gastrocnemius veins when visible. The superficial great saphenous vein was also interrogated. Spectral Doppler was utilized to evaluate flow at rest and with distal augmentation maneuvers in the common femoral, femoral and popliteal veins. COMPARISON:  Right lower extremity venous Doppler  ultrasound-04/11/2018; 07/24/2014 FINDINGS: Contralateral Common Femoral Vein: Respiratory phasicity is normal and symmetric with the symptomatic side. No evidence of thrombus. Normal compressibility. Common Femoral Vein: There is hypoechoic nonocclusive DVT within the distal aspect of the right femoral vein (representative images 8 and 11). Saphenofemoral Junction: No evidence of thrombus. Normal compressibility and flow on color Doppler imaging. Profunda Femoral Vein: There is hypoechoic occlusive thrombus within the interrogated portions the right deep femoral vein (image 18). Femoral Vein: There is hypoechoic nonocclusive thrombus within the proximal (image 20) and mid (image 23) aspects of the right femoral vein. The  distal aspect the right femoral vein appears patent. Popliteal Vein: No evidence of thrombus. Normal compressibility, respiratory phasicity and response to augmentation. Calf Veins: No evidence of thrombus. Normal compressibility and flow on color Doppler imaging. Superficial Great Saphenous Vein: No evidence of thrombus. Normal compressibility. Venous Reflux:  None. Other Findings: Moderate amount of subcutaneous edema is noted at the level of the right lower leg and calf. IMPRESSION: Examination is positive for occlusive DVT involving the right deep femoral vein. Nonocclusive thrombus is also seen within the right common femoral vein as well as the proximal and mid aspects of the right superficial femoral vein. Electronically Signed   By: Sandi Mariscal M.D.   On: 07/25/2018 16:04    ASSESSMENT: Progressive urothelial cancer, hematuria  PLAN:    1.  Progressive urothelial cancer: Patient previously was receiving single agent gemcitabine, but has not had treatment in greater than 6 weeks secondary to declining performance status.  CT scan results from August 12, 2018 revealed mildly progressive disease.  Given her declining performance status, no further chemotherapy treatments are planned.  Case  discussed with hospitalist as well as palliative care.  Agree with referral to hospice. 2.  Hematuria: Appreciate urology input.  Likely related to underlying malignancy.  Eliquis has been discontinued.  Plan for cystoscopy tomorrow for clot removal. 3.  Acute on chronic renal insufficiency: Likely multifactorial.  Patient's most recent creatinine is 2.19. 4.  Anemia: Secondary to hematuria.  Patient's hemoglobin has improved to 8.4 with multiple units of packed red blood cells. 5.  Disposition: Case discussed with palliative care.  Given patient's steady decline over the past 4 to 6 weeks, agree with hospice, possibly at home.  Patient is now DNR.  Appreciate consult, will follow.   Lloyd Huger, MD   08/21/2018 10:24 PM

## 2018-08-21 NOTE — Progress Notes (Signed)
Urology Consult Follow Up  Subjective: Continues to complain of lower abdominal pain/pressure.  She reports that she will start to urinate without warning and then at the end of urination have crampy pain.  Hemoglobin down significantly overnight, received 2 units of packed red blood cells today.  Renal ultrasound shows large clot in bladder.  No left-sided hydronephrosis, chronic severe right hydronephrosis.  Anti-infectives: Anti-infectives (From admission, onward)   Start     Dose/Rate Route Frequency Ordered Stop   08/20/18 1000  sulfamethoxazole-trimethoprim (BACTRIM DS) 800-160 MG per tablet 1 tablet  Status:  Discontinued     1 tablet Oral 2 times daily 08/20/18 0631 08/20/18 0702   08/20/18 0515  cephALEXin (KEFLEX) capsule 500 mg     500 mg Oral  Once 08/20/18 0500 08/20/18 0518      Current Facility-Administered Medications  Medication Dose Route Frequency Provider Last Rate Last Dose  . acetaminophen (TYLENOL) tablet 650 mg  650 mg Oral Q6H PRN Mansy, Jan A, MD       Or  . acetaminophen (TYLENOL) suppository 650 mg  650 mg Rectal Q6H PRN Mansy, Jan A, MD      . albuterol (PROVENTIL) (2.5 MG/3ML) 0.083% nebulizer solution 3 mL  3 mL Inhalation Q4H PRN Mansy, Jan A, MD      . budesonide (PULMICORT) nebulizer solution 0.25 mg  2 mL Inhalation BID Mansy, Jan A, MD   0.25 mg at 08/21/18 0752  . docusate sodium (COLACE) capsule 100 mg  100 mg Oral BID Fritzi Mandes, MD   100 mg at 08/21/18 0856  . famotidine (PEPCID) tablet 10 mg  10 mg Oral BID Mansy, Jan A, MD   10 mg at 08/21/18 0856  . [START ON 08/22/2018] fentaNYL (DURAGESIC) 25 MCG/HR 1 patch  1 patch Transdermal Q72H Mansy, Jan A, MD      . fluticasone [facility]) 50 MCG/ACT nasal spray 2 spray  2 spray Each Nare QHS Mansy, Arvella Merles, MD   2 spray at 08/20/18 2236  . HYDROcodone-acetaminophen (NORCO/VICODIN) 5-325 MG per tablet 1 tablet  1 tablet Oral Q6H PRN Mansy, Jan A, MD      . hydrocortisone (ANUSOL-HC) suppository 25 mg  25 mg  Rectal BID Mansy, Jan A, MD   25 mg at 08/21/18 0907  . magnesium hydroxide (MILK OF MAGNESIA) suspension 30 mL  30 mL Oral Daily PRN Mansy, Jan A, MD      . mirabegron ER [facility]) tablet 25 mg  25 mg Oral Daily Mansy, Jan A, MD   25 mg at 08/21/18 0857  . montelukast (SINGULAIR) tablet 10 mg  10 mg Oral QHS Mansy, Jan A, MD      . multivitamin with minerals tablet 1 tablet  1 tablet Oral Daily Mansy, Jan A, MD   1 tablet at 08/21/18 0857  . ondansetron (ZOFRAN) tablet 4 mg  4 mg Oral Q6H PRN Mansy, Jan A, MD       Or  . ondansetron [facility]) injection 4 mg  4 mg Intravenous Q6H PRN Mansy, Jan A, MD      . oxyCODONE (Oxy IR/ROXICODONE) immediate release tablet 10 mg  10 mg Oral Q4H PRN Mansy, Jan A, MD   10 mg at 08/21/18 1643  . prochlorperazine (COMPAZINE) tablet 10 mg  10 mg Oral Q6H PRN Mansy, Jan A, MD      . senna (SENOKOT) tablet 17.2 mg  2 tablet Oral Daily Fritzi Mandes, MD   17.2 mg at 08/21/18  1478  . tamsulosin (FLOMAX) capsule 0.4 mg  0.4 mg Oral QPC supper Mansy, Jan A, MD   0.4 mg at 08/21/18 1643  . traZODone (DESYREL) tablet 25 mg  25 mg Oral QHS PRN Mansy, Jan A, MD      . vitamin B-12 (CYANOCOBALAMIN) tablet 2,500 mcg  2,500 mcg Oral Daily Mansy, Jan A, MD   2,500 mcg at 08/21/18 0856     Objective: Vital signs in last 24 hours: Temp:  [97.6 F (36.4 C)-98.6 F (37 C)] 98.1 F (36.7 C) (06/10 1553) Pulse Rate:  [69-83] 83 (06/10 1553) Resp:  [16-19] 18 (06/10 1553) BP: (99-155)/(53-68) 155/68 (06/10 1553) SpO2:  [95 %-98 %] 96 % (06/10 1553)  Intake/Output from previous day: 06/09 0701 - 06/10 0700 In: 233.4 [P.O.:20; I.V.:213.4] Out: -  Intake/Output this shift: Total I/O In: 398 [I.V.:13; Blood:385] Out: -    Physical Exam  More alert and interactive today.  Appears comfortable. Abdomen soft nontender, nondistended.  Bladder nonpalpable.  Lab Results:  Recent Labs    08/20/18 0127 08/20/18 0517 08/21/18 0538  WBC 9.5  --  7.5  HGB 8.8* 8.1* 6.0*   HCT 26.8* 24.7* 18.5*  PLT 176  --  140*   BMET Recent Labs    08/20/18 0127 08/21/18 0538  NA 135 133*  K 3.8 4.2  CL 101 100  CO2 28 26  GLUCOSE 124* 99  BUN 25* 35*  CREATININE 1.44* 2.19*  CALCIUM 8.2* 7.9*   PT/INR Recent Labs    08/20/18 0341  LABPROT 22.9*  INR 2.1*   ABG No results for input(s): PHART, HCO3 in the last 72 hours.  Invalid input(s): PCO2, PO2  Studies/Results: US Renal  Result Date: 08/21/2018 CLINICAL DATA:  Gross hematuria. EXAM: RENAL / URINARY TRACT ULTRASOUND COMPLETE COMPARISON:  CT scan August 12, 2018 FINDINGS: Right Kidney: Renal measurements: 7.6 x 3.7 x 3.2 cm = volume: 45.9 mL. Severe hydronephrosis remains. Left Kidney: Renal measurements: 10.0 x 5.3 x 4.0 cm = volume: 111 mL. Echogenicity within normal limits. No mass or hydronephrosis visualized. Bladder: The patient has known bladder mass remains measuring 9.5 x 4.7 x 8.1 cm today. IMPRESSION: 1. Persistent severe right hydronephrosis. 2. Known right bladder mass measuring 9.5 x 4.7 x 8.1 cm today. Electronically Signed   By: Dorise Bullion III M.D   On: 08/21/2018 14:55   Renal ultrasound was personally reviewed.  Disagree with radiologic interpretation, she has no known bladder mass and lesion in the bladder measuring 9/2 cm is likely a large clot.  Assessment/ Plan:  1.  Gross hematuria- likely related to underlying malignancy.  Anticoagulation stopped.  Continues to have gross hematuria with large clot burden in bladder.  Given the size of the clot, especially unlikely to clear this at bedside and this would also be quite uncomfortable.  As such, I have offered to take her tomorrow for cystoscopy, clot evacuation.  Bladder scan approximately 300 cc at bedside today which is reasonable to wait until tomorrow when n.p.o. for the procedure.  Reviewed risk and benefits in detail including risk of ongoing hematuria, infection, failure to resolve discomfort amongst others.  I did also leave a  message on her daughter's machine outlining the above plan. -NPO AT MN for clot evacuation  2.  Acute on chronic renal insufficiency- etiology unclear.  Renal bladder ultrasound shows no evidence of overt clot retention, no left hydronephrosis.  Continue to monitor.  3, Acute blood loss anemia-received 2  units of packed red blood cells.  Continue supportive care, monitor creatinine and hold anticoagulation.  Unfortunately if the bleeding is related to the tumor, options may be limited.  I discussed this with the patient again today and she understands.  4.  Metastatic upper tract urothelial carcinoma-prognosis poor.      LOS: 1 day    Hollice Espy 08/21/2018

## 2018-08-22 ENCOUNTER — Inpatient Hospital Stay: Payer: Medicare Other | Admitting: Hospice and Palliative Medicine

## 2018-08-22 ENCOUNTER — Inpatient Hospital Stay: Payer: Medicare Other | Admitting: Anesthesiology

## 2018-08-22 ENCOUNTER — Encounter: Payer: Self-pay | Admitting: *Deleted

## 2018-08-22 ENCOUNTER — Inpatient Hospital Stay: Payer: Medicare Other | Admitting: Oncology

## 2018-08-22 ENCOUNTER — Encounter
Admission: EM | Disposition: A | Discharge status: Hospice | Payer: Self-pay | Source: Home / Self Care | Attending: Internal Medicine

## 2018-08-22 ENCOUNTER — Telehealth: Payer: Self-pay | Admitting: *Deleted

## 2018-08-22 ENCOUNTER — Inpatient Hospital Stay: Payer: Medicare Other

## 2018-08-22 DIAGNOSIS — R310 Gross hematuria: Secondary | ICD-10-CM | POA: Diagnosis not present

## 2018-08-22 DIAGNOSIS — Z8551 Personal history of malignant neoplasm of bladder: Secondary | ICD-10-CM | POA: Diagnosis not present

## 2018-08-22 HISTORY — PX: CYSTOSCOPY WITH FULGERATION: SHX6638

## 2018-08-22 SURGERY — CYSTOSCOPY, WITH BLADDER FULGURATION
Anesthesia: General

## 2018-08-22 MED ORDER — DEXAMETHASONE SODIUM PHOSPHATE 10 MG/ML IJ SOLN
INTRAMUSCULAR | Status: DC | PRN
  Administered 2018-08-22: 5 mg via INTRAVENOUS

## 2018-08-22 MED ORDER — PROPOFOL 10 MG/ML IV BOLUS
INTRAVENOUS | Status: AC
  Filled 2018-08-22: qty 20

## 2018-08-22 MED ORDER — CEFAZOLIN SODIUM-DEXTROSE 1-4 GM/50ML-% IV SOLN
INTRAVENOUS | Status: DC | PRN
  Administered 2018-08-22: 1 g via INTRAVENOUS

## 2018-08-22 MED ORDER — LIDOCAINE HCL (PF) 2 % IJ SOLN
INTRAMUSCULAR | Status: AC
  Filled 2018-08-22: qty 10

## 2018-08-22 MED ORDER — FENTANYL CITRATE (PF) 100 MCG/2ML IJ SOLN
INTRAMUSCULAR | Status: AC
  Filled 2018-08-22: qty 2

## 2018-08-22 MED ORDER — PHENYLEPHRINE HCL (PRESSORS) 10 MG/ML IV SOLN
INTRAVENOUS | Status: DC | PRN
  Administered 2018-08-22: 100 ug via INTRAVENOUS

## 2018-08-22 MED ORDER — ONDANSETRON HCL 4 MG/2ML IJ SOLN
INTRAMUSCULAR | Status: AC
  Filled 2018-08-22: qty 2

## 2018-08-22 MED ORDER — OXYCODONE HCL 5 MG PO TABS: 5.0000 mg | ORAL_TABLET | Freq: Once | ORAL | Status: DC | PRN

## 2018-08-22 MED ORDER — FENTANYL CITRATE (PF) 100 MCG/2ML IJ SOLN
INTRAMUSCULAR | Status: DC | PRN
  Administered 2018-08-22 (×4): 25 ug via INTRAVENOUS

## 2018-08-22 MED ORDER — PROPOFOL 10 MG/ML IV BOLUS
INTRAVENOUS | Status: DC | PRN
  Administered 2018-08-22: 90 mg via INTRAVENOUS

## 2018-08-22 MED ORDER — ONDANSETRON HCL 4 MG/2ML IJ SOLN
INTRAMUSCULAR | Status: DC | PRN
  Administered 2018-08-22: 4 mg via INTRAVENOUS

## 2018-08-22 MED ORDER — CEFAZOLIN SODIUM 1 G IJ SOLR
INTRAMUSCULAR | Status: AC
  Filled 2018-08-22: qty 10

## 2018-08-22 MED ORDER — DEXAMETHASONE SODIUM PHOSPHATE 10 MG/ML IJ SOLN
INTRAMUSCULAR | Status: AC
  Filled 2018-08-22: qty 1

## 2018-08-22 MED ORDER — SODIUM CHLORIDE 0.9 % IV SOLN
INTRAVENOUS | Status: DC | PRN
  Administered 2018-08-22: 14:00:00 via INTRAVENOUS

## 2018-08-22 MED ORDER — OXYCODONE HCL 5 MG/5ML PO SOLN: 5.0000 mg | Freq: Once | ORAL | Status: DC | PRN

## 2018-08-22 SURGICAL SUPPLY — 18 items
3 way hematuria catheter ×3 IMPLANT
BAG DRAIN CYSTO-URO LG1000N (MISCELLANEOUS) ×3 IMPLANT
BAG URINE DRAINAGE (UROLOGICAL SUPPLIES) ×3 IMPLANT
CATH FOL 3WAY LX COUV 22X75 (CATHETERS) ×3 IMPLANT
ELECT REM PT RETURN 9FT ADLT (ELECTROSURGICAL) ×3
ELECTRODE REM PT RTRN 9FT ADLT (ELECTROSURGICAL) ×1 IMPLANT
GLOVE BIOGEL M 6.5 STRL (GLOVE) ×3 IMPLANT
GOWN STRL REUS W/ TWL LRG LVL3 (GOWN DISPOSABLE) ×1 IMPLANT
GOWN STRL REUS W/TWL LRG LVL3 (GOWN DISPOSABLE) ×2
HOLDER FOLEY CATH W/STRAP (MISCELLANEOUS) ×3 IMPLANT
PACK CYSTO AR (MISCELLANEOUS) ×3 IMPLANT
SET CYSTO W/LG BORE CLAMP LF (SET/KITS/TRAYS/PACK) ×3 IMPLANT
SET IRRIG Y TYPE TUR BLADDER L (SET/KITS/TRAYS/PACK) ×3 IMPLANT
SOL .9 NS 3000ML IRR  AL (IV SOLUTION) ×4
SOL .9 NS 3000ML IRR UROMATIC (IV SOLUTION) ×2 IMPLANT
SYRINGE IRR TOOMEY STRL 70CC (SYRINGE) ×3 IMPLANT
WATER STERILE IRR 1000ML POUR (IV SOLUTION) ×3 IMPLANT
WATER STERILE IRR 3000ML UROMA (IV SOLUTION) ×6 IMPLANT

## 2018-08-22 NOTE — OR Nursing (Signed)
125ml clot output.

## 2018-08-22 NOTE — Telephone Encounter (Signed)
Yes

## 2018-08-22 NOTE — Progress Notes (Signed)
Kristi Thompson    MR#:  [id number]  DATE OF BIRTH:  05-03-1923  SUBJECTIVE:  Hard on hearing--some baseline confusion Continues with hematuria  REVIEW OF SYSTEMS:   Review of Systems  Constitutional: Negative for chills, fever and weight loss.  HENT: Negative for ear discharge, ear pain and nosebleeds.   Eyes: Negative for blurred vision, pain and discharge.  Respiratory: Negative for sputum production, shortness of breath, wheezing and stridor.   Cardiovascular: Negative for chest pain, palpitations, orthopnea and PND.  Gastrointestinal: Positive for constipation. Negative for abdominal pain, diarrhea, nausea and vomiting.  Genitourinary: Positive for hematuria. Negative for frequency and urgency.  Musculoskeletal: Negative for back pain and joint pain.  Neurological: Positive for weakness. Negative for sensory change, speech change and focal weakness.  Psychiatric/Behavioral: Negative for depression and hallucinations. The patient is not nervous/anxious.    Tolerating Diet:yes   DRUG ALLERGIES:   Allergies  Allergen Reactions  . Demerol [Meperidine] Swelling  . Sulfa Antibiotics Swelling    VITALS:  Blood pressure (!) 111/54, pulse 75, temperature 97.6 F (36.4 C), temperature source Oral, resp. rate 16, height 5\' 1"  (1.549 m), weight 62.6 kg, SpO2 98 %.  PHYSICAL EXAMINATION:   Physical Exam  GENERAL:  85 y.o.-year-old patient lying in the bed with no acute distress.  EYES: Pupils equal, round, reactive to light and accommodation. No scleral icterus. Pallor+ HEENT: Head atraumatic, normocephalic. Oropharynx and nasopharynx clear.  NECK:  Supple, no jugular venous distention. No thyroid enlargement, no tenderness.  LUNGS: Normal breath sounds bilaterally, no wheezing, rales, rhonchi. No use of accessory muscles of respiration.  CARDIOVASCULAR: S1, S2 normal. No murmurs, rubs, or gallops.   ABDOMEN: Soft, nontender, nondistended. Bowel sounds present. No organomegaly or mass. Ongoing hematurria EXTREMITIES: No cyanosis, clubbing or edema b/l.    NEUROLOGIC: Cranial nerves II through XII are intact. No focal Motor or sensory deficits b/l.   PSYCHIATRIC:  patient is alert and oriented x 2.  SKIN: No obvious rash, lesion, or ulcer.   LABORATORY PANEL:  CBC Recent Labs  Lab 08/21/18 0538  08/21/18 2311  WBC 7.5  --   --   HGB 6.0*   < > 7.7*  HCT 18.5*  --   --   PLT 140*  --   --    < > = values in this interval not displayed.    Chemistries  Recent Labs  Lab 08/20/18 0127 08/21/18 0538  NA 135 133*  K 3.8 4.2  CL 101 100  CO2 28 26  GLUCOSE 124* 99  BUN 25* 35*  CREATININE 1.44* 2.19*  CALCIUM 8.2* 7.9*  AST 23  --   ALT 10  --   ALKPHOS 107  --   BILITOT 1.0  --    Cardiac Enzymes No results for input(s): TROPONINI in the last 168 hours. RADIOLOGY:  US Renal  Result Date: 08/21/2018 CLINICAL DATA:  Gross hematuria. EXAM: RENAL / URINARY TRACT ULTRASOUND COMPLETE COMPARISON:  CT scan August 12, 2018 FINDINGS: Right Kidney: Renal measurements: 7.6 x 3.7 x 3.2 cm = volume: 45.9 mL. Severe hydronephrosis remains. Left Kidney: Renal measurements: 10.0 x 5.3 x 4.0 cm = volume: 111 mL. Echogenicity within normal limits. No mass or hydronephrosis visualized. Bladder: The patient has known bladder mass remains measuring 9.5 x 4.7 x 8.1 cm today. IMPRESSION: 1. Persistent severe right hydronephrosis. 2. Known right bladder mass measuring 9.5 x 4.7  x 8.1 cm today. Electronically Signed   By: Dorise Bullion III M.D   On: 08/21/2018 14:55   ASSESSMENT AND PLAN:  Kristi Thompson  is a 85 y.o. Caucasian female with a known history of multiple medical problems t including urothelial cancer, presented to the emergency room with onset of hematuria this morning with associated dysuria.  While in the ER her hemoglobin dropped from 8.8-8.1  1.  Hematuria with subsequent acute  blood loss anemia in the setting of urothelial carcinoma - urology consultation with Dr Erlene Quan noted--for clot evacuation today -patient's Eliquis will be discontinued -8.8--8.1--6.0--1 unit BT--8.4--7.7 -Dr Jolee Ewing  [employer] input appreciated--dter is agreeable with home and hospice . Understands poor prognosis  2. H/o DVT -hold eliquis  3.  Asthma.  continue her Pulmicort and albuterol.  She has no current exacerbation.  4.  GERD.  H2 blocker therapy will be continued.  5.  DVT prophylaxis.  SCDs.  -her Eliquis will hold off given her hematuria.  6. Acute on CKD-III -US renal ordered shows severe right hydronephrosis and Clot retention -creat 2.12 -baseline creat 1.5  D/w [name] on the phone--she does understand poor prognosis about her mom   CODE STATUS:DNR DVT Prophylaxis: SCD  TOTAL TIME TAKING CARE OF THIS PATIENT: *30* minutes.  >50% time spent on counselling and coordination of care  POSSIBLE D/C IN 1-2 DAYS, DEPENDING ON CLINICAL CONDITION.  Note: This dictation was prepared with Dragon dictation along with smaller phrase technology. Any transcriptional errors that result from this process are unintentional.  Fritzi Mandes M.D on 08/22/2018 at 10:14 AM  Between 7am to 6pm - Pager - [phone]  After 6pm go to [email or web address] - password [email or web address] [facility]  Office  [phone]  [name]; [name], MDPatient ID: Kristi Thompson, female   DOB: Jun 25, 1923, 85 y.o.   MRN: [id number]

## 2018-08-22 NOTE — Anesthesia Post-op Follow-up Note (Signed)
Anesthesia QCDR form completed.        

## 2018-08-22 NOTE — Progress Notes (Signed)
CBI restarted due to light red urine in foley, urine now running clear with with very slow drip in CBI.  Clarise Cruz, BSN

## 2018-08-22 NOTE — Transfer of Care (Signed)
Immediate Anesthesia Transfer of Care Note  Patient: Kristi Thompson  Procedure(s) Performed: CYSTOSCOPY WITH FULGERATION (N/A )  Patient Location: PACU  Anesthesia Type:General  Level of Consciousness: sedated  Airway & Oxygen Therapy: Patient Spontanous Breathing and Patient connected to face mask oxygen  Post-op Assessment: Report given to RN and Post -op Vital signs reviewed and stable  Post vital signs: Reviewed  Last Vitals:  Vitals Value Taken Time  BP 138/55 08/22/18 1458  Temp 36.3 C 08/22/18 1455  Pulse 57 08/22/18 1458  Resp 10 08/22/18 1458  SpO2 100 % 08/22/18 1458    Last Pain:  Vitals:   08/22/18 1455  TempSrc:   PainSc: 0-No pain         Complications: No apparent anesthesia complications

## 2018-08-22 NOTE — Anesthesia Preprocedure Evaluation (Addendum)
Anesthesia Evaluation  Patient identified by MRN, date of birth, ID band Patient awake    Reviewed: Allergy & Precautions, H&P , NPO status , Patient's Chart, lab work & pertinent test results  Airway Mallampati: II  TM Distance: >3 FB     Dental  (+) Teeth Intact   Pulmonary asthma , COPD,           Cardiovascular hypertension,      Neuro/Psych negative neurological ROS  negative psych ROS   GI/Hepatic Neg liver ROS, hiatal hernia, GERD  Controlled,  Endo/Other  negative endocrine ROS  Renal/GU CRFRenal disease     Musculoskeletal   Abdominal   Peds  Hematology  (+) Blood dyscrasia, anemia ,   Anesthesia Other Findings Past Medical History: No date: Asthma 1978: Cancer ([place])     Comment:  colon ca No date: COPD (chronic obstructive pulmonary disease) (HCC) No date: GERD (gastroesophageal reflux disease) No date: History of hiatal hernia 2019: Urothelial cancer ([place])     Comment:  Urothelial carcinoma  Past Surgical History: No date: ABDOMINAL HYSTERECTOMY No date: APPENDECTOMY No date: bowel obstruction No date: CATARACT EXTRACTION W/ INTRAOCULAR LENS  IMPLANT, BILATERAL No date: CHOLECYSTECTOMY No date: COLON SURGERY     Comment:  colon cancer 01/18/2018: COLONOSCOPY WITH PROPOFOL; N/A     Comment:  Procedure: COLONOSCOPY WITH PROPOFOL;  Surgeon: Jonathon Bellows, MD;  Location: [facility] ENDOSCOPY;  Service:               Gastroenterology;  Laterality: N/A; 02/06/2018: CYSTOSCOPY W/ URETERAL STENT PLACEMENT; Right     Comment:  Procedure: CYSTOSCOPY WITH RETROGRADE PYELOGRAM;                Surgeon: Hollice Espy, MD;  Location: [facility] ORS;                Service: Urology;  Laterality: Right; 02/06/2018: CYSTOSCOPY WITH URETEROSCOPY; Right     Comment:  Procedure: CYSTOSCOPY WITH URETEROSCOPY;  Surgeon:               Hollice Espy, MD;  Location: [facility] ORS;  Service:               Urology;   Laterality: Right; 02/11/2018: PORTA CATH INSERTION; N/A     Comment:  Procedure: PORTA CATH INSERTION;  Surgeon: Algernon Huxley,              MD;  Location: [place] CV LAB;  Service:               Cardiovascular;  Laterality: N/A; No date: THYROID SURGERY     Comment:  nodule removed No date: THYROIDECTOMY No date: TONSILLECTOMY 02/06/2018: URETERAL BIOPSY; Right     Comment:  Procedure: URETERAL BIOPSY;  Surgeon: Hollice Espy,               MD;  Location: [facility] ORS;  Service: Urology;  Laterality:               Right;  BMI    Body Mass Index: 26.08 kg/m      Reproductive/Obstetrics negative OB ROS                            Anesthesia Physical Anesthesia Plan  ASA: III  Anesthesia Plan: General LMA   Post-op Pain Management:    Induction:   PONV Risk  Score and Plan: Dexamethasone, Ondansetron and Treatment may vary due to age or medical condition  Airway Management Planned:   Additional Equipment:   Intra-op Plan:   Post-operative Plan:   Informed Consent: I have reviewed the patients History and Physical, chart, labs and discussed the procedure including the risks, benefits and alternatives for the proposed anesthesia with the patient or authorized representative who has indicated his/her understanding and acceptance.     Dental Advisory Given  Plan Discussed with: Anesthesiologist and CRNA  Anesthesia Plan Comments:        Anesthesia Quick Evaluation

## 2018-08-22 NOTE — Anesthesia Procedure Notes (Signed)
Procedure Name: LMA Insertion Performed by: Rolla Plate, CRNA Pre-anesthesia Checklist: Patient identified, Patient being monitored, Timeout performed, Emergency Drugs available and Suction available Patient Re-evaluated:Patient Re-evaluated prior to induction Oxygen Delivery Method: Circle system utilized Preoxygenation: Pre-oxygenation with 100% oxygen Induction Type: IV induction Ventilation: Mask ventilation without difficulty LMA: LMA inserted LMA Size: 3.0 Tube type: Oral Number of attempts: 1 Placement Confirmation: positive ETCO2 and breath sounds checked- equal and bilateral Tube secured with: Tape Dental Injury: Teeth and Oropharynx as per pre-operative assessment

## 2018-08-22 NOTE — Telephone Encounter (Signed)
VO called to [facility] triage nurse Crystal

## 2018-08-22 NOTE — TOC Initial Note (Signed)
Transition of Care [facility]) - Initial/Assessment Note    Patient Details  Name: Kristi Thompson MRN: [id number] Date of Birth: 1924/01/19  [facility]) CM/SW Contact:    Annamaria Boots, [place] Phone Number: 08/22/2018, 10:14 AM  Clinical Narrative:   CSW consulted for hospice services at home. CSW contacted patient's daughter regarding hospice services. Daughter reports that patient was living independently but has been living with her for the past 7 months. Daughter reports that she does want to take patient home but would like the option of hospice home in the future. Daughter would like to use [facility] and thinks that the patient will need a hospital bed. CSW notified Santiago Glad with [facility] of referral. CSW will continue to follow for discharge planning.                    Expected Discharge Plan: [place]     Patient Goals and CMS Choice   CMS Medicare.gov Compare Post Acute Care list provided to:: Patient Represenative (must comment)(Daughter) Choice offered to / list presented to : Adult Children  Expected Discharge Plan and Services Expected Discharge Plan: [place] Acute Care Choice: Hospice Living arrangements for the past 2 months: Single Family Home                                      Prior Living Arrangements/Services Living arrangements for the past 2 months: Single Family Home Lives with:: Adult Children Patient language and need for interpreter reviewed:: Yes Do you feel safe going back to the place where you live?: Yes      Need for Family Participation in Patient Care: Yes (Comment) Care giver support system in place?: Yes (comment)   Criminal Activity/Legal Involvement Pertinent to Current Situation/Hospitalization: No - Comment as needed  Activities of Daily Living Home Assistive Devices/Equipment: None ADL Screening (condition at time of admission) Patient's cognitive ability adequate to  safely complete daily activities?: Yes Is the patient deaf or have difficulty hearing?: Yes Does the patient have difficulty seeing, even when wearing glasses/contacts?: No Does the patient have difficulty concentrating, remembering, or making decisions?: No Patient able to express need for assistance with ADLs?: Yes Does the patient have difficulty dressing or bathing?: Yes Independently performs ADLs?: No Communication: Independent Dressing (OT): Needs assistance Is this a change from baseline?: Pre-admission baseline Grooming: Independent Feeding: Independent Bathing: Needs assistance Is this a change from baseline?: Pre-admission baseline Toileting: Needs assistance Is this a change from baseline?: Pre-admission baseline In/Out Bed: Needs assistance Is this a change from baseline?: Pre-admission baseline Walks in Home: Needs assistance Is this a change from baseline?: Pre-admission baseline Does the patient have difficulty walking or climbing stairs?: Yes Weakness of Legs: Both Weakness of Arms/Hands: None  Permission Sought/Granted Permission sought to share information with : Case Manager, Customer service manager, Family Supports Permission granted to share information with : Yes, Verbal Permission Granted              Emotional Assessment Appearance:: Appears stated age   Affect (typically observed): Accepting, Quiet Orientation: : Oriented to Self, Oriented to Place Alcohol / Substance Use: Not Applicable Psych Involvement: No (comment)  Admission diagnosis:  Gross hematuria [R31.0] Acute on chronic blood loss anemia [D62] Patient Active Problem List   Diagnosis Date Noted  . Acute on  chronic blood loss anemia   . Palliative care encounter   . Blood loss anemia 08/20/2018  . Iron deficiency anemia 04/26/2018  . Urothelial carcinoma ([place]) 02/12/2018  . 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 Asthma 02/01/2018   . Bowel obstruction ([place]) 02/01/2018  . COPD (chronic obstructive pulmonary disease) ([place]) 02/01/2018  . H/O spontaneous bacterial peritonitis 02/01/2018  . Hyperlipidemia 02/01/2018  . Nephrolithiasis 02/01/2018  . Pernicious anemia 02/01/2018  . Thyroid nodule 02/01/2018  . Carcinoma ([place]) 01/26/2018  . HTN, goal below 140/80 12/19/2017  . CKD (chronic kidney disease) stage 3, GFR 30-59 ml/min (HCC) 05/23/2017  . BCC (basal cell carcinoma), face 10/11/2015   PCP:  [name], MD Pharmacy:   [facility] [place], [place] - [place] AT [facility] [address] [place] [address] Phone: [phone] Fax: [phone]  [id number] [place], [place] - [address] AT [address] [place] [address] Phone: [phone] Fax: [phone]     Social Determinants of Health (SDOH) Interventions    Readmission Risk Interventions No flowsheet data found.

## 2018-08-22 NOTE — Telephone Encounter (Signed)
Patient discharging home form hospital tomorrow with hospice referral. Asking if Dr Grayland Ormond will sign orders and be attending. Please advse

## 2018-08-22 NOTE — Progress Notes (Addendum)
2 large blood clot passed with urine, hgb 7.7 MD Erlene Quan notified about NPO orders, received orders that sips with meds are ok Will continue to monitor, Stacie Glaze, RN

## 2018-08-22 NOTE — Progress Notes (Signed)
[place]  Telephone:([phone] [phone]   Name: Kristi Thompson Date: 08/22/2018 MRN: [id number]  DOB: February 09, 1924  Patient Care Team: [name], MD as PCP - General (Internal Medicine) Clent Jacks, RN as Registered Nurse Estevan Oaks, NP as Nurse Practitioner (Hospice and Palliative Medicine) Lloyd Huger, MD as Consulting Physician (Oncology) Daud Cayer, Kirt Boys, NP as Nurse Practitioner (Hospice and Palliative Medicine)    REASON FOR CONSULTATION: Palliative Care consult requested for [id number] y.o.femalewith multiple medical problems high-grade urothelial carcinoma previously treated on atezolizumab and switched to single agent gemcitabine due to lack of PDL1.Patient was admitted to the hospital on 08/20/18 with hematuria and weakness. Patient is being followed by urology. She was referred to palliative care to help address goals.   CODE STATUS: DNR  PAST MEDICAL HISTORY: Past Medical History:  Diagnosis Date   Asthma    Cancer ([place]) 1978   colon ca   COPD (chronic obstructive pulmonary disease) ([place])    GERD (gastroesophageal reflux disease)    History of hiatal hernia    Urothelial cancer ([place]) 2019   Urothelial carcinoma    PAST SURGICAL HISTORY:  Past Surgical History:  Procedure Laterality Date   ABDOMINAL HYSTERECTOMY     APPENDECTOMY     bowel obstruction     CATARACT EXTRACTION W/ INTRAOCULAR LENS  IMPLANT, BILATERAL     CHOLECYSTECTOMY     COLON SURGERY     colon cancer   COLONOSCOPY WITH PROPOFOL N/A 01/18/2018   Procedure: COLONOSCOPY WITH PROPOFOL;  Surgeon: Jonathon Bellows, MD;  Location: [facility] ENDOSCOPY;  Service: Gastroenterology;  Laterality: N/A;   CYSTOSCOPY W/ URETERAL STENT PLACEMENT Right 02/06/2018   Procedure: CYSTOSCOPY WITH RETROGRADE PYELOGRAM;  Surgeon: Hollice Espy, MD;  Location: [facility] ORS;  Service: Urology;  Laterality: Right;   CYSTOSCOPY  WITH URETEROSCOPY Right 02/06/2018   Procedure: CYSTOSCOPY WITH URETEROSCOPY;  Surgeon: Hollice Espy, MD;  Location: [facility] ORS;  Service: Urology;  Laterality: Right;   PORTA CATH INSERTION N/A 02/11/2018   Procedure: PORTA CATH INSERTION;  Surgeon: Algernon Huxley, MD;  Location: [facility];  Service: Cardiovascular;  Laterality: N/A;   THYROID SURGERY     nodule removed   THYROIDECTOMY     TONSILLECTOMY     URETERAL BIOPSY Right 02/06/2018   Procedure: URETERAL BIOPSY;  Surgeon: Hollice Espy, MD;  Location: [facility] ORS;  Service: Urology;  Laterality: Right;    HEMATOLOGY/ONCOLOGY HISTORY:  Oncology History  Urothelial carcinoma ([place])  02/12/2018 Initial Diagnosis   Urothelial carcinoma ([place])   02/14/2018 - 03/27/2018 Chemotherapy   The patient had atezolizumab (TECENTRIQ) 1,200 mg in sodium chloride 0.9 % 250 mL chemo infusion, 1,200 mg, Intravenous, Once, 2 of 6 cycles Administration: 1,200 mg (02/14/2018), 1,200 mg (03/07/2018)  for chemotherapy treatment.    03/28/2018 -  Chemotherapy   The patient had gemcitabine (GEMZAR) 1,300 mg in sodium chloride 0.9 % 250 mL chemo infusion, 1,292 mg (100 % of original dose 800 mg/m2), Intravenous,  Once, 5 of 8 cycles Dose modification: 800 mg/m2 (original dose 800 mg/m2, Cycle 1, Reason: Patient Age) Administration: 1,300 mg (03/28/2018), 1,300 mg (04/04/2018), 1,300 mg (04/18/2018), 1,300 mg (05/02/2018), 1,300 mg (05/16/2018), 1,300 mg (05/30/2018), 1,300 mg (06/13/2018), 1,300 mg (06/27/2018), 1,300 mg (07/11/2018)  for chemotherapy treatment.      ALLERGIES:  is allergic to demerol [meperidine] and sulfa antibiotics.  MEDICATIONS:  Current Facility-Administered Medications  Medication Dose Route Frequency Provider Last  Rate Last Dose   acetaminophen (TYLENOL) tablet 650 mg  650 mg Oral Q6H PRN Mansy, Jan A, MD       Or   acetaminophen (TYLENOL) suppository 650 mg  650 mg Rectal Q6H PRN Mansy, Jan A, MD       albuterol (PROVENTIL)  (2.5 MG/3ML) 0.083% nebulizer solution 3 mL  3 mL Inhalation Q4H PRN Mansy, Jan A, MD       budesonide (PULMICORT) nebulizer solution 0.25 mg  2 mL Inhalation BID Mansy, Jan A, MD   0.25 mg at 08/22/18 0753   docusate sodium (COLACE) capsule 100 mg  100 mg Oral BID Fritzi Mandes, MD   100 mg at 08/22/18 1006   famotidine (PEPCID) tablet 10 mg  10 mg Oral BID Mansy, Jan A, MD   10 mg at 08/22/18 1007   fentaNYL (DURAGESIC) 25 MCG/HR 1 patch  1 patch Transdermal Q72H Mansy, Jan A, MD   1 patch at 08/22/18 1006   fluticasone (FLONASE) 50 MCG/ACT nasal spray 2 spray  2 spray Each Nare QHS Mansy, Jan A, MD   2 spray at 08/21/18 2300   HYDROcodone-acetaminophen (NORCO/VICODIN) 5-325 MG per tablet 1 tablet  1 tablet Oral Q6H PRN Mansy, Jan A, MD       hydrocortisone (ANUSOL-HC) suppository 25 mg  25 mg Rectal BID Mansy, Jan A, MD   25 mg at 08/21/18 2332   magnesium hydroxide (MILK OF MAGNESIA) suspension 30 mL  30 mL Oral Daily PRN Mansy, Jan A, MD   30 mL at 08/21/18 2304   mirabegron ER (MYRBETRIQ) tablet 25 mg  25 mg Oral Daily Mansy, Jan A, MD   25 mg at 08/22/18 1006   montelukast (SINGULAIR) tablet 10 mg  10 mg Oral QHS Mansy, Jan A, MD   10 mg at 08/21/18 2302   multivitamin with minerals tablet 1 tablet  1 tablet Oral Daily Mansy, Jan A, MD   1 tablet at 08/22/18 1006   ondansetron (ZOFRAN) tablet 4 mg  4 mg Oral Q6H PRN Mansy, Jan A, MD       Or   ondansetron [facility]) injection 4 mg  4 mg Intravenous Q6H PRN Mansy, Jan A, MD       oxyCODONE (Oxy IR/ROXICODONE) immediate release tablet 10 mg  10 mg Oral Q4H PRN Mansy, Jan A, MD   10 mg at 08/22/18 1006   prochlorperazine (COMPAZINE) tablet 10 mg  10 mg Oral Q6H PRN Mansy, Jan A, MD       senna (SENOKOT) tablet 17.2 mg  2 tablet Oral Daily Fritzi Mandes, MD   17.2 mg at 08/22/18 1006   tamsulosin (FLOMAX) capsule 0.4 mg  0.4 mg Oral QPC supper Mansy, Jan A, MD   0.4 mg at 08/21/18 1643   traZODone (DESYREL) tablet 25 mg  25 mg Oral  QHS PRN Mansy, Jan A, MD       vitamin B-12 (CYANOCOBALAMIN) tablet 2,500 mcg  2,500 mcg Oral Daily Mansy, Jan A, MD   2,500 mcg at 08/22/18 1006    VITAL SIGNS: BP (!) 164/55 (BP Location: Left Arm)    Pulse 65    Temp 98.7 F (37.1 C) (Oral)    Resp 16    Ht '5\' 1"'  (1.549 m)    Wt 138 lb 0.1 oz (62.6 kg)    LMP  (LMP Unknown)    SpO2 98%    BMI 26.08 kg/m  Filed Weights   08/20/18 0121  Weight: 138 lb 0.1 oz (62.6 kg)    Estimated body mass index is 26.08 kg/m as calculated from the following:   Height as of this encounter: '5\' 1"'  (1.549 m).   Weight as of this encounter: 138 lb 0.1 oz (62.6 kg).  LABS: CBC:    Component Value Date/Time   WBC 7.5 08/21/2018 0538   HGB 7.7 (L) 08/21/2018 2311   HGB 12.9 04/30/2013 2116   HCT 18.5 (L) 08/21/2018 0538   HCT 39.1 04/30/2013 2116   PLT 140 (L) 08/21/2018 0538   PLT 229 04/30/2013 2116   MCV 106.9 (H) 08/21/2018 0538   MCV 93 04/30/2013 2116   NEUTROABS 7.5 08/20/2018 0127   LYMPHSABS 1.0 08/20/2018 0127   MONOABS 0.8 08/20/2018 0127   EOSABS 0.1 08/20/2018 0127   BASOSABS 0.1 08/20/2018 0127   Comprehensive Metabolic Panel:    Component Value Date/Time   NA 133 (L) 08/21/2018 0538   NA 140 04/30/2013 2116   K 4.2 08/21/2018 0538   K 3.8 04/30/2013 2116   CL 100 08/21/2018 0538   CL 107 04/30/2013 2116   CO2 26 08/21/2018 0538   CO2 29 04/30/2013 2116   BUN 35 (H) 08/21/2018 0538   BUN 25 (H) 04/30/2013 2116   CREATININE 2.19 (H) 08/21/2018 0538   CREATININE 1.01 04/30/2013 2116   GLUCOSE 99 08/21/2018 0538   GLUCOSE 126 (H) 04/30/2013 2116   CALCIUM 7.9 (L) 08/21/2018 0538   CALCIUM 8.9 04/30/2013 2116   AST 23 08/20/2018 0127   ALT 10 08/20/2018 0127   ALKPHOS 107 08/20/2018 0127   BILITOT 1.0 08/20/2018 0127   PROT 5.5 (L) 08/20/2018 0127   ALBUMIN 2.7 (L) 08/20/2018 0127    RADIOGRAPHIC STUDIES: Ct Abdomen Pelvis Wo Contrast  Result Date: 08/12/2018 CLINICAL DATA:  Metastatic right ureteral cancer  status post chemotherapy. Worsening weakness, fatigue and lower abdominal pain. Restaging. EXAM: CT ABDOMEN AND PELVIS WITHOUT CONTRAST TECHNIQUE: Multidetector CT imaging of the abdomen and pelvis was performed following the standard protocol without IV contrast. COMPARISON:  07/09/2018 PET-CT.  01/18/2018 CT abdomen/pelvis. FINDINGS: Lower chest: Small dependent bilateral pleural effusions with dependent bibasilar atelectasis, mildly increased bilaterally. Coronary atherosclerosis. Hepatobiliary: Normal liver size. No liver mass. Cholecystectomy. No biliary ductal dilatation. Pancreas: Normal, with no mass or duct dilation. Spleen: Normal size. No mass. Adrenals/Urinary Tract: Normal adrenals. No appreciable change in severe chronic right hydroureteronephrosis to the level of the lower lumbar ureter. Stable asymmetric severe atrophy of the right renal parenchyma. No left hydronephrosis. No contour deforming renal masses. No renal stones. Irregular infiltrative solid right pelvic periureteric mass measures 5.8 x 5.1 cm at the level of the upper right pelvis (series 2/image 55), increased from 4.8 x 4.3 cm on 07/09/2018 PET-CT, and measures 5.7 x 5.0 cm at the level of the right ureterovesical junction (series 2/image 63), stable from 5.7 x 5.0 cm. No acute bladder abnormality. Stomach/Bowel: Normal non-distended stomach. Normal caliber small bowel with no small bowel wall thickening. Appendectomy. Oral contrast transits to the colon. Eccentric segmental wall thickening in the mid transverse colon (series 2/image 27) has not appreciably changed. No new sites of large bowel wall thickening. Moderate colorectal stool volume. Vascular/Lymphatic: Atherosclerotic nonaneurysmal abdominal aorta. Mildly enlarged 1.0 cm right posterior paracaval node (series 2/image 26), increased from 0.7 cm. No additional new pathologically enlarged abdominopelvic nodes. Reproductive: Hysterectomy.  No new adnexal masses. Other: No  pneumoperitoneum or focal fluid collection. Small volume left lower quadrant ascites is mildly  increased. Stable mild presacral space edema. Anasarca is increased. Musculoskeletal: No aggressive appearing focal osseous lesions. Moderate lumbar spondylosis. IMPRESSION: 1. Mild right posterior paracaval adenopathy, mildly increased. Irregular infiltrative periureteric neoplasm at the level of the upper right pelvis is increased. Findings suggest mild tumor progression. 2. Eccentric segmental wall thickening in the mid transverse colon appears unchanged. 3. Small dependent bilateral pleural effusions, mildly increased bilaterally. Small volume left lower quadrant ascites is mildly increased. Anasarca is increased. 4. Chronic right hydroureteronephrosis and right renal atrophy is unchanged. 5.  Aortic Atherosclerosis (ICD10-I70.0). Electronically Signed   By: Ilona Sorrel M.D.   On: 08/12/2018 15:48   Mr Brain Wo Contrast  Result Date: 07/30/2018 CLINICAL DATA:  Slurred speech, generalized weakness, and confusion. History of urothelial carcinoma. No IV contrast administered due to diminished renal function. EXAM: MRI HEAD WITHOUT CONTRAST TECHNIQUE: Multiplanar, multiecho pulse sequences of the brain and surrounding structures were obtained without intravenous contrast. COMPARISON:  Head CT 02/22/2015 FINDINGS: Brain: There is no evidence of acute infarct, intracranial hemorrhage, mass, midline shift, or extra-axial fluid collection. Periventricular and deep cerebral white matter T2/FLAIR hyperintensities are nonspecific but compatible with minimal chronic small vessel ischemic disease. The ventricles and sulci are within normal limits for age. Vascular: Major intracranial vascular flow voids are preserved. Skull and upper cervical spine: No suspicious marrow lesion. Sinuses/Orbits: Bilateral cataract extraction. Mild right maxillary sinus mucosal thickening. Clear mastoid air cells. Other: 1.7 cm T2 hyperintense  subcutaneous lesion in the left face, likely a sebaceous cyst. IMPRESSION: 1. No acute intracranial abnormality. 2. Minimal chronic small vessel ischemic disease. Electronically Signed   By: Logan Bores M.D.   On: 07/30/2018 18:03   US Renal  Result Date: 08/21/2018 CLINICAL DATA:  Gross hematuria. EXAM: RENAL / URINARY TRACT ULTRASOUND COMPLETE COMPARISON:  CT scan August 12, 2018 FINDINGS: Right Kidney: Renal measurements: 7.6 x 3.7 x 3.2 cm = volume: 45.9 mL. Severe hydronephrosis remains. Left Kidney: Renal measurements: 10.0 x 5.3 x 4.0 cm = volume: 111 mL. Echogenicity within normal limits. No mass or hydronephrosis visualized. Bladder: The patient has known bladder mass remains measuring 9.5 x 4.7 x 8.1 cm today. IMPRESSION: 1. Persistent severe right hydronephrosis. 2. Known right bladder mass measuring 9.5 x 4.7 x 8.1 cm today. Electronically Signed   By: Dorise Bullion III M.D   On: 08/21/2018 14:55   US Venous Img Lower Unilateral Right  Result Date: 07/25/2018 CLINICAL DATA:  Right lower extremity edema. History of bladder cancer. Evaluate for DVT. EXAM: RIGHT LOWER EXTREMITY VENOUS DOPPLER ULTRASOUND TECHNIQUE: Gray-scale sonography with graded compression, as well as color Doppler and duplex ultrasound were performed to evaluate the lower extremity deep venous systems from the level of the common femoral vein and including the common femoral, femoral, profunda femoral, popliteal and calf veins including the posterior tibial, peroneal and gastrocnemius veins when visible. The superficial great saphenous vein was also interrogated. Spectral Doppler was utilized to evaluate flow at rest and with distal augmentation maneuvers in the common femoral, femoral and popliteal veins. COMPARISON:  Right lower extremity venous Doppler ultrasound-04/11/2018; 07/24/2014 FINDINGS: Contralateral Common Femoral Vein: Respiratory phasicity is normal and symmetric with the symptomatic side. No evidence of thrombus.  Normal compressibility. Common Femoral Vein: There is hypoechoic nonocclusive DVT within the distal aspect of the right femoral vein (representative images 8 and 11). Saphenofemoral Junction: No evidence of thrombus. Normal compressibility and flow on color Doppler imaging. Profunda Femoral Vein: There is hypoechoic occlusive thrombus within the  interrogated portions the right deep femoral vein (image 18). Femoral Vein: There is hypoechoic nonocclusive thrombus within the proximal (image 20) and mid (image 23) aspects of the right femoral vein. The distal aspect the right femoral vein appears patent. Popliteal Vein: No evidence of thrombus. Normal compressibility, respiratory phasicity and response to augmentation. Calf Veins: No evidence of thrombus. Normal compressibility and flow on color Doppler imaging. Superficial Great Saphenous Vein: No evidence of thrombus. Normal compressibility. Venous Reflux:  None. Other Findings: Moderate amount of subcutaneous edema is noted at the level of the right lower leg and calf. IMPRESSION: Examination is positive for occlusive DVT involving the right deep femoral vein. Nonocclusive thrombus is also seen within the right common femoral vein as well as the proximal and mid aspects of the right superficial femoral vein. Electronically Signed   By: Sandi Mariscal M.D.   On: 07/25/2018 16:04    PERFORMANCE STATUS (ECOG) : 3 - Symptomatic, >50% confined to bed  Review of Systems Unless otherwise noted, a complete review of systems is negative.  Physical Exam General: NAD, frail appearing, thin Pulmonary: clear ant fields GU: yellow urine Foley Extremities: no edema, no joint deformities Skin: no rashes Neurological: Weakness but otherwise nonfocal  IMPRESSION: Patient is s/p cystoscopy and clot evacuation. Patient seems to be doing better without current hematuria.   She is comfortable appearing without any pain.   Patient remains pleasantly confused.   I called  and updated her daughter. Plan is for patient to return home with hospice when medically ready.   PLAN: -Best supportive care -Home with hospice when medically ready     Time Total: 15 minutes  Visit consisted of counseling and education dealing with the complex and emotionally intense issues of symptom management and palliative care in the setting of serious and potentially life-threatening illness.Greater than 50%  of this time was spent counseling and coordinating care related to the above assessment and plan.  Signed by: Altha Harm, PhD, NP-C [phone] (Work Cell)

## 2018-08-22 NOTE — Op Note (Signed)
Date of procedure: 08/22/18  Preoperative diagnosis:  1. History of upper tract urothelial carcinoma 2. Gross hematuria  Postoperative diagnosis:  1. Same as above  Procedure: 1. Cystoscopy 2. Clot evacuation 3. Fulguration of bleeding blood vessels  Surgeon: Hollice Espy, MD  Anesthesia: General  Complications: None  Intraoperative findings: 100 cc of clot evacuated from bladder, old appearing but not particularly well organized.  Infiltrative masslike appearance of right hemitrigone with some friable mucosa.  Slight amount of oozing from this area.  Oozing fulgurated.  UO not visible.  Excellent hemostasis at the end of the procedure.  EBL: 100 cc of old clot  Specimens: None  Drains: 43 French three-way Foley catheter, 30 cc in the balloon  Indication: Kristi Thompson is a 85 y.o. patient with metastatic right upper tract urothelial carcinoma with acute obstruction of the right ureter presenting to the ER with acute blood loss anemia secondary to gross hematuria.  Renal bladder ultrasound showed significant clot clot burden in the bladder.  After reviewing the management options for treatment, she elected to proceed with the above surgical procedure(s). We have discussed the potential benefits and risks of the procedure, side effects of the proposed treatment, the likelihood of the patient achieving the goals of the procedure, and any potential problems that might occur during the procedure or recuperation. Informed consent has been obtained.  Description of procedure:  The patient was taken to the operating room and general anesthesia was induced.  The patient was placed in the dorsal lithotomy position, prepped and draped in the usual sterile fashion, and preoperative antibiotics were administered. A preoperative time-out was performed.   A 21 French scope was advanced per urethra into the bladder.  Immediately was apparent that it was noncommittal to visualize anything  without continuous irrigation.  As such, the scope was exchanged for 26 French resectoscope.  I then began to evacuate the bladder of clot using a [place] syringe.  I was able to successfully evacuate the majority of the clot, approximately 100 cc of fairly old-looking clot but not particularly well organized.  At this point in time using the visual obturator, a was able to examine the bladder and a few small residual clots were evacuated.  There was a infiltrative type appearance of friable abnormal mucosa involving the entire right hemitrigone the UO itself was not readily visible.  This area was heaped up.  This was consistent with mass-effect and infiltration of her bladder tumor.  Using the Bugbee electrocautery, I fulgurated the entire surface of this area until fairly good hemostasis was achieved.  The remainder the bladder was unremarkable.  The left UO appeared to be normal with good reflux of urine.  Finally, the scope was removed and a 20 [place] three-way hematuria catheter was placed.  The balloon was filled with 30 cc of sterile water.  The urine remained relatively light pink without initiating the CBI so left the CBI clamped off at this point in time.  The patient was a clean and dry, repositioned in supine position, version anesthesia, and taken to the PACU in stable condition  Postoperatively, I was able to talk to her daughter [name] by telephone and updated her on the findings.  Plan: - Plan to leave a hematuria catheter in overnight, flush as needed and initiate CBI if necessary to keep urine light pink -Ideally, we can leave CBI off overnight and if urine remains relatively clear, we will can remove the catheter in the morning -Continue to  monitor hemoglobin with supportive care -Discussed today with daughter, she would like her to come home on hospice ideally in the near future  Hollice Espy, M.D.

## 2018-08-22 NOTE — Progress Notes (Signed)
D/C hemoglobin checks per Dr. Posey Pronto.  Clarise Cruz, BSN

## 2018-08-22 NOTE — Progress Notes (Signed)
New referral for [facility] hospice services at home received from [place]. Patient is an 85 year old woman with a known history of bladder cancer admitted to [facility] on 6/9 with hematuria.she has been receiving chemo at the cancer center, last treatment per chart note review was 6 weeks ago. Hemoglobin on admission was 8.8, it dropped to 6.0 on 6/10, she has received 2 units of blood. She has a history of right lower extremity DVT and was on eliquis at home, this has been discontinued. Urology, Oncology and Palliative Medicine have been consulted. Patient is no longer a candidate for any further chemo treatment. Focus to be turned towards comfort and symptom management with the support of hospice services at home.  Writer spoke via telephone to patient's daughter Jiali Linney to initiate education regarding hospice services, philosophy and team approach to care with understanding voiced. She will require a hospital bed to be in place prior to discharge and EMS transport at discharge. Mrs. Sabra Heck did express her concerns regarding her ability to care for her mother, she has had a considerable decline in functional status and she would be the sole caregiver. Discussed hospice home criteria with Mrs. Loney Hering [facility] [place] medical director regarding patient's current status. At this time she is not appropriate for the hospice home, writer did explain that if patient returns home the hospice home can still be an option if her symptoms are not controlled at home, she voiced understanding. Hospital bed has been ordered for delivery tomorrow. Patient is currently in the OR for planned cystoscopy for clot retrieval. Patient information has been sent to referral. Hospital care team updated. Will continue to follow through discharge. Flo Shanks BSN, RN, [facility] [place] [facility] [phone]

## 2018-08-23 ENCOUNTER — Encounter: Payer: Self-pay | Admitting: Urology

## 2018-08-23 DIAGNOSIS — R310 Gross hematuria: Secondary | ICD-10-CM

## 2018-08-23 MED ORDER — MULTI-VITAMINS PO TABS: 1.0000 | ORAL_TABLET | Freq: Every day | ORAL | 0 refills | Status: AC

## 2018-08-23 MED ORDER — HEPARIN SOD (PORK) LOCK FLUSH 100 UNIT/ML IV SOLN
500.0000 [IU] | INTRAVENOUS | Status: AC | PRN
  Administered 2018-08-23: 500 [IU]
  Filled 2018-08-23: qty 5

## 2018-08-23 MED ORDER — SODIUM CHLORIDE 0.9% FLUSH: 10.0000 mL | INTRAVENOUS | Status: DC | PRN

## 2018-08-23 MED ORDER — SENNA 8.6 MG PO TABS: 2.0000 | ORAL_TABLET | Freq: Every day | ORAL | 0 refills | Status: AC

## 2018-08-23 NOTE — Discharge Summary (Addendum)
[facility] NAME: Kristi Thompson    MR#:  [id number]  DATE OF BIRTH:  01-15-1924  DATE OF ADMISSION:  08/20/2018 ADMITTING PHYSICIAN: Christel Mormon, MD  DATE OF DISCHARGE: 08/23/2018  PRIMARY CARE PHYSICIAN: [name], MD    ADMISSION DIAGNOSIS:  Gross hematuria [R31.0] Acute on chronic blood loss anemia [D62]  DISCHARGE DIAGNOSIS:  gross hematuria secondary to known h/o high-grade uroepithelial carcinoma acute on chronic anemia secondary to hematuria status post blood transfusion  SECONDARY DIAGNOSIS:   Past Medical History:  Diagnosis Date  . Asthma   . Cancer ([place]) 1978   colon ca  . COPD (chronic obstructive pulmonary disease) ([place])   . GERD (gastroesophageal reflux disease)   . History of hiatal hernia   . Urothelial cancer ([place]) 2019   Urothelial carcinoma    HOSPITAL COURSE:  [name] a89 y.o.Caucasian femalewith a known history of multiple medical problems t including urothelial cancer, presented to the emergency room with onset of hematuria this morning with associated dysuria. While in the ER her hemoglobin dropped from 8.8-8.1  1. GrossHematuria with subsequent acute blood loss anemia in the setting of known  urothelial carcinoma -urology consultation with Dr Erlene Quan noted--s/p clot evacuation 08/22/2018 -patient's Eliquis will be discontinued -8.8--8.1--6.0--1 unit BT--8.4--7.7 -Dr Jolee Ewing  Borders input appreciated--dter is agreeable with home and hospice . Understands poor prognosis -Post procedure patient had CBI for several hours and urine is clear at present. foley catheter removed.  2. H/o DVT -d/ced eliquis  3. Asthma.  continue her Pulmicort and albuterol. She has no current exacerbation.  4. GERD. H2 blocker therapy will be continued.  5. DVT prophylaxis.SCDs. -her Eliquis will discontinued given her hematuria.  6. Acute on CKD-III with right sided  severe hydronephrosis due to Uroepithelial cancer -US renal ordered shows severe right hydronephrosis and Clot retention -creat 2.12 -baseline creat 1.5  D/w [name] on the phone--she does understand poor prognosis about her mom  D/c home later today with Home hospice. Pt is DNR CONSULTS OBTAINED:  Treatment Team:  Hollice Espy, MD Abbie Sons, MD Lloyd Huger, MD  DRUG ALLERGIES:   Allergies  Allergen Reactions  . Demerol [Meperidine] Swelling  . Sulfa Antibiotics Swelling    DISCHARGE MEDICATIONS:   Allergies as of 08/23/2018      Reactions   Demerol [meperidine] Swelling   Sulfa Antibiotics Swelling      Medication List    STOP taking these medications   apixaban 5 MG Tabs tablet Commonly known as: Eliquis   Eliquis DVT/PE Starter Pack 5 MG Tabs     TAKE these medications   acetaminophen 325 MG tablet Commonly known as: TYLENOL Take 325 mg by mouth every 6 (six) hours as needed.   Cyanocobalamin 5000 MCG Subl Place 5,000 mcg under the tongue daily.   famotidine 40 MG tablet Commonly known as: PEPCID Take 40 mg by mouth at bedtime.   fentaNYL 25 MCG/HR Commonly known as: [place] 1 patch onto the skin every 3 (three) days.   fluticasone 50 MCG/ACT nasal spray Commonly known as: FLONASE USE 2 SPRAYS INTO BOTH     NOSTRILS NIGHTLY   magnesium hydroxide 400 MG/5ML suspension Commonly known as: MILK OF MAGNESIA Take 30 mLs by mouth daily as needed for mild constipation.   montelukast 10 MG tablet Commonly known as: SINGULAIR TAKE 1 TABLET DAILY   Multi-Vitamins Tabs Take 1 tablet by mouth daily. What  changed:   how much to take  when to take this   Oxycodone HCl 10 MG Tabs Take 1 tablet (10 mg total) by mouth every 4 (four) hours as needed.   prochlorperazine 10 MG tablet Commonly known as: COMPAZINE Take 1 tablet (10 mg total) by mouth every 6 (six) hours as needed (Nausea or vomiting).   Pulmicort Flexhaler  180 MCG/ACT inhaler Generic drug: budesonide Inhale 1 puff into the lungs daily.   senna 8.6 MG Tabs tablet Commonly known as: SENOKOT Take 2 tablets (17.2 mg total) by mouth daily.       If you experience worsening of your admission symptoms, develop shortness of breath, life threatening emergency, suicidal or homicidal thoughts you must seek medical attention immediately by calling 911 or calling your MD immediately  if symptoms less severe.  You Must read complete instructions/literature along with all the possible adverse reactions/side effects for all the Medicines you take and that have been prescribed to you. Take any new Medicines after you have completely understood and accept all the possible adverse reactions/side effects.   Please note  You were cared for by a hospitalist during your hospital stay. If you have any questions about your discharge medications or the care you received while you were in the hospital after you are discharged, you can call the unit and asked to speak with the hospitalist on call if the hospitalist that took care of you is not available. Once you are discharged, your primary care physician will handle any further medical issues. Please note that NO REFILLS for any discharge medications will be authorized once you are discharged, as it is imperative that you return to your primary care physician (or establish a relationship with a primary care physician if you do not have one) for your aftercare needs so that they can reassess your need for medications and monitor your lab values. Today   SUBJECTIVE   Now new complaints. Tolerated CBI well. Urine clear.  Oriented well  Eager to go home  VITAL SIGNS:  Blood pressure (!) 156/56, pulse 97, temperature (!) 97.5 F (36.4 C), temperature source Oral, resp. rate 16, height 5\' 1"  (1.549 m), weight 62.6 kg, SpO2 98 %.  I/O:    Intake/Output Summary (Last 24 hours) at 08/23/2018 0958 Last data filed at  08/23/2018 0448 Gross per 24 hour  Intake 800 ml  Output 1700 ml  Net -900 ml    PHYSICAL EXAMINATION:  GENERAL:  85 y.o.-year-old patient lying in the bed with no acute distress.  EYES: Pupils equal, round, reactive to light and accommodation. No scleral icterus. Extraocular muscles intact. Pallor+ HEENT: Head atraumatic, normocephalic. Oropharynx and nasopharynx clear.  NECK:  Supple, no jugular venous distention. No thyroid enlargement, no tenderness.  LUNGS: Normal breath sounds bilaterally, no wheezing, rales,rhonchi or crepitation. No use of accessory muscles of respiration.  CARDIOVASCULAR: S1, S2 normal. No murmurs, rubs, or gallops.  ABDOMEN: Soft, non-tender, non-distended. Bowel sounds present. No organomegaly or mass.  EXTREMITIES: No pedal edema, cyanosis, or clubbing.  NEUROLOGIC: Cranial nerves II through XII are intact. Muscle strength 5/5 in all extremities. Sensation intact. Gait not checked. weak PSYCHIATRIC: The patient is alert and oriented x 3.  SKIN: No obvious rash, lesion, or ulcer.   DATA REVIEW:   CBC  Recent Labs  Lab 08/21/18 0538  08/21/18 2311  WBC 7.5  --   --   HGB 6.0*   < > 7.7*  HCT 18.5*  --   --  PLT 140*  --   --    < > = values in this interval not displayed.    Chemistries  Recent Labs  Lab 08/20/18 0127 08/21/18 0538  NA 135 133*  K 3.8 4.2  CL 101 100  CO2 28 26  GLUCOSE 124* 99  BUN 25* 35*  CREATININE 1.44* 2.19*  CALCIUM 8.2* 7.9*  AST 23  --   ALT 10  --   ALKPHOS 107  --   BILITOT 1.0  --     Microbiology Results   Recent Results (from the past 240 hour(s))  Urine Culture     Status: None   Collection Time: 08/20/18  4:20 AM   Specimen: Urine, Random  Result Value Ref Range Status   Specimen Description   Final    URINE, RANDOM Performed at [facility], [address] [place], [place] [address]    Special Requests   Final    NONE Performed at [facility], [address]  [place], [place] [address]    Culture   Final    NO GROWTH Performed at [facility], [place] [address] [place], [place] [address]    Report Status 08/21/2018 FINAL  Final  SARS Coronavirus 2 (CEPHEID - Performed in [place] hospital lab), Hosp Order     Status: None   Collection Time: 08/20/18  6:28 AM   Specimen: Nasopharyngeal Swab  Result Value Ref Range Status   SARS Coronavirus 2 NEGATIVE NEGATIVE Final    Comment: (NOTE) If result is NEGATIVE SARS-CoV-2 target nucleic acids are NOT DETECTED. The SARS-CoV-2 RNA is generally detectable in upper and lower  respiratory specimens during the acute phase of infection. The lowest  concentration of SARS-CoV-2 viral copies this assay can detect is 250  copies / mL. A negative result does not preclude SARS-CoV-2 infection  and should not be used as the sole basis for treatment or other  patient management decisions.  A negative result may occur with  improper specimen collection / handling, submission of specimen other  than nasopharyngeal swab, presence of viral mutation(s) within the  areas targeted by this assay, and inadequate number of viral copies  (<250 copies / mL). A negative result must be combined with clinical  observations, patient history, and epidemiological information. If result is POSITIVE SARS-CoV-2 target nucleic acids are DETECTED. The SARS-CoV-2 RNA is generally detectable in upper and lower  respiratory specimens dur ing the acute phase of infection.  Positive  results are indicative of active infection with SARS-CoV-2.  Clinical  correlation with patient history and other diagnostic information is  necessary to determine patient infection status.  Positive results do  not rule out bacterial infection or co-infection with other viruses. If result is PRESUMPTIVE POSTIVE SARS-CoV-2 nucleic acids MAY BE PRESENT.   A presumptive positive result was obtained on the submitted specimen  and confirmed on repeat  testing.  While 2019 novel coronavirus  (SARS-CoV-2) nucleic acids may be present in the submitted sample  additional confirmatory testing may be necessary for epidemiological  and / or clinical management purposes  to differentiate between  SARS-CoV-2 and other Sarbecovirus currently known to infect humans.  If clinically indicated additional testing with an alternate test  methodology [phone]) is advised. The SARS-CoV-2 RNA is generally  detectable in upper and lower respiratory sp ecimens during the acute  phase of infection. The expected result is Negative. Fact Sheet for Patients:  StrictlyIdeas.no Fact Sheet for Healthcare  Providers: BankingDealers.co.za This test is not yet approved or cleared by the [place] and has been authorized for detection and/or diagnosis of SARS-CoV-2 by FDA under an Emergency Use Authorization (EUA).  This EUA will remain in effect (meaning this test can be used) for the duration of the COVID-19 declaration under Section 564(b)(1) of the Act, 21 U.S.C. section 360bbb-3(b)(1), unless the authorization is terminated or revoked sooner. Performed at [facility], [address] [place], [place] [address]     RADIOLOGY:  US Renal  Result Date: 08/21/2018 CLINICAL DATA:  Gross hematuria. EXAM: RENAL / URINARY TRACT ULTRASOUND COMPLETE COMPARISON:  CT scan August 12, 2018 FINDINGS: Right Kidney: Renal measurements: 7.6 x 3.7 x 3.2 cm = volume: 45.9 mL. Severe hydronephrosis remains. Left Kidney: Renal measurements: 10.0 x 5.3 x 4.0 cm = volume: 111 mL. Echogenicity within normal limits. No mass or hydronephrosis visualized. Bladder: The patient has known bladder mass remains measuring 9.5 x 4.7 x 8.1 cm today. IMPRESSION: 1. Persistent severe right hydronephrosis. 2. Known right bladder mass measuring 9.5 x 4.7 x 8.1 cm today. Electronically Signed   By: Dorise Bullion III M.D   On: 08/21/2018  14:55     CODE STATUS:     Code Status Orders  (From admission, onward)         Start     Ordered   08/21/18 1711  Do not attempt resuscitation (DNR)  Continuous    Question Answer Comment  In the event of cardiac or respiratory ARREST Do not call a "code blue"   In the event of cardiac or respiratory ARREST Do not perform Intubation, CPR, defibrillation or ACLS   In the event of cardiac or respiratory ARREST Use medication by any route, position, wound care, and other measures to relive pain and suffering. May use oxygen, suction and manual treatment of airway obstruction as needed for comfort.      08/21/18 1710        Code Status History    Date Active Date Inactive Code Status Order ID Comments User Context   08/20/2018 0631 08/21/2018 1710 Full Code [id number]  Mansy, Arvella Merles, MD ED   Advance Care Planning Activity    Advance Directive Documentation     Most Recent Value  Type of Advance Directive  Healthcare Power of Attorney, Living will  Pre-existing out of facility DNR order (yellow form or pink MOST form)  -  "MOST" Form in Place?  -      TOTAL TIME TAKING CARE OF THIS PATIENT: *40* minutes.    Fritzi Mandes M.D on 08/23/2018 at 9:58 AM  Between 7am to 6pm - Pager - [phone] After 6pm go to [email or web address] - password [email or web address]  Office  [phone]  [name]; [name], MD

## 2018-08-23 NOTE — Anesthesia Postprocedure Evaluation (Signed)
Anesthesia Post Note  Patient: Kristi Thompson  Procedure(s) Performed: CYSTOSCOPY WITH FULGERATION (N/A )  Patient location during evaluation: PACU Anesthesia Type: General Level of consciousness: awake and alert Pain management: pain level controlled Vital Signs Assessment: post-procedure vital signs reviewed and stable Respiratory status: spontaneous breathing, nonlabored ventilation and respiratory function stable Cardiovascular status: blood pressure returned to baseline and stable Postop Assessment: no apparent nausea or vomiting Anesthetic complications: no     Last Vitals:  Vitals:   08/22/18 2041 08/23/18 0445  BP:  (!) 156/56  Pulse:  97  Resp:  16  Temp:  (!) 36.4 C  SpO2: 98% 98%    Last Pain:  Vitals:   08/23/18 0541  TempSrc:   PainSc: [place]

## 2018-08-23 NOTE — Progress Notes (Signed)
Visit made to new referral for [facility] services at home. Patient seen sitting up in bed, alert, but disoriented, asking where she was and why she was there. Attempted to gently reorient patient. She denied pain, had received PRN oxycodone prior to visit when for removal of foley catheter. Patient requested to talk with her daughter, Probation officer used the room phone to contact patient's daughter Kristi Thompson. Patient was able to recall her daughter's phone number. They did speak, patient appeared calmer. Palliative NP Josh Border's in during the visit and patient recognized home and called him by name. Per report of staff RN Stanton Kidney, patient has not had any further hematuria. She will dishcarge home today via EMS when DME has been delivered. Daughter Malyssa to call Staff RN Stanton Kidney when DME is in place. Patient will transport via EMS with signed out of facility DNR in place. Discharge summary faxed to referral. Hospital care team updated. Flo Shanks BSN, RN, [facility] [phone]

## 2018-08-23 NOTE — Progress Notes (Signed)
Pt is d/ced home with hospice.  Removed port.  Pt will transport home w/EMS.  Spoke to pt's daughter Irania Durell.  Reviewed medications with daughter and answered questions.

## 2018-08-23 NOTE — Care Management Important Message (Signed)
Important Message  Patient Details  Name: Kristi Thompson MRN: [id number] Date of Birth: 1923/05/23   Medicare Important Message Given:  Yes    [name] 08/23/2018, 10:04 AM

## 2018-08-23 NOTE — TOC Transition Note (Signed)
Transition of Care [facility]) - CM/SW Discharge Note   Patient Details  Name: Kristi Thompson MRN: [id number] Date of Birth: 07-12-1923  Transition of Care [facility]) CM/SW Contact:  Annamaria Boots, [place] Phone Number: 08/23/2018, 1:28 PM   Clinical Narrative:   Patient will discharge home today with hospice services through [facility]. Family is aware of discharge today. Patient will be transported by EMS. RN to call for transport.     Final next level of care: Home w Hospice Care Barriers to Discharge: No Barriers Identified   Patient Goals and CMS Choice   CMS Medicare.gov Compare Post Acute Care list provided to:: Patient Represenative (must comment)(Daughter) Choice offered to / list presented to : Adult Children  Discharge Placement                    Patient and family notified of of transfer: 08/23/18  Discharge Plan and Services     Post Acute Care Choice: Hospice                               Social Determinants of Health (SDOH) Interventions     Readmission Risk Interventions No flowsheet data found.

## 2018-08-23 NOTE — Progress Notes (Signed)
[place]  [phone] [phone]   Name: Kristi Thompson Date: 08/23/2018 MRN: [id number]  DOB: December 10, 1923  Patient Care Team: [name], MD as PCP - General (Internal Medicine) Clent Jacks, RN as Registered Nurse Estevan Oaks, NP as Nurse Practitioner (Hospice and Palliative Medicine) Lloyd Huger, MD as Consulting Physician (Oncology) Amadi Frady, Kirt Boys, NP as Nurse Practitioner (Hospice and Palliative Medicine)    REASON FOR CONSULTATION: Palliative Care consult requested for [phone] y.o.femalewith multiple medical problems high-grade urothelial carcinoma previously treated on atezolizumab and switched to single agent gemcitabine due to lack of PDL1.Patient was admitted to the hospital on 08/20/18 with hematuria and weakness. Patient is being followed by urology. She was referred to palliative care to help address goals.   CODE STATUS: DNR  PAST MEDICAL HISTORY: Past Medical History:  Diagnosis Date   Asthma    Cancer ([place]) 1978   colon ca   COPD (chronic obstructive pulmonary disease) ([place])    GERD (gastroesophageal reflux disease)    History of hiatal hernia    Urothelial cancer ([place]) 2019   Urothelial carcinoma    PAST SURGICAL HISTORY:  Past Surgical History:  Procedure Laterality Date   ABDOMINAL HYSTERECTOMY     APPENDECTOMY     bowel obstruction     CATARACT EXTRACTION W/ INTRAOCULAR LENS  IMPLANT, BILATERAL     CHOLECYSTECTOMY     COLON SURGERY     colon cancer   COLONOSCOPY WITH PROPOFOL N/A 01/18/2018   Procedure: COLONOSCOPY WITH PROPOFOL;  Surgeon: Jonathon Bellows, MD;  Location: [facility] ENDOSCOPY;  Service: Gastroenterology;  Laterality: N/A;   CYSTOSCOPY W/ URETERAL STENT PLACEMENT Right 02/06/2018   Procedure: CYSTOSCOPY WITH RETROGRADE PYELOGRAM;  Surgeon: Hollice Espy, MD;  Location: [facility] ORS;  Service: Urology;  Laterality: Right;   CYSTOSCOPY  WITH FULGERATION N/A 08/22/2018   Procedure: CYSTOSCOPY WITH FULGERATION;  Surgeon: Hollice Espy, MD;  Location: [facility] ORS;  Service: Urology;  Laterality: N/A;   CYSTOSCOPY WITH URETEROSCOPY Right 02/06/2018   Procedure: CYSTOSCOPY WITH URETEROSCOPY;  Surgeon: Hollice Espy, MD;  Location: [facility] ORS;  Service: Urology;  Laterality: Right;   PORTA CATH INSERTION N/A 02/11/2018   Procedure: PORTA CATH INSERTION;  Surgeon: Algernon Huxley, MD;  Location: [facility] CV LAB;  Service: Cardiovascular;  Laterality: N/A;   THYROID SURGERY     nodule removed   THYROIDECTOMY     TONSILLECTOMY     URETERAL BIOPSY Right 02/06/2018   Procedure: URETERAL BIOPSY;  Surgeon: Hollice Espy, MD;  Location: [facility] ORS;  Service: Urology;  Laterality: Right;    HEMATOLOGY/ONCOLOGY HISTORY:  Oncology History  Urothelial carcinoma ([place])  02/12/2018 Initial Diagnosis   Urothelial carcinoma ([place])   02/14/2018 - 03/27/2018 Chemotherapy   The patient had atezolizumab (TECENTRIQ) 1,200 mg in sodium chloride 0.9 % 250 mL chemo infusion, 1,200 mg, Intravenous, Once, 2 of 6 cycles Administration: 1,200 mg (02/14/2018), 1,200 mg (03/07/2018)  for chemotherapy treatment.    03/28/2018 -  Chemotherapy   The patient had gemcitabine (GEMZAR) 1,300 mg in sodium chloride 0.9 % 250 mL chemo infusion, 1,292 mg (100 % of original dose 800 mg/m2), Intravenous,  Once, 5 of 8 cycles Dose modification: 800 mg/m2 (original dose 800 mg/m2, Cycle 1, Reason: Patient Age) Administration: 1,300 mg (03/28/2018), 1,300 mg (04/04/2018), 1,300 mg (04/18/2018), 1,300 mg (05/02/2018), 1,300 mg (05/16/2018), 1,300 mg (05/30/2018), 1,300 mg (06/13/2018), 1,300 mg (06/27/2018), 1,300 mg (07/11/2018)  for chemotherapy treatment.  ALLERGIES:  is allergic to demerol [meperidine] and sulfa antibiotics.  MEDICATIONS:  Current Facility-Administered Medications  Medication Dose Route Frequency Provider Last Rate Last Dose   acetaminophen (TYLENOL)  tablet 650 mg  650 mg Oral Q6H PRN Mansy, Jan A, MD       Or   acetaminophen (TYLENOL) suppository 650 mg  650 mg Rectal Q6H PRN Mansy, Jan A, MD       albuterol (PROVENTIL) (2.5 MG/3ML) 0.083% nebulizer solution 3 mL  3 mL Inhalation Q4H PRN Mansy, Jan A, MD       budesonide (PULMICORT) nebulizer solution 0.25 mg  2 mL Inhalation BID Mansy, Jan A, MD   0.25 mg at 08/23/18 0746   docusate sodium (COLACE) capsule 100 mg  100 mg Oral BID Fritzi Mandes, MD   100 mg at 08/23/18 0912   famotidine (PEPCID) tablet 10 mg  10 mg Oral BID Mansy, Jan A, MD   10 mg at 08/23/18 0910   fentaNYL (DURAGESIC) 25 MCG/HR 1 patch  1 patch Transdermal Q72H Mansy, Jan A, MD   1 patch at 08/22/18 1006   fluticasone (FLONASE) 50 MCG/ACT nasal spray 2 spray  2 spray Each Nare QHS Mansy, Jan A, MD   2 spray at 08/22/18 2021   HYDROcodone-acetaminophen (NORCO/VICODIN) 5-325 MG per tablet 1 tablet  1 tablet Oral Q6H PRN Mansy, Jan A, MD       hydrocortisone (ANUSOL-HC) suppository 25 mg  25 mg Rectal BID Mansy, Jan A, MD   25 mg at 08/21/18 2332   magnesium hydroxide (MILK OF MAGNESIA) suspension 30 mL  30 mL Oral Daily PRN Mansy, Jan A, MD   30 mL at 08/21/18 2304   mirabegron ER (MYRBETRIQ) tablet 25 mg  25 mg Oral Daily Mansy, Jan A, MD   25 mg at 08/23/18 0923   montelukast (SINGULAIR) tablet 10 mg  10 mg Oral QHS Mansy, Jan A, MD   10 mg at 08/22/18 2020   multivitamin with minerals tablet 1 tablet  1 tablet Oral Daily Mansy, Jan A, MD   1 tablet at 08/23/18 0908   ondansetron (ZOFRAN) tablet 4 mg  4 mg Oral Q6H PRN Mansy, Jan A, MD       Or   ondansetron [facility]) injection 4 mg  4 mg Intravenous Q6H PRN Mansy, Jan A, MD       oxyCODONE (Oxy IR/ROXICODONE) immediate release tablet 10 mg  10 mg Oral Q4H PRN Mansy, Jan A, MD   10 mg at 08/23/18 0910   prochlorperazine (COMPAZINE) tablet 10 mg  10 mg Oral Q6H PRN Mansy, Jan A, MD       senna (SENOKOT) tablet 17.2 mg  2 tablet Oral Daily Fritzi Mandes, MD    17.2 mg at 08/23/18 0912   tamsulosin (FLOMAX) capsule 0.4 mg  0.4 mg Oral QPC supper Mansy, Jan A, MD   0.4 mg at 08/22/18 1839   traZODone (DESYREL) tablet 25 mg  25 mg Oral QHS PRN Mansy, Jan A, MD       vitamin B-12 (CYANOCOBALAMIN) tablet 2,500 mcg  2,500 mcg Oral Daily Mansy, Jan A, MD   2,500 mcg at 08/23/18 0908    VITAL SIGNS: BP (!) 156/56 (BP Location: Left Arm)    Pulse 97    Temp (!) 97.5 F (36.4 C) (Oral)    Resp 16    Ht _0  (1.549 m)    Wt 138 lb 0.1 oz (62.6 kg)  LMP  (LMP Unknown)    SpO2 98%    BMI 26.08 kg/m  Filed Weights   08/20/18 0121  Weight: 138 lb 0.1 oz (62.6 kg)    Estimated body mass index is 26.08 kg/m as calculated from the following:   Height as of this encounter: _0  (1.549 m).   Weight as of this encounter: 138 lb 0.1 oz (62.6 kg).  LABS: CBC:    Component Value Date/Time   WBC 7.5 08/21/2018 0538   HGB 7.7 (L) 08/21/2018 2311   HGB 12.9 04/30/2013 2116   HCT 18.5 (L) 08/21/2018 0538   HCT 39.1 04/30/2013 2116   PLT 140 (L) 08/21/2018 0538   PLT 229 04/30/2013 2116   MCV 106.9 (H) 08/21/2018 0538   MCV 93 04/30/2013 2116   NEUTROABS 7.5 08/20/2018 0127   LYMPHSABS 1.0 08/20/2018 0127   MONOABS 0.8 08/20/2018 0127   EOSABS 0.1 08/20/2018 0127   BASOSABS 0.1 08/20/2018 0127   Comprehensive Metabolic Panel:    Component Value Date/Time   NA 133 (L) 08/21/2018 0538   NA 140 04/30/2013 2116   K 4.2 08/21/2018 0538   K 3.8 04/30/2013 2116   CL 100 08/21/2018 0538   CL 107 04/30/2013 2116   CO2 26 08/21/2018 0538   CO2 29 04/30/2013 2116   BUN 35 (H) 08/21/2018 0538   BUN 25 (H) 04/30/2013 2116   CREATININE 2.19 (H) 08/21/2018 0538   CREATININE 1.01 04/30/2013 2116   GLUCOSE 99 08/21/2018 0538   GLUCOSE 126 (H) 04/30/2013 2116   CALCIUM 7.9 (L) 08/21/2018 0538   CALCIUM 8.9 04/30/2013 2116   AST 23 08/20/2018 0127   ALT 10 08/20/2018 0127   ALKPHOS 107 08/20/2018 0127   BILITOT 1.0 08/20/2018 0127   PROT 5.5 (L)  08/20/2018 0127   ALBUMIN 2.7 (L) 08/20/2018 0127    RADIOGRAPHIC STUDIES: Ct Abdomen Pelvis Wo Contrast  Result Date: 08/12/2018 CLINICAL DATA:  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 Worsening weakness, fatigue and lower abdominal pain. Restaging. EXAM: CT ABDOMEN AND PELVIS WITHOUT CONTRAST TECHNIQUE: Multidetector CT imaging of the abdomen and pelvis was performed following the standard protocol without IV contrast. COMPARISON:  07/09/2018 PET-CT.  01/18/2018 CT abdomen/pelvis. FINDINGS: Lower chest: Small dependent bilateral pleural effusions with dependent bibasilar atelectasis, mildly increased bilaterally. Coronary atherosclerosis. Hepatobiliary: Normal liver size. No liver mass. Cholecystectomy. No biliary ductal dilatation. Pancreas: Normal, with no mass or duct dilation. Spleen: Normal size. No mass. Adrenals/Urinary Tract: Normal adrenals. No appreciable change in severe chronic right hydroureteronephrosis to the level of the lower lumbar ureter. Stable asymmetric severe atrophy of the right renal parenchyma. No left hydronephrosis. No contour deforming renal masses. No renal stones. Irregular infiltrative solid right pelvic periureteric mass measures 5.8 x 5.1 cm at the level of the upper right pelvis (series 2/image 55), increased from 4.8 x 4.3 cm on 07/09/2018 PET-CT, and measures 5.7 x 5.0 cm at the level of the right ureterovesical junction (series 2/image 63), stable from 5.7 x 5.0 cm. No acute bladder abnormality. Stomach/Bowel: Normal non-distended stomach. Normal caliber small bowel with no small bowel wall thickening. Appendectomy. Oral contrast transits to the colon. Eccentric segmental wall thickening in the mid transverse colon (series 2/image 27) has not appreciably changed. No new sites of large bowel wall thickening. Moderate colorectal stool volume. Vascular/Lymphatic: Atherosclerotic nonaneurysmal abdominal aorta. Mildly enlarged 1.0 cm right posterior  paracaval node (series 2/image 26), increased from 0.7 cm. No additional new pathologically enlarged abdominopelvic  nodes. Reproductive: Hysterectomy.  No new adnexal masses. Other: No pneumoperitoneum or focal fluid collection. Small volume left lower quadrant ascites is mildly increased. Stable mild presacral space edema. Anasarca is increased. Musculoskeletal: No aggressive appearing focal osseous lesions. Moderate lumbar spondylosis. IMPRESSION: 1. Mild right posterior paracaval adenopathy, mildly increased. Irregular infiltrative periureteric neoplasm at the level of the upper right pelvis is increased. Findings suggest mild tumor progression. 2. Eccentric segmental wall thickening in the mid transverse colon appears unchanged. 3. Small dependent bilateral pleural effusions, mildly increased bilaterally. Small volume left lower quadrant ascites is mildly increased. Anasarca is increased. 4. Chronic right hydroureteronephrosis and right renal atrophy is unchanged. 5.  Aortic Atherosclerosis (ICD10-I70.0). Electronically Signed   By: Ilona Sorrel M.D.   On: 08/12/2018 15:48   Mr Brain Wo Contrast  Result Date: 07/30/2018 CLINICAL DATA:  Slurred speech, generalized weakness, and confusion. History of urothelial carcinoma. No IV contrast administered due to diminished renal function. EXAM: MRI HEAD WITHOUT CONTRAST TECHNIQUE: Multiplanar, multiecho pulse sequences of the brain and surrounding structures were obtained without intravenous contrast. COMPARISON:  Head CT 02/22/2015 FINDINGS: Brain: There is no evidence of acute infarct, intracranial hemorrhage, mass, midline shift, or extra-axial fluid collection. Periventricular and deep cerebral white matter T2/FLAIR hyperintensities are nonspecific but compatible with minimal chronic small vessel ischemic disease. The ventricles and sulci are within normal limits for age. Vascular: Major intracranial vascular flow voids are preserved. Skull and upper cervical  spine: No suspicious marrow lesion. Sinuses/Orbits: Bilateral cataract extraction. Mild right maxillary sinus mucosal thickening. Clear mastoid air cells. Other: 1.7 cm T2 hyperintense subcutaneous lesion in the left face, likely a sebaceous cyst. IMPRESSION: 1. No acute intracranial abnormality. 2. Minimal chronic small vessel ischemic disease. Electronically Signed   By: Logan Bores M.D.   On: 07/30/2018 18:03   US Renal  Result Date: 08/21/2018 CLINICAL DATA:  Gross hematuria. EXAM: RENAL / URINARY TRACT ULTRASOUND COMPLETE COMPARISON:  CT scan August 12, 2018 FINDINGS: Right Kidney: Renal measurements: 7.6 x 3.7 x 3.2 cm = volume: 45.9 mL. Severe hydronephrosis remains. Left Kidney: Renal measurements: 10.0 x 5.3 x 4.0 cm = volume: 111 mL. Echogenicity within normal limits. No mass or hydronephrosis visualized. Bladder: The patient has known bladder mass remains measuring 9.5 x 4.7 x 8.1 cm today. IMPRESSION: 1. Persistent severe right hydronephrosis. 2. Known right bladder mass measuring 9.5 x 4.7 x 8.1 cm today. Electronically Signed   By: Dorise Bullion III M.D   On: 08/21/2018 14:55   US Venous Img Lower Unilateral Right  Result Date: 07/25/2018 CLINICAL DATA:  Right lower extremity edema. History of bladder cancer. Evaluate for DVT. EXAM: RIGHT LOWER EXTREMITY VENOUS DOPPLER ULTRASOUND TECHNIQUE: Gray-scale sonography with graded compression, as well as color Doppler and duplex ultrasound were performed to evaluate the lower extremity deep venous systems from the level of the common femoral vein and including the common femoral, femoral, profunda femoral, popliteal and calf veins including the posterior tibial, peroneal and gastrocnemius veins when visible. The superficial great saphenous vein was also interrogated. Spectral Doppler was utilized to evaluate flow at rest and with distal augmentation maneuvers in the common femoral, femoral and popliteal veins. COMPARISON:  Right lower extremity venous  Doppler ultrasound-04/11/2018; 07/24/2014 FINDINGS: Contralateral Common Femoral Vein: Respiratory phasicity is normal and symmetric with the symptomatic side. No evidence of thrombus. Normal compressibility. Common Femoral Vein: There is hypoechoic nonocclusive DVT within the distal aspect of the right femoral vein (representative images 8 and 11). Saphenofemoral  Junction: No evidence of thrombus. Normal compressibility and flow on color Doppler imaging. Profunda Femoral Vein: There is hypoechoic occlusive thrombus within the interrogated portions the right deep femoral vein (image 18). Femoral Vein: There is hypoechoic nonocclusive thrombus within the proximal (image 20) and mid (image 23) aspects of the right femoral vein. The distal aspect the right femoral vein appears patent. Popliteal Vein: No evidence of thrombus. Normal compressibility, respiratory phasicity and response to augmentation. Calf Veins: No evidence of thrombus. Normal compressibility and flow on color Doppler imaging. Superficial Great Saphenous Vein: No evidence of thrombus. Normal compressibility. Venous Reflux:  None. Other Findings: Moderate amount of subcutaneous edema is noted at the level of the right lower leg and calf. IMPRESSION: Examination is positive for occlusive DVT involving the right deep femoral vein. Nonocclusive thrombus is also seen within the right common femoral vein as well as the proximal and mid aspects of the right superficial femoral vein. Electronically Signed   By: Sandi Mariscal M.D.   On: 07/25/2018 16:04    PERFORMANCE STATUS (ECOG) : 3 - Symptomatic, >50% confined to bed  Review of Systems Unless otherwise noted, a complete review of systems is negative.  Physical Exam General: NAD, frail appearing, thin Pulmonary: clear ant fields GU: yellow urine Foley Extremities: no edema, no joint deformities Skin: no rashes Neurological: Weakness but otherwise nonfocal  IMPRESSION: Patient is comfortable  appearing today. Pleasantly confused (disoriented to place/situation) but likely delirious from hospitalization and medications. Hopefully, this will improve when patient returns home. Patient denies pain. No bleeding reported overnight.   I called and spoke with her daughter. Plan is for patient to return home with hospice care later today.   Case discussed with Dr. Posey Pronto, RN, and hospice liaison.   PLAN: -Best supportive care -Home with hospice     Time Total: 15 minutes  Visit consisted of counseling and education dealing with the complex and emotionally intense issues of symptom management and palliative care in the setting of serious and potentially life-threatening illness.Greater than 50%  of this time was spent counseling and coordinating care related to the above assessment and plan.  Signed by: Altha Harm, PhD, NP-C [phone] (Work Cell)

## 2018-08-23 NOTE — Progress Notes (Signed)
Urology Consult Follow Up  Subjective: Kristi Thompson is resting comfortably.  CBI on slow titration.  Efflux is pale yellow clear.  Anti-infectives: Anti-infectives (From admission, onward)   Start     Dose/Rate Route Frequency Ordered Stop   08/20/18 1000  sulfamethoxazole-trimethoprim (BACTRIM DS) 800-160 MG per tablet 1 tablet  Status:  Discontinued     1 tablet Oral 2 times daily 08/20/18 0631 08/20/18 0702   08/20/18 0515  cephALEXin (KEFLEX) capsule 500 mg     500 mg Oral  Once 08/20/18 0500 08/20/18 0518      Current Facility-Administered Medications  Medication Dose Route Frequency Provider Last Rate Last Dose  . acetaminophen (TYLENOL) tablet 650 mg  650 mg Oral Q6H PRN Mansy, Jan A, MD       Or  . acetaminophen (TYLENOL) suppository 650 mg  650 mg Rectal Q6H PRN Mansy, Jan A, MD      . albuterol (PROVENTIL) (2.5 MG/3ML) 0.083% nebulizer solution 3 mL  3 mL Inhalation Q4H PRN Mansy, Jan A, MD      . budesonide (PULMICORT) nebulizer solution 0.25 mg  2 mL Inhalation BID Mansy, Jan A, MD   0.25 mg at 08/23/18 0746  . docusate sodium (COLACE) capsule 100 mg  100 mg Oral BID Fritzi Mandes, MD   100 mg at 08/22/18 2021  . famotidine (PEPCID) tablet 10 mg  10 mg Oral BID Mansy, Jan A, MD   10 mg at 08/22/18 2020  . fentaNYL (DURAGESIC) 25 MCG/HR 1 patch  1 patch Transdermal Q72H Mansy, Jan A, MD   1 patch at 08/22/18 1006  . fluticasone (FLONASE) 50 MCG/ACT nasal spray 2 spray  2 spray Each Nare QHS Mansy, Arvella Merles, MD   2 spray at 08/22/18 2021  . HYDROcodone-acetaminophen (NORCO/VICODIN) 5-325 MG per tablet 1 tablet  1 tablet Oral Q6H PRN Mansy, Jan A, MD      . hydrocortisone (ANUSOL-HC) suppository 25 mg  25 mg Rectal BID Mansy, Jan A, MD   25 mg at 08/21/18 2332  . magnesium hydroxide (MILK OF MAGNESIA) suspension 30 mL  30 mL Oral Daily PRN Mansy, Jan A, MD   30 mL at 08/21/18 2304  . mirabegron ER (MYRBETRIQ) tablet 25 mg  25 mg Oral Daily Mansy, Jan A, MD   25 mg at 08/22/18 1006  .  montelukast (SINGULAIR) tablet 10 mg  10 mg Oral QHS Mansy, Jan A, MD   10 mg at 08/22/18 2020  . multivitamin with minerals tablet 1 tablet  1 tablet Oral Daily Mansy, Jan A, MD   1 tablet at 08/22/18 1006  . ondansetron (ZOFRAN) tablet 4 mg  4 mg Oral Q6H PRN Mansy, Jan A, MD       Or  . ondansetron [facility]) injection 4 mg  4 mg Intravenous Q6H PRN Mansy, Jan A, MD      . oxyCODONE (Oxy IR/ROXICODONE) immediate release tablet 10 mg  10 mg Oral Q4H PRN Mansy, Jan A, MD   10 mg at 08/23/18 0441  . prochlorperazine (COMPAZINE) tablet 10 mg  10 mg Oral Q6H PRN Mansy, Jan A, MD      . senna (SENOKOT) tablet 17.2 mg  2 tablet Oral Daily Fritzi Mandes, MD   17.2 mg at 08/22/18 1006  . tamsulosin (FLOMAX) capsule 0.4 mg  0.4 mg Oral QPC supper Mansy, Jan A, MD   0.4 mg at 08/22/18 1839  . traZODone (DESYREL) tablet 25 mg  25 mg Oral  QHS PRN Mansy, Jan A, MD      . vitamin B-12 (CYANOCOBALAMIN) tablet 2,500 mcg  2,500 mcg Oral Daily Mansy, Jan A, MD   2,500 mcg at 08/22/18 1006     Objective: Vital signs in last 24 hours: Temp:  [97 F (36.1 C)-98.7 F (37.1 C)] 97.5 F (36.4 C) (06/12 0445) Pulse Rate:  [57-97] 97 (06/12 0445) Resp:  [10-19] 16 (06/12 0445) BP: (120-164)/(55-73) 156/56 (06/12 0445) SpO2:  [97 %-100 %] 98 % (06/12 0445)  Intake/Output from previous day: 06/11 0701 - 06/12 0700 In: 800 [I.V.:300] Out: 1700 [Urine:1700] Intake/Output this shift: No intake/output data recorded.   Physical Exam Constitutional:  Well nourished. Pleasantly confused.  No acute distress. HEENT: [place] AT, moist mucus membranes.  Trachea midline, no masses. Cardiovascular: No clubbing, cyanosis, or edema. Respiratory: Normal respiratory effort, no increased work of breathing. GI: Abdomen is soft, non tender, non distended GU: No CVA tenderness.  No bladder fullness or masses.   Neurologic: Grossly intact, no focal deficits, moving all 4 extremities. Psychiatric: Normal mood and affect.  Lab  Results:  Recent Labs    08/21/18 0538 08/21/18 1645 08/21/18 2311  WBC 7.5  --   --   HGB 6.0* 8.4* 7.7*  HCT 18.5*  --   --   PLT 140*  --   --    BMET Recent Labs    08/21/18 0538  NA 133*  K 4.2  CL 100  CO2 26  GLUCOSE 99  BUN 35*  CREATININE 2.19*  CALCIUM 7.9*   PT/INR No results for input(s): LABPROT, INR in the last 72 hours. ABG No results for input(s): PHART, HCO3 in the last 72 hours.  Invalid input(s): PCO2, PO2  Studies/Results: US Renal  Result Date: 08/21/2018 CLINICAL DATA:  Gross hematuria. EXAM: RENAL / URINARY TRACT ULTRASOUND COMPLETE COMPARISON:  CT scan August 12, 2018 FINDINGS: Right Kidney: Renal measurements: 7.6 x 3.7 x 3.2 cm = volume: 45.9 mL. Severe hydronephrosis remains. Left Kidney: Renal measurements: 10.0 x 5.3 x 4.0 cm = volume: 111 mL. Echogenicity within normal limits. No mass or hydronephrosis visualized. Bladder: The patient has known bladder mass remains measuring 9.5 x 4.7 x 8.1 cm today. IMPRESSION: 1. Persistent severe right hydronephrosis. 2. Known right bladder mass measuring 9.5 x 4.7 x 8.1 cm today. Electronically Signed   By: Dorise Bullion III M.D   On: 08/21/2018 14:55     Assessment and Plan s/p Procedure(s): CYSTOSCOPY WITH FULGERATION  1. Gross hematuria S/P cystoscopy with fulguration and clot evacuation 08/22/2018 Urine is light yellow clear on very slow drip- CBI turned off this am - if remains clear to very light pink may discontinue the Foley   2. Metastatic urothelial carcinoma Plans for discharge with hospice     LOS: 3 days    [facility] 08/23/2018

## 2018-08-24 LAB — TYPE AND SCREEN
ABO/RH(D): AB POS
Antibody Screen: NEGATIVE
Unit division: 0
Unit division: 0
Unit division: 0

## 2018-08-24 LAB — BPAM RBC
Blood Product Expiration Date: 202006132359
Blood Product Expiration Date: 202006172359
Blood Product Expiration Date: 202006262359
ISSUE DATE / TIME: 202006101052
Unit Type and Rh: 600
Unit Type and Rh: 6200
Unit Type and Rh: 6200

## 2018-08-24 LAB — PREPARE RBC (CROSSMATCH)

## 2018-08-26 ENCOUNTER — Telehealth: Payer: Self-pay | Admitting: *Deleted

## 2018-08-26 NOTE — Telephone Encounter (Signed)
Hospice nurse Colletta [place] called to report that at 4:30 this morning as daughter was assiting patient up, she fell and hit her left face and it is bruised and painful. She is taking her Oxycodone for the pain. Also Hospice does NOT cover her Pulmicort and they are requesting stopping that and starting her on Prednisone 1 mg daily instead. Please advise

## 2018-08-26 NOTE — Telephone Encounter (Signed)
I tried calling hospice nurse but did not reach her. Okay with switching to prednisone. Could also consider duonebs if symptomatic. Recommend monitoring neuro status post fall. Please assess for DME needs to lessen fall risk. Continue oxycodone prn. Let us know if pain is poorly controlled on prn meds.

## 2018-08-26 NOTE — Telephone Encounter (Signed)
Orders called to [place] who repeated back to me

## 2018-09-03 ENCOUNTER — Telehealth: Payer: Self-pay | Admitting: *Deleted

## 2018-09-11 NOTE — Telephone Encounter (Signed)
Jennifer with hospice called to report tht the patient died at 9:29 AM this morning

## 2018-09-11 DEATH — deceased

## 2020-06-18 IMAGING — CT CT ABD-PELV W/O CM
2 of 4 series · 15 of 46 positions shown, 17 images · non-contrast
Comparison: None

CLINICAL DATA: Lower abdominal and BILATERAL flank pain for 3
weeks, history of hysterectomy, cholecystectomy, colon resection,
appendectomy, colon cancer, COPD, GERD

EXAM:
CT ABDOMEN AND PELVIS WITHOUT CONTRAST
TECHNIQUE: Multidetector CT imaging of the abdomen and pelvis was performed
following the standard protocol without IV contrast. Sagittal and
coronal MPR images reconstructed from axial data set. Patient drank
dilute oral contrast for exam

[Series 2: routine abdomen pelvis without · axial · non-contrast · 0.67mm/px · z∈[-1621,-1216]mm · 12 of 89 slices shown, 14 images (1 of 2)]
[im 4/89  soft-tissue]
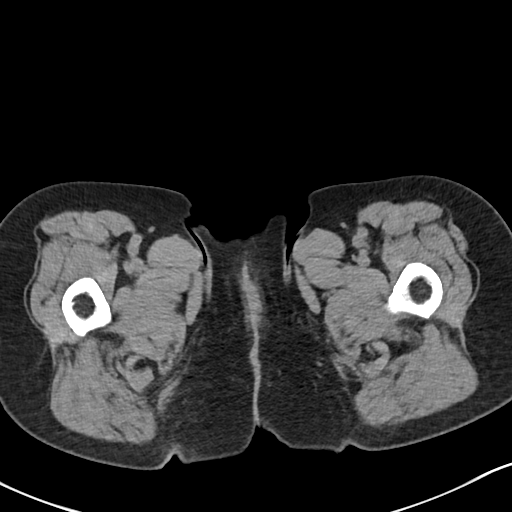
[im 4/89  bone]
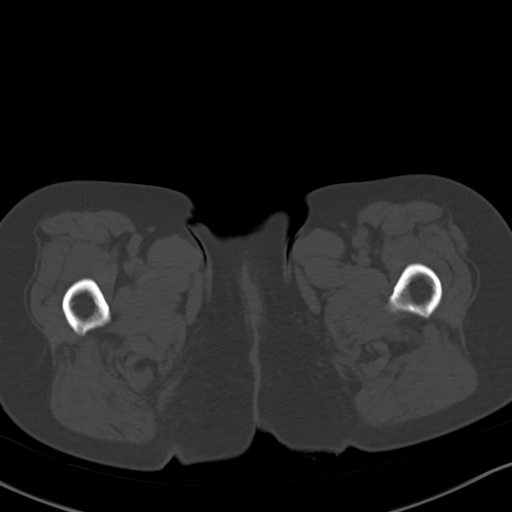
[im 12/89  soft-tissue]
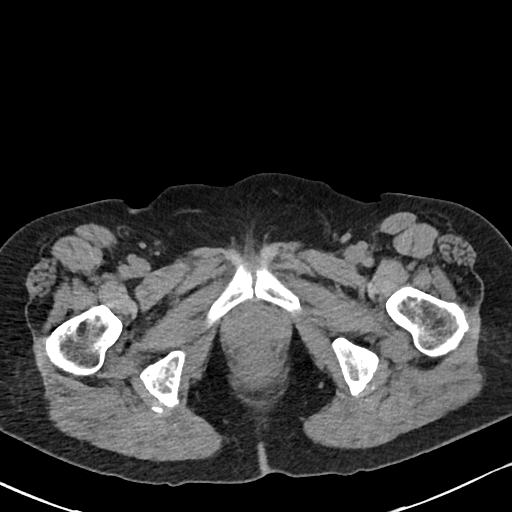
[im 19/89  soft-tissue]
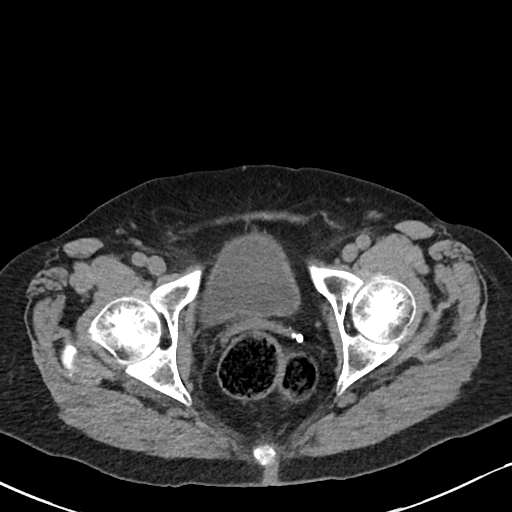
[im 26/89  soft-tissue]
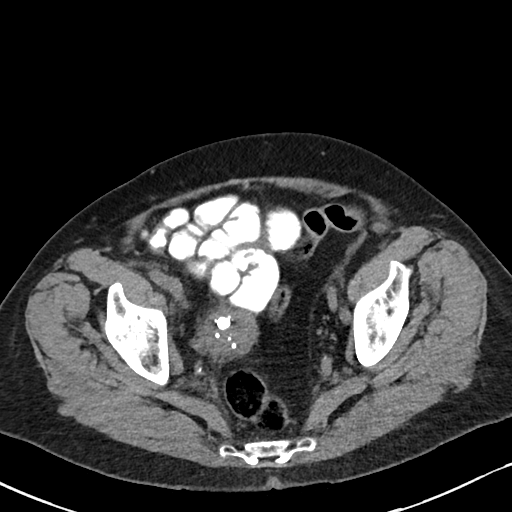
[im 34/89  soft-tissue]
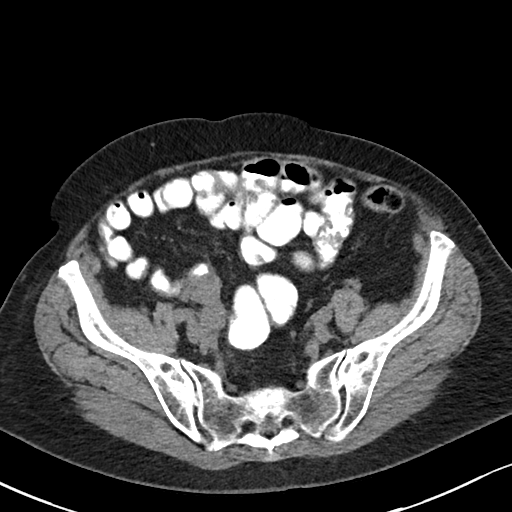
[im 41/89  soft-tissue]
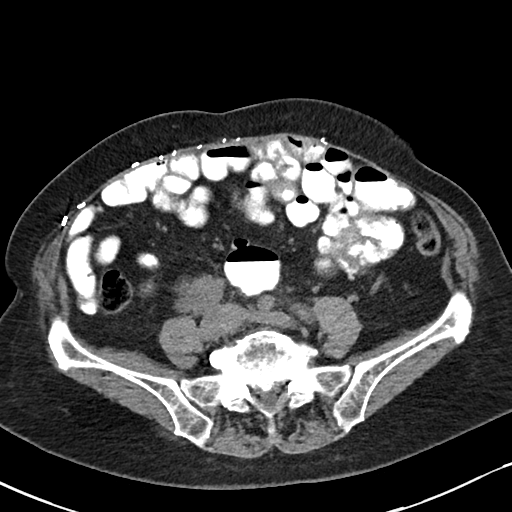
[im 48/89  soft-tissue]
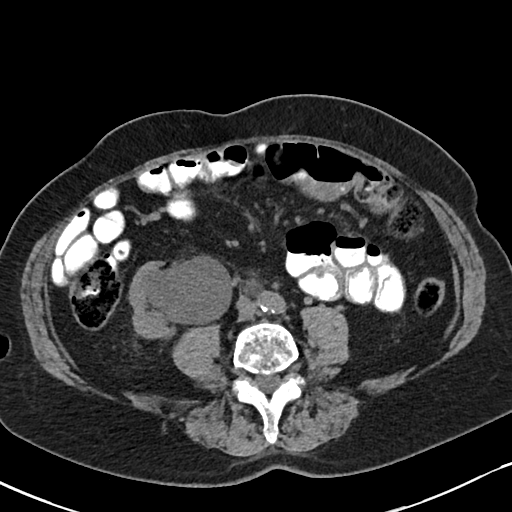
[im 56/89  soft-tissue]
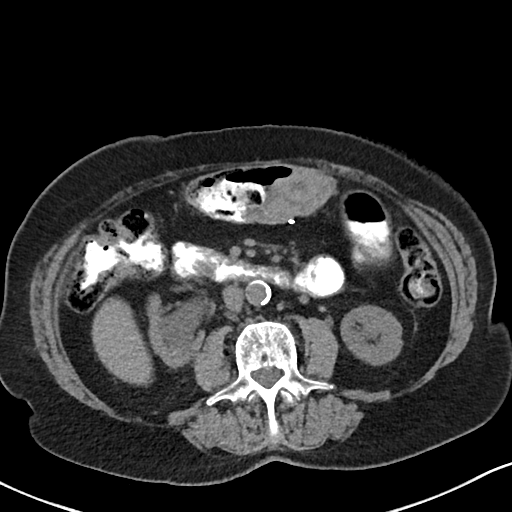
[im 63/89  soft-tissue]
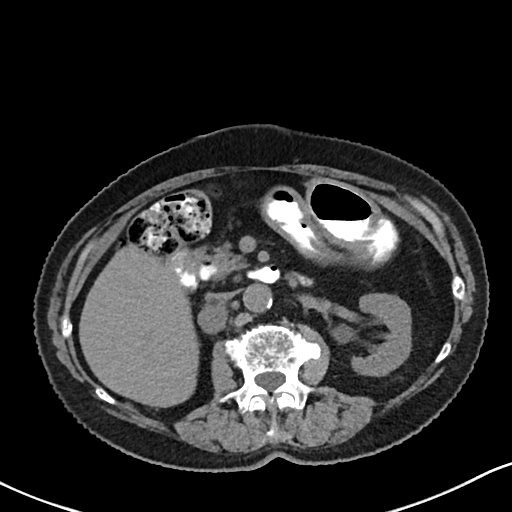
[im 63/89  bone]
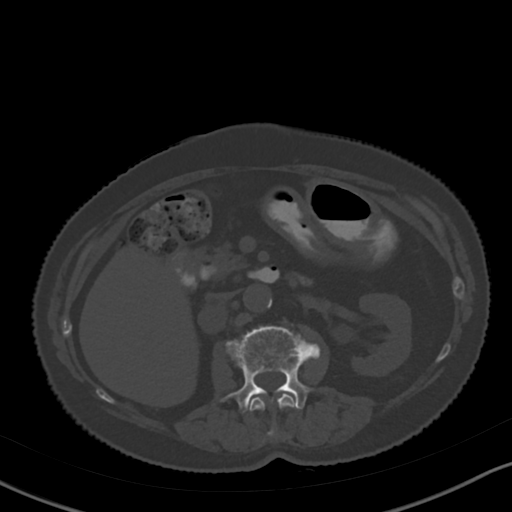
[im 70/89  soft-tissue]
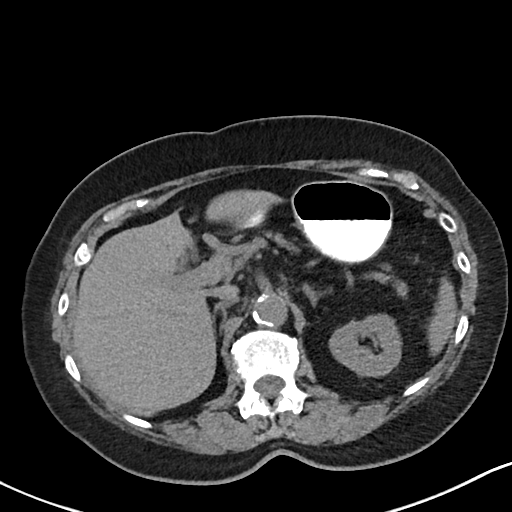
[im 78/89  soft-tissue]
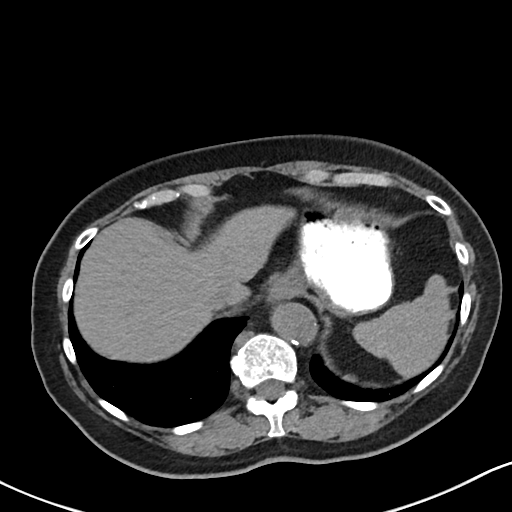
[im 85/89  soft-tissue]
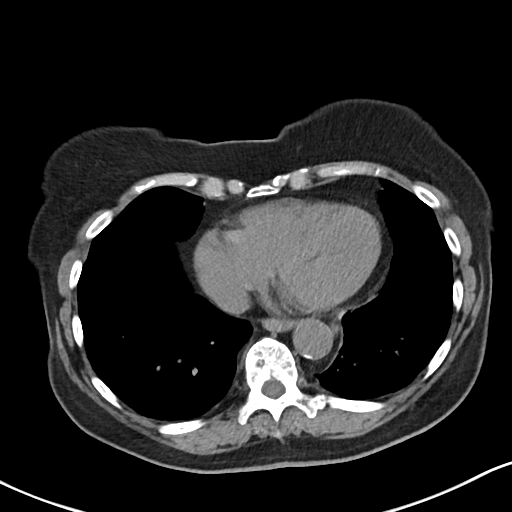

[Series 4: routine abdomen pelvis without · coronal · non-contrast · 0.67mm/px · 3 of 134 slices shown (2 of 2)]
[im 45/134  soft-tissue]
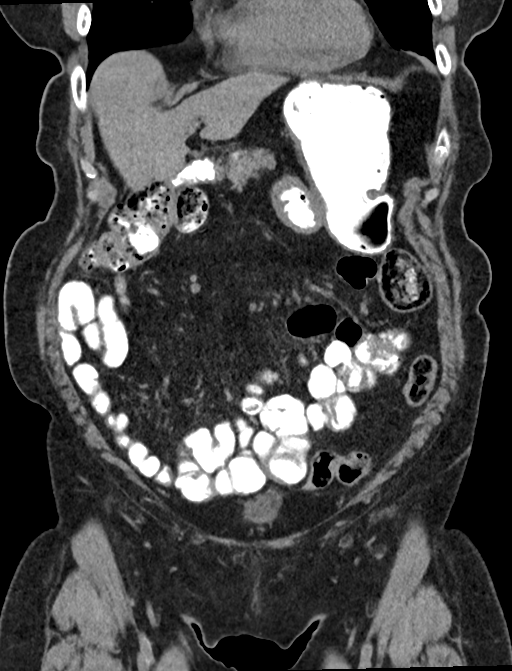
[im 60/134  soft-tissue]
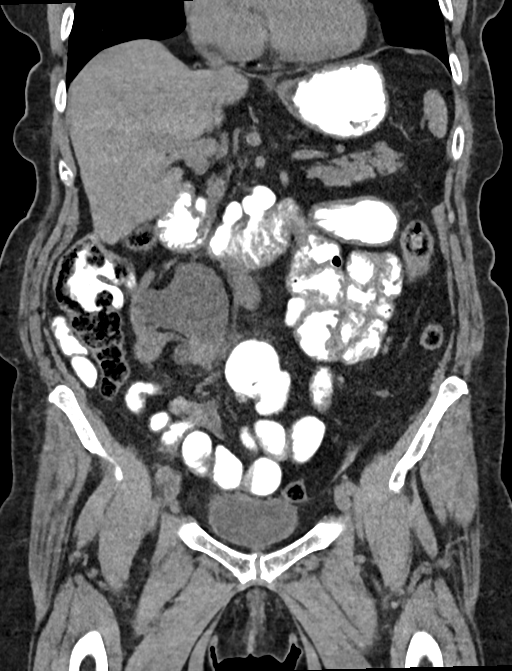
[im 74/134  soft-tissue]
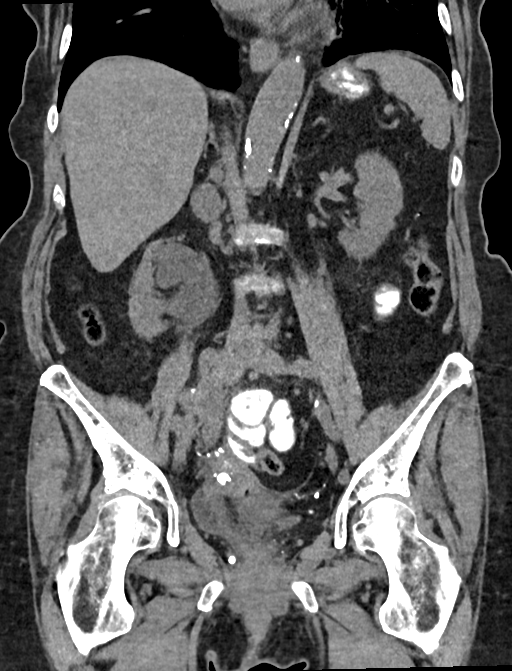

[15 of 46 positions shown; findings below may reference images not displayed]

FINDINGS: Lower chest: Minimal atelectasis at LEFT base. Lung bases appear
hyperinflated.

Hepatobiliary: Liver and gallbladder normal appearance

Pancreas: Atrophic pancreas without mass

Spleen: Normal appearance

Adrenals/Urinary Tract: Adrenal glands and LEFT kidney normal
appearance. RIGHT hydronephrosis and proximal to mid ureteral
dilatation appendix surgically absent by history. RIGHT ureter is
dilated into the RIGHT pelvis where it terminates at a soft tissue
mass, see below. Unremarkable LEFT ureter and bladder.

Stomach/Bowel: Stomach unremarkable. Area of soft tissue attenuation
at the mid transverse colon suspicious for colonic neoplasm, 3.0 x
4.5 x 5.1 cm. Remaining bowel loops unremarkable

Vascular/Lymphatic: Atherosclerotic calcifications aorta and iliac
arteries, minimally in coronary arteries. Aorta normal caliber. 18
mm aortocaval lymph node image 40. Additional abnormal soft tissue
density in the RIGHT retroperitoneum adjacent to and along the
course of the RIGHT ureter suspicious for additional retroperitoneal
adenopathy, up to 16 mm short axis image 47. Numerous pelvic
phleboliths.

Reproductive: Uterus surgically absent. Atrophic LEFT ovary. Soft
tissue mass with central calcifications in the RIGHT pelvis could
represent an abnormal enlarged RIGHT ovary 3.9 x 3.6 x 2.9 cm versus
metastatic disease to the RIGHT ovary versus non lymph node tumor.
This is likely causing the observed distal RIGHT ureteral
obstruction.

Other: No free air or free fluid.  No definite hernia.

Musculoskeletal: Bones demineralized with bulging L5-S1 disc.
IMPRESSION: Abnormal soft tissue density at the mid transverse colon suspicious
for colon cancer, 3.0 x 4.5 x 5.1 cm, consider colonoscopy..

Retroperitoneal adenopathy concerning for metastatic disease/tumor
spread, including soft tissue mass in the RIGHT pelvis 3.9 cm
greatest size which could represent tumor or metastatic disease to
the RIGHT ovary.

RIGHT hydronephrosis and RIGHT hydroureter secondary to distal RIGHT
ureteral obstruction at the level of the above soft tissue mass in
the RIGHT pelvis.

Extent of disease could be assessed by PET-CT imaging.

## 2020-11-22 IMAGING — US RIGHT LOWER EXTREMITY VENOUS ULTRASOUND
1 series · 13 of 24 positions shown · non-contrast
Comparison: Right lower extremity venous Doppler
ultrasound-04/11/2018; 07/24/2014

CLINICAL DATA: Right lower extremity edema. History of bladder
cancer. Evaluate for DVT.



[Series 1: right lower extremity venous ultrasound · 0.07mm/px · 13 of 41 slices shown]
[im 1/41]
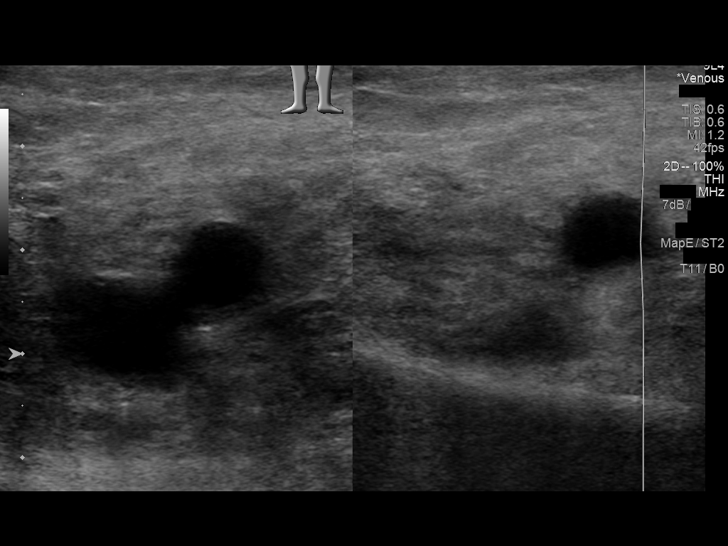
[im 4/41]
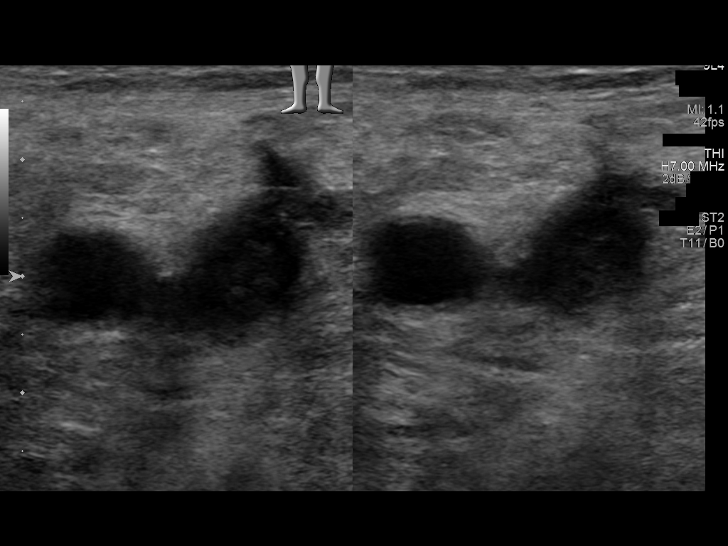
[im 7/41]
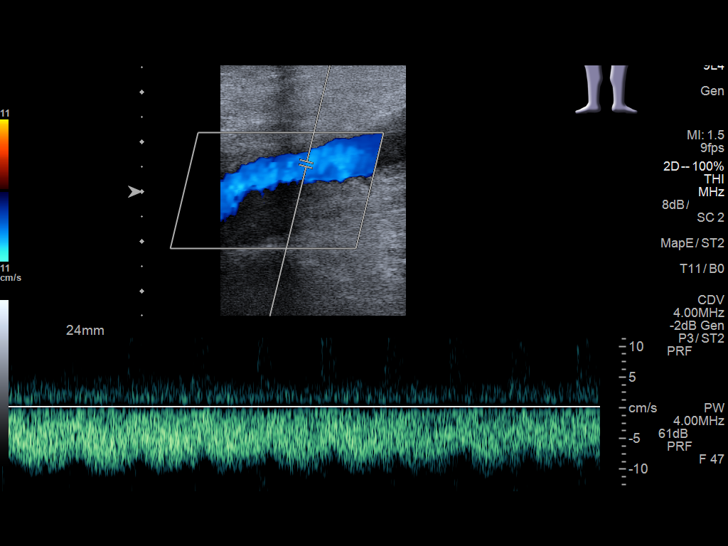
[im 11/41]
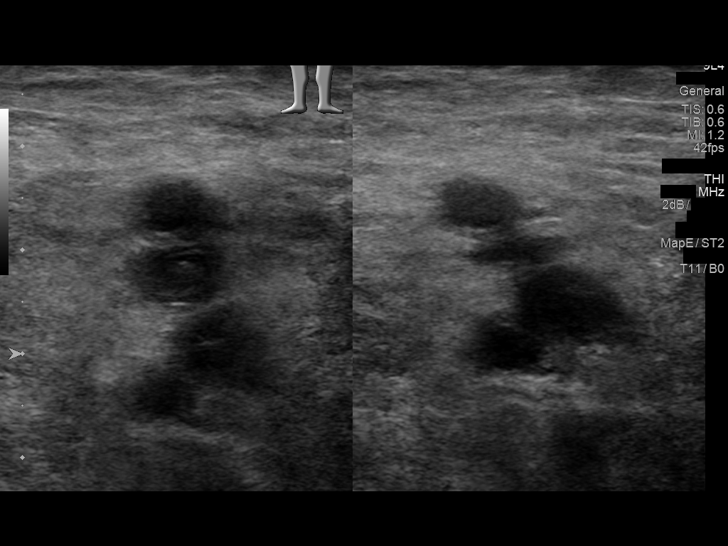
[im 14/41]
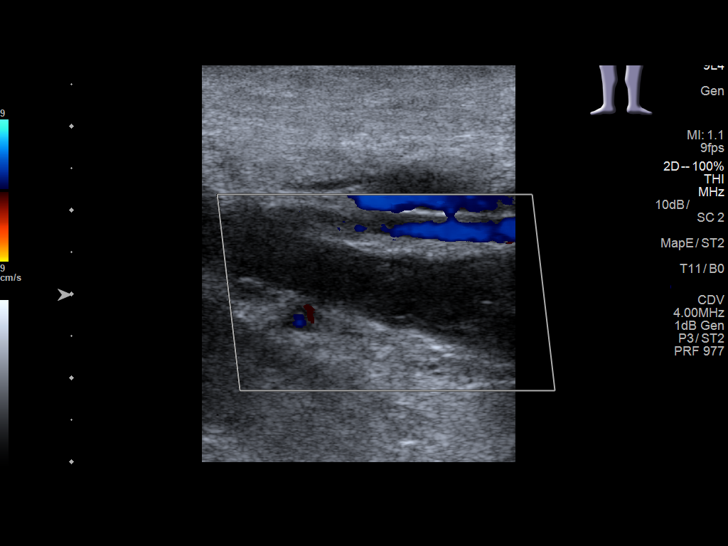
[im 18/41]
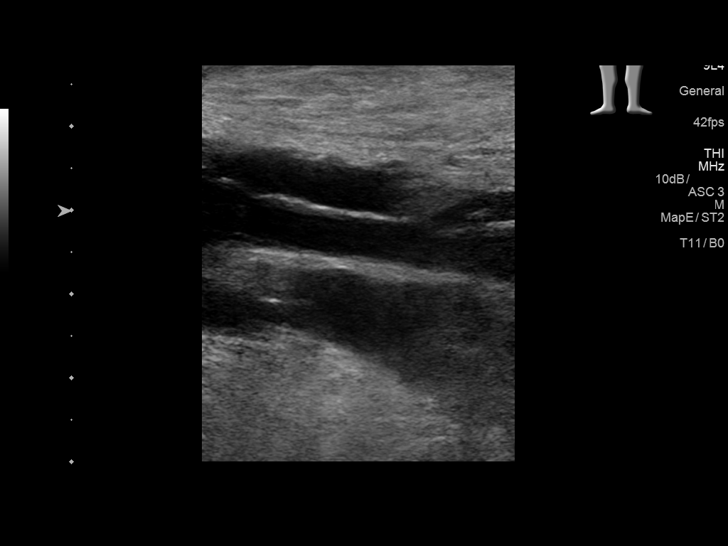
[im 21/41]
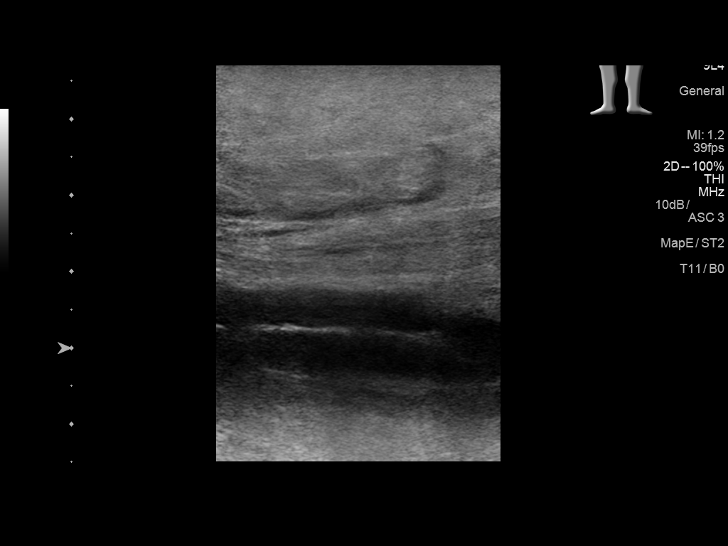
[im 23/41]
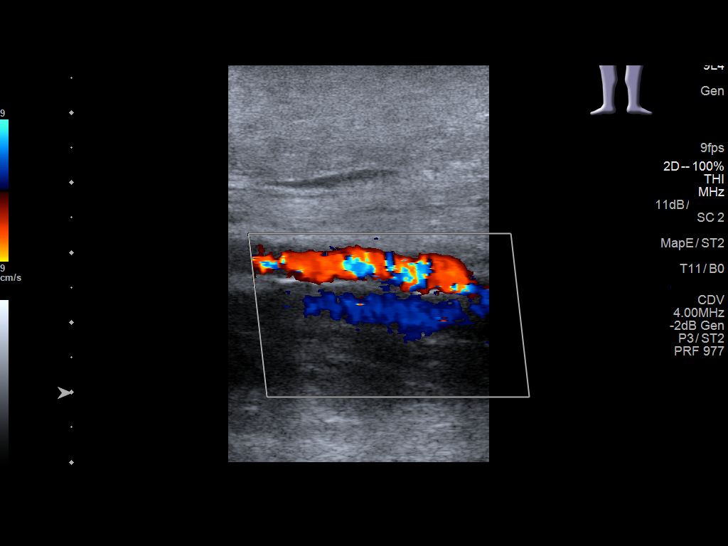
[im 27/41]
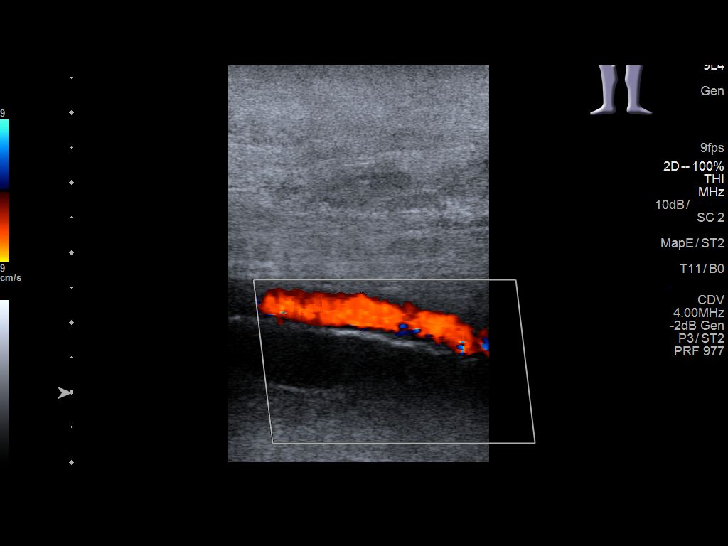
[im 30/41]
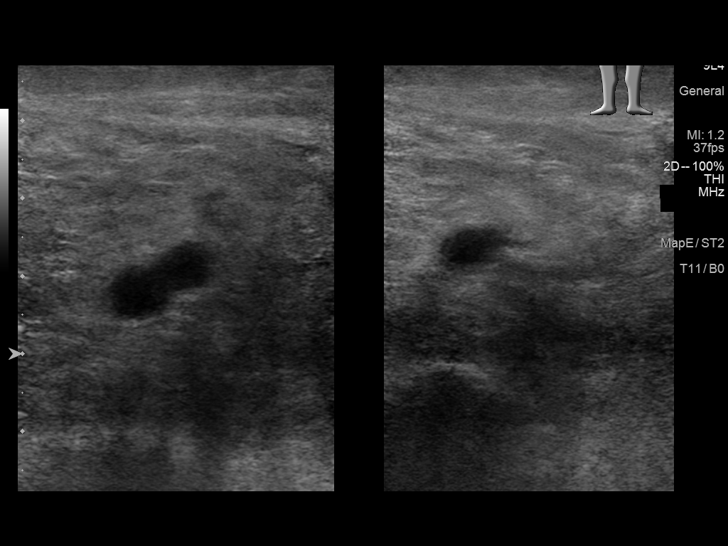
[im 34/41]
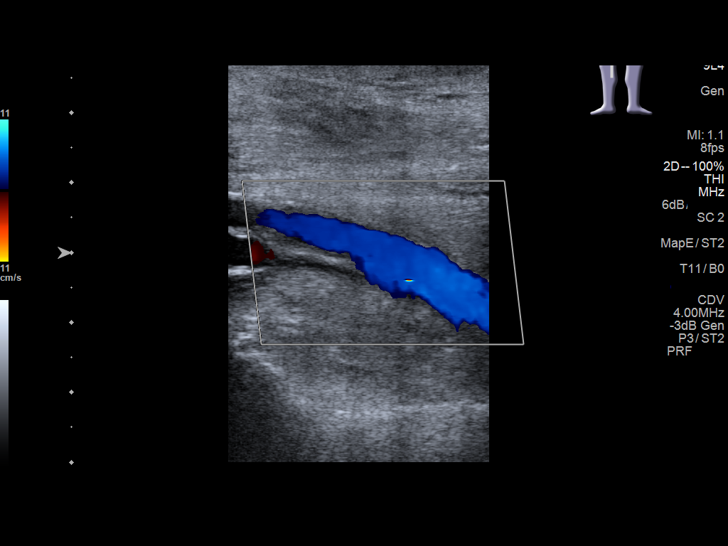
[im 37/41]
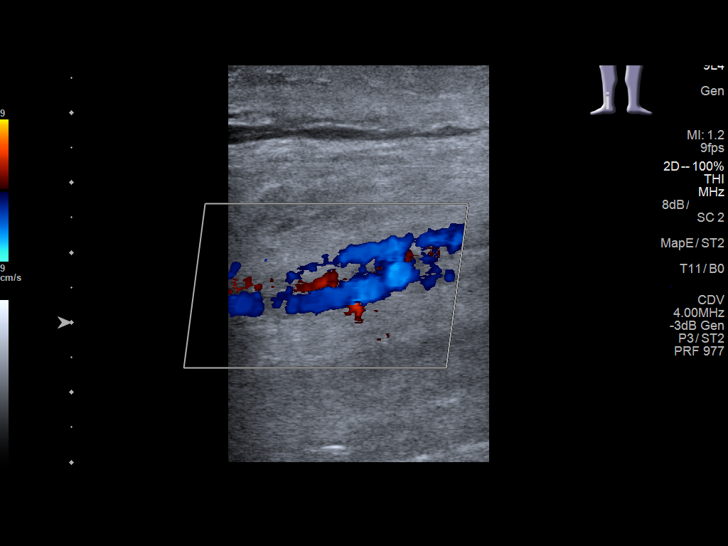
[im 41/41]
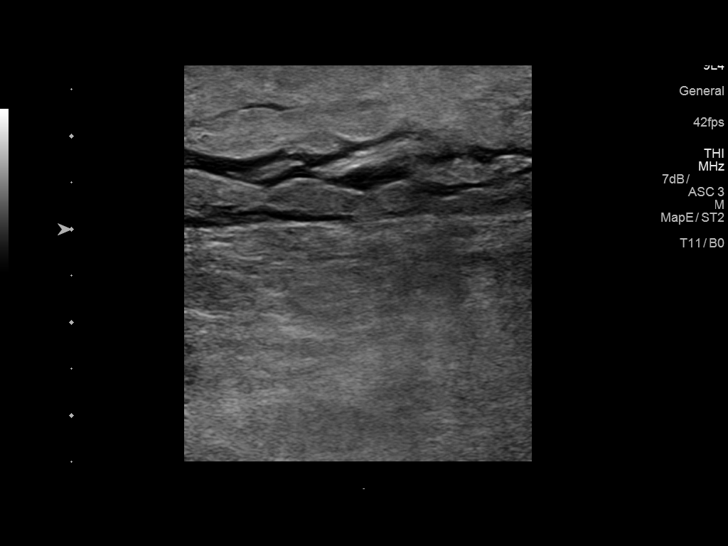

[13 of 24 positions shown; findings below may reference images not displayed]

FINDINGS: Contralateral Common Femoral Vein: Respiratory phasicity is normal
and symmetric with the symptomatic side. No evidence of thrombus.
Normal compressibility.

Common Femoral Vein: There is hypoechoic nonocclusive DVT within the
distal aspect of the right femoral vein (representative images 8 and
11).

Saphenofemoral Junction: No evidence of thrombus. Normal
compressibility and flow on color Doppler imaging.

Profunda Femoral Vein: There is hypoechoic occlusive thrombus within
the interrogated portions the right deep femoral vein (image 18).

Femoral Vein: There is hypoechoic nonocclusive thrombus within the
proximal (image 20) and mid (image 23) aspects of the right femoral
vein. The distal aspect the right femoral vein appears patent.

Popliteal Vein: No evidence of thrombus. Normal compressibility,
respiratory phasicity and response to augmentation.

Calf Veins: No evidence of thrombus. Normal compressibility and flow
on color Doppler imaging.

Superficial Great Saphenous Vein: No evidence of thrombus. Normal
compressibility.

Venous Reflux:  None.

Other Findings: Moderate amount of subcutaneous edema is noted at
the level of the right lower leg and calf.
IMPRESSION: Examination is positive for occlusive DVT involving the right deep
femoral vein. Nonocclusive thrombus is also seen within the right
common femoral vein as well as the proximal and mid aspects of the
right superficial femoral vein.
